# Patient Record
Sex: Female | Born: 1955
Health system: Southern US, Community
[De-identification: ages and names within clinical notes are randomized; demographics above are authoritative.]

## PROBLEM LIST (undated history)

## (undated) DIAGNOSIS — R7303 Prediabetes: Secondary | ICD-10-CM

## (undated) DIAGNOSIS — G459 Transient cerebral ischemic attack, unspecified: Secondary | ICD-10-CM

## (undated) DIAGNOSIS — K219 Gastro-esophageal reflux disease without esophagitis: Secondary | ICD-10-CM

## (undated) DIAGNOSIS — R079 Chest pain, unspecified: Secondary | ICD-10-CM

## (undated) DIAGNOSIS — I10 Essential (primary) hypertension: Secondary | ICD-10-CM

## (undated) DIAGNOSIS — R200 Anesthesia of skin: Secondary | ICD-10-CM

## (undated) DIAGNOSIS — E559 Vitamin D deficiency, unspecified: Secondary | ICD-10-CM

## (undated) DIAGNOSIS — K2970 Gastritis, unspecified, without bleeding: Secondary | ICD-10-CM

## (undated) DIAGNOSIS — K635 Polyp of colon: Secondary | ICD-10-CM

## (undated) DIAGNOSIS — I872 Venous insufficiency (chronic) (peripheral): Secondary | ICD-10-CM

## (undated) DIAGNOSIS — R5383 Other fatigue: Secondary | ICD-10-CM

## (undated) DIAGNOSIS — E669 Obesity, unspecified: Secondary | ICD-10-CM

## (undated) DIAGNOSIS — R071 Chest pain on breathing: Secondary | ICD-10-CM

## (undated) DIAGNOSIS — N959 Unspecified menopausal and perimenopausal disorder: Secondary | ICD-10-CM

## (undated) DIAGNOSIS — K2270 Barrett's esophagus without dysplasia: Secondary | ICD-10-CM

## (undated) DIAGNOSIS — M549 Dorsalgia, unspecified: Secondary | ICD-10-CM

## (undated) DIAGNOSIS — B9681 Helicobacter pylori [H. pylori] as the cause of diseases classified elsewhere: Secondary | ICD-10-CM

## (undated) DIAGNOSIS — Z8701 Personal history of pneumonia (recurrent): Secondary | ICD-10-CM

## (undated) DIAGNOSIS — R519 Headache, unspecified: Secondary | ICD-10-CM

## (undated) DIAGNOSIS — Q676 Pectus excavatum: Secondary | ICD-10-CM

## (undated) DIAGNOSIS — K5790 Diverticulosis of intestine, part unspecified, without perforation or abscess without bleeding: Secondary | ICD-10-CM

## (undated) DIAGNOSIS — S92901A Unspecified fracture of right foot, initial encounter for closed fracture: Secondary | ICD-10-CM

## (undated) DIAGNOSIS — M7989 Other specified soft tissue disorders: Secondary | ICD-10-CM

## (undated) DIAGNOSIS — I639 Cerebral infarction, unspecified: Secondary | ICD-10-CM

## (undated) HISTORY — DX: Other specified soft tissue disorders: M79.89

## (undated) HISTORY — DX: Polyp of colon: K63.5

## (undated) HISTORY — DX: Anesthesia of skin: R20.0

## (undated) HISTORY — PX: APPENDECTOMY: SHX54

## (undated) HISTORY — DX: Personal history of pneumonia (recurrent): Z87.01

## (undated) HISTORY — DX: Venous insufficiency (chronic) (peripheral): I87.2

## (undated) HISTORY — DX: Chest pain, unspecified: R07.9

## (undated) HISTORY — DX: Helicobacter pylori (H. pylori) as the cause of diseases classified elsewhere: K29.70

## (undated) HISTORY — DX: Prediabetes: R73.03

## (undated) HISTORY — DX: Other fatigue: R53.83

## (undated) HISTORY — DX: Essential (primary) hypertension: I10

## (undated) HISTORY — DX: Barrett's esophagus without dysplasia: K22.70

## (undated) HISTORY — DX: Unspecified fracture of right foot, initial encounter for closed fracture: S92.901A

## (undated) HISTORY — PX: BREAST BIOPSY: SHX20

## (undated) HISTORY — DX: Cerebral infarction, unspecified: I63.9

## (undated) HISTORY — DX: Helicobacter pylori (H. pylori) as the cause of diseases classified elsewhere: B96.81

## (undated) HISTORY — DX: Vitamin D deficiency, unspecified: E55.9

## (undated) HISTORY — DX: Diverticulosis of intestine, part unspecified, without perforation or abscess without bleeding: K57.90

## (undated) HISTORY — PX: OVARIAN CYST REMOVAL: SHX89

## (undated) HISTORY — DX: Dorsalgia, unspecified: M54.9

## (undated) HISTORY — DX: Gastro-esophageal reflux disease without esophagitis: K21.9

## (undated) HISTORY — DX: Unspecified menopausal and perimenopausal disorder: N95.9

## (undated) HISTORY — DX: Transient cerebral ischemic attack, unspecified: G45.9

## (undated) HISTORY — DX: Pectus excavatum: Q67.6

## (undated) HISTORY — PX: BREAST EXCISIONAL BIOPSY: SUR124

## (undated) HISTORY — DX: Headache, unspecified: R51.9

## (undated) HISTORY — DX: Chest pain on breathing: R07.1

## (undated) HISTORY — DX: Obesity, unspecified: E66.9

---

## 2003-06-11 ENCOUNTER — Other Ambulatory Visit: Admission: RE | Admit: 2003-06-11 | Discharge: 2003-06-11 | Payer: Self-pay | Admitting: Gynecology

## 2003-09-22 DIAGNOSIS — R0789 Other chest pain: Secondary | ICD-10-CM

## 2003-09-22 HISTORY — DX: Other chest pain: R07.89

## 2004-09-21 HISTORY — PX: CARDIAC CATHETERIZATION: SHX172

## 2004-09-21 HISTORY — PX: OTHER SURGICAL HISTORY: SHX169

## 2006-03-22 ENCOUNTER — Encounter: Admission: RE | Admit: 2006-03-22 | Discharge: 2006-04-09 | Payer: Self-pay | Admitting: Neurology

## 2006-08-17 ENCOUNTER — Ambulatory Visit: Payer: Self-pay | Admitting: Physical Medicine & Rehabilitation

## 2006-08-17 ENCOUNTER — Encounter
Admission: RE | Admit: 2006-08-17 | Discharge: 2006-11-15 | Payer: Self-pay | Admitting: Physical Medicine & Rehabilitation

## 2006-10-12 ENCOUNTER — Ambulatory Visit: Payer: Self-pay | Admitting: Physical Medicine & Rehabilitation

## 2006-10-12 ENCOUNTER — Encounter
Admission: RE | Admit: 2006-10-12 | Discharge: 2007-01-10 | Payer: Self-pay | Admitting: Physical Medicine & Rehabilitation

## 2007-01-06 ENCOUNTER — Ambulatory Visit: Payer: Self-pay | Admitting: Physical Medicine & Rehabilitation

## 2007-12-08 ENCOUNTER — Encounter: Payer: Self-pay | Admitting: Internal Medicine

## 2009-04-16 ENCOUNTER — Telehealth (INDEPENDENT_AMBULATORY_CARE_PROVIDER_SITE_OTHER): Payer: Self-pay | Admitting: *Deleted

## 2009-05-06 ENCOUNTER — Ambulatory Visit: Payer: Self-pay | Admitting: Internal Medicine

## 2009-05-06 DIAGNOSIS — R071 Chest pain on breathing: Secondary | ICD-10-CM | POA: Insufficient documentation

## 2009-05-06 DIAGNOSIS — K5730 Diverticulosis of large intestine without perforation or abscess without bleeding: Secondary | ICD-10-CM | POA: Insufficient documentation

## 2009-05-06 DIAGNOSIS — Z8709 Personal history of other diseases of the respiratory system: Secondary | ICD-10-CM | POA: Insufficient documentation

## 2009-05-06 DIAGNOSIS — I10 Essential (primary) hypertension: Secondary | ICD-10-CM | POA: Insufficient documentation

## 2009-05-06 DIAGNOSIS — Z860100 Personal history of colon polyps, unspecified: Secondary | ICD-10-CM | POA: Insufficient documentation

## 2009-05-06 DIAGNOSIS — M940 Chondrocostal junction syndrome [Tietze]: Secondary | ICD-10-CM | POA: Insufficient documentation

## 2009-07-16 ENCOUNTER — Ambulatory Visit: Payer: Self-pay | Admitting: Internal Medicine

## 2009-07-23 ENCOUNTER — Encounter: Payer: Self-pay | Admitting: Internal Medicine

## 2009-07-23 ENCOUNTER — Telehealth: Payer: Self-pay | Admitting: Internal Medicine

## 2009-10-08 ENCOUNTER — Telehealth: Payer: Self-pay | Admitting: Internal Medicine

## 2009-12-06 ENCOUNTER — Telehealth: Payer: Self-pay | Admitting: Internal Medicine

## 2010-01-15 ENCOUNTER — Encounter (INDEPENDENT_AMBULATORY_CARE_PROVIDER_SITE_OTHER): Payer: Self-pay | Admitting: *Deleted

## 2010-01-15 ENCOUNTER — Ambulatory Visit: Payer: Self-pay | Admitting: Internal Medicine

## 2010-01-15 DIAGNOSIS — K219 Gastro-esophageal reflux disease without esophagitis: Secondary | ICD-10-CM | POA: Insufficient documentation

## 2010-01-16 ENCOUNTER — Telehealth (INDEPENDENT_AMBULATORY_CARE_PROVIDER_SITE_OTHER): Payer: Self-pay | Admitting: *Deleted

## 2010-01-16 ENCOUNTER — Telehealth: Payer: Self-pay | Admitting: Internal Medicine

## 2010-03-01 ENCOUNTER — Emergency Department (HOSPITAL_COMMUNITY): Admission: EM | Admit: 2010-03-01 | Discharge: 2010-03-01 | Payer: Self-pay | Admitting: Emergency Medicine

## 2010-04-15 ENCOUNTER — Ambulatory Visit: Payer: Self-pay | Admitting: Internal Medicine

## 2010-04-24 ENCOUNTER — Ambulatory Visit: Payer: Self-pay | Admitting: Cardiovascular Disease

## 2010-05-01 ENCOUNTER — Ambulatory Visit: Payer: Self-pay | Admitting: Internal Medicine

## 2010-05-05 ENCOUNTER — Telehealth: Payer: Self-pay | Admitting: Internal Medicine

## 2010-05-05 ENCOUNTER — Ambulatory Visit: Payer: Self-pay | Admitting: Internal Medicine

## 2010-05-05 DIAGNOSIS — R609 Edema, unspecified: Secondary | ICD-10-CM | POA: Insufficient documentation

## 2010-07-07 ENCOUNTER — Ambulatory Visit: Payer: Self-pay | Admitting: Internal Medicine

## 2010-09-10 ENCOUNTER — Ambulatory Visit: Payer: Self-pay | Admitting: Internal Medicine

## 2010-09-23 ENCOUNTER — Ambulatory Visit
Admission: RE | Admit: 2010-09-23 | Discharge: 2010-09-23 | Payer: Self-pay | Source: Home / Self Care | Attending: Internal Medicine | Admitting: Internal Medicine

## 2010-09-23 DIAGNOSIS — R21 Rash and other nonspecific skin eruption: Secondary | ICD-10-CM | POA: Insufficient documentation

## 2010-09-23 DIAGNOSIS — J069 Acute upper respiratory infection, unspecified: Secondary | ICD-10-CM | POA: Insufficient documentation

## 2010-09-23 DIAGNOSIS — R05 Cough: Secondary | ICD-10-CM | POA: Insufficient documentation

## 2010-09-23 DIAGNOSIS — R059 Cough, unspecified: Secondary | ICD-10-CM | POA: Insufficient documentation

## 2010-10-19 LAB — CONVERTED CEMR LAB
Basophils Absolute: 0 10*3/uL (ref 0.0–0.1)
CO2: 27 meq/L (ref 19–32)
Calcium: 9.5 mg/dL (ref 8.4–10.5)
Chloride: 105 meq/L (ref 96–112)
Eosinophils Absolute: 0.3 10*3/uL (ref 0.0–0.7)
GFR calc non Af Amer: 110.16 mL/min (ref >60)
HCT: 43 % (ref 36.0–46.0)
Hemoglobin, Urine: NEGATIVE
Hemoglobin: 14.5 g/dL (ref 12.0–15.0)
Ketones, ur: NEGATIVE mg/dL
Leukocytes, UA: NEGATIVE
MCHC: 33.6 g/dL (ref 30.0–36.0)
MCV: 92.2 fL (ref 78.0–100.0)
Neutro Abs: 3.8 10*3/uL (ref 1.4–7.7)
Nitrite: NEGATIVE
Platelets: 345 10*3/uL (ref 150.0–400.0)
RBC: 4.66 M/uL (ref 3.87–5.11)
Sed Rate: 29 mm/hr — ABNORMAL HIGH (ref 0–22)
Sodium: 140 meq/L (ref 135–145)
Specific Gravity, Urine: >=1.03 (ref 1.000–1.030)
TSH: 0.97 microintl units/mL (ref 0.35–5.50)
Total Protein, Urine: NEGATIVE mg/dL
Total Protein: 7.5 g/dL (ref 6.0–8.3)
WBC: 7.4 10*3/uL (ref 4.5–10.5)
pH: 5.5 (ref 5.0–8.0)

## 2010-10-23 NOTE — Assessment & Plan Note (Signed)
Summary: follow up EGD/sheri   History of Present Illness Visit Type: Follow-up Visit Primary GI MD: [name] MD Primary Provider: Jacinta Shoe, MD Requesting Provider: na Chief Complaint: Paitent here to follow up after her EGD. She states that she is still having the same chest pain. She finished her Pylera treatment. History of Present Illness:   This is a 55 year old white female patient of Dr.Plotnikov who is being evaluated for intermittent sharp left-sided chest pain. An upper endoscopy was essentially normal but the biopsies showed H. pylori gastropathy as well as Barrett's esophagus, having goblet cell metaplasia at the GE junction. She has been treated for H. pylori with triple therapy and is currently on Prilosec 40 mg daily. The treatment of the H. pylori as well as of reflux did not have any impact on her chest pain which still occurs although with much less frequency. She has had only one attack in the last 4 weeks.   GI Review of Systems    Reports chest pain.      Denies abdominal pain, acid reflux, belching, bloating, dysphagia with liquids, dysphagia with solids, heartburn, loss of appetite, nausea, vomiting, vomiting blood, weight loss, and  weight gain.        Denies anal fissure, black tarry stools, change in bowel habit, constipation, diarrhea, diverticulosis, fecal incontinence, heme positive stool, hemorrhoids, irritable bowel syndrome, jaundice, light color stool, liver problems, rectal bleeding, and  rectal pain.    Current Medications (verified): 1)  Vitamin D3 1000 Unit  Tabs (Cholecalciferol) .[name].. 1 By Mouth Daily 2)  Amlodipine Besylate 5 Mg Tabs (Amlodipine Besylate) .[name].. 1 By Mouth Once Daily As Needed 3)  Triamcinolone Acetonide 0.5 % Crea (Triamcinolone Acetonide) .... Use Two Times A Day Prn 4)  Pantoprazole Sodium 40 Mg Tbec (Pantoprazole Sodium) .... Take One By Mouth Once Daily  Allergies (verified): 1)  ! Penicillin G Potassium (Penicillin G  Potassium) 2)  Metoprolol Tartrate (Metoprolol Tartrate)  Past History:  Past Medical History: Hypertension L CP - possible costochondritis Venous insuff B LE Menopausal after 52  Dr Rana Snare GI Dr Bosie Clos colon 2009 Colonic polyps, hx of Diverticulosis, colon Syncope - vaso-vagal Pneumonia, hx of GERD Dr Juanda Chance  EGD 2011 WNL Obesity Pectus excavatum R foot fracture  Dr Andi Devon.PYLORIC GASTRITIS  Past Surgical History: Reviewed history from 04/15/2010 and no changes required. Appendectomy Caesarean section L ovar cyst resection (?) Cardiac cath  Family History: Reviewed history from 04/15/2010 and no changes required. F CAD 49 S HTN Family History of Prostate Cancer: father, paternal grandfather Family History of Stomach Cancer: paternal uncle dx around 6 yrs old Family History of Colon Cancer: paternal uncle  dx around 73 yrs old Family History of Diabetes: mother, maternal aunt Family History of Heart Disease: maternal grandmother, father paternal grandmother: stroke  Social History: Reviewed history from 04/15/2010 and no changes required. 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  The patient denies allergy/sinus, anemia, anxiety-new, blood in urine, breast changes/lumps, change in vision, confusion, cough, coughing up blood, depression-new, fainting, fatigue, fever, headaches-new, hearing problems, heart murmur, heart rhythm changes, itching, menstrual pain, muscle pains/cramps, night sweats, nosebleeds, pregnancy symptoms, shortness of breath, skin rash, sleeping problems, sore throat, swelling of feet/legs, swollen lymph glands, thirst - excessive , urination - excessive , urination changes/pain, urine leakage, vision changes,  and voice change.         Pertinent positive and negative review of systems  were noted in the above HPI. All other ROS was otherwise negative.   Vital Signs:  Patient profile:   55 year old female Height:      62 inches Weight:      185.6 pounds BMI:     34.07 Pulse rate:   80 / minute Pulse rhythm:   regular BP sitting:   130 / 68  (left arm) Cuff size:   regular  Vitals Entered By: Harlow Mares CMA Duncan Dull) (July 07, 2010 1:48 PM)  Physical Exam  General:  Well developed, well nourished, no acute distress. Mouth:  No deformity or lesions, dentition normal. Neck:  Supple; no masses or thyromegaly. Chest Wall:  no point tenderness no rales or wheezes Lungs:  Clear throughout to auscultation. Heart:  Regular rate and rhythm; no murmurs, rubs,  or bruits.   Impression & Recommendations:  Problem # 1:  CHEST WALL PAIN (ICD-786.52) chest pain has decreased in frequency  Problem # 2:  GERD (ICD-530.81) new diagnoses Barrett's esophagus and H.Pylori  gastropathy was treated. She needs to continue on Protonix 40 mg daily and have repeat upper endoscopy in 2 years  Patient Instructions: 1)  Protonix 40 mg daily 2)  A recall upper endoscopy will be due in 2 years. 3)  Copy sent to : Dr Posey Rea 4)  The medication list was reviewed and reconciled.  All changed / newly prescribed medications were explained.  A complete medication list was provided to the patient / caregiver.

## 2010-10-23 NOTE — Letter (Signed)
Summary: Optometrist Release   Imported By: Sherian Rein 01/17/2010 12:07:49  _____________________________________________________________________  External Attachment:    Type:   Image     Comment:   External Document

## 2010-10-23 NOTE — Letter (Signed)
Summary: New Patient letter  [facility]  [address] [place], [place] [address]   Phone: [phone]  Fax: [phone]       01/15/2010 MRN: [id number]  [facility] [address] [place], [place]  [address]  Dear Ms. Canova,  Welcome to the Gastroenterology Division at [facility].    You are scheduled to see Dr.  [name] on February 24, 2010 at 9:15am on the 3rd floor at [facility], [address]  We ask that you try to arrive at our office 15 minutes prior to your appointment time to allow for check-in.  We would like you to complete the enclosed self-administered evaluation form prior to your visit and bring it with you on the day of your appointment.  We will review it with you.  Also, please bring a complete list of all your medications or, if you prefer, bring the medication bottles and we will list them.  Please bring your insurance card so that we may make a copy of it.  If your insurance requires a referral to see a specialist, please bring your referral form from your primary care physician.  Co-payments are due at the time of your visit and may be paid by cash, check or credit card.     Your office visit will consist of a consult with your physician (includes a physical exam), any laboratory testing he/she may order, scheduling of any necessary diagnostic testing (e.g. x-ray, ultrasound, CT-scan), and scheduling of a procedure (e.g. Endoscopy, Colonoscopy) if required.  Please allow enough time on your schedule to allow for any/all of these possibilities.    If you cannot keep your appointment, please call [phone] to cancel or reschedule prior to your appointment date.  This allows [place] the opportunity to schedule an appointment for another patient in need of care.  If you do not cancel or reschedule by 5 p.m. the business day prior to your appointment date, you will be charged a $50.00 late cancellation/no-show fee.    Thank  you for choosing [facility] for your medical needs.  We appreciate the opportunity to care for you.  Please visit [place] at our website  to learn more about our practice.                     Sincerely,                                                             The Gastroenterology Division

## 2010-10-23 NOTE — Progress Notes (Signed)
  Phone Note Other Incoming   Summary of Call: Side effects w/BP med Initial call taken by: Tresa Garter MD,  October 08, 2009 3:29 PM  Follow-up for Phone Call        Try amlodipine RTC 2 mo Follow-up by: Tresa Garter MD,  October 08, 2009 3:29 PM    New/Updated Medications: AMLODIPINE BESYLATE 5 MG TABS (AMLODIPINE BESYLATE) 1 by mouth qd Prescriptions: AMLODIPINE BESYLATE 5 MG TABS (AMLODIPINE BESYLATE) 1 by mouth qd  #30 x 12   Entered and Authorized by:   Tresa Garter MD   Signed by:   Tresa Garter MD on 10/08/2009   Method used:   Print then Give to Patient   RxID:   [id number]

## 2010-10-23 NOTE — Assessment & Plan Note (Signed)
Summary: cough,cold./sore throat/cd   Vital Signs:  Patient profile:   55 year old female Height:      62 inches Weight:      186 pounds BMI:     34.14 Temp:     98.1 degrees F oral Pulse rate:   88 / minute Pulse rhythm:   regular Resp:     16 per minute BP sitting:   164 / 98  (left arm) Cuff size:   regular  Vitals Entered By: Lanier Prude, CMA(AAMA) (September 23, 2010 3:38 PM) CC: cough, fatigue X 2 wks Is Patient Diabetic? No   Primary Care Provider:  Jacinta Shoe, MD  CC:  cough and fatigue X 2 wks.  History of Present Illness: The patient presents with complaints of sore throat, fever, cough, sinus congestion and drainge of 10 days duration. Not better with OTC meds. Chest hurts with coughing. Can't sleep due to cough. Muscle aches are present.  The mucus is colored.   Current Medications (verified): 1)  Vitamin D3 1000 Unit  Tabs (Cholecalciferol) .[name].. 1 By Mouth Daily 2)  Amlodipine Besylate 5 Mg Tabs (Amlodipine Besylate) .[name].. 1 By Mouth Once Daily As Needed 3)  Triamcinolone Acetonide 0.5 % Crea (Triamcinolone Acetonide) .... Use Two Times A Day Prn 4)  Pantoprazole Sodium 40 Mg Tbec (Pantoprazole Sodium) .... Take One By Mouth Once Daily  Allergies (verified): 1)  ! 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    Physical Exam  General:  overweight-appearing.   Head:  Normocephalic and atraumatic without obvious abnormalities. No apparent alopecia or balding. Ears:  External ear exam shows no significant lesions or deformities.  Otoscopic examination reveals clear canals, tympanic membranes are intact bilaterally without bulging, retraction, inflammation or discharge. Hearing is grossly normal bilaterally. Nose:  External nasal examination shows no deformity or inflammation. Nasal mucosa are pink and moist without lesions or exudates. Mouth:  Oral mucosa and oropharynx without lesions or exudates.  Teeth in good repair. Neck:  No deformities, masses, or tenderness noted. Chest Wall:  No deformities, masses, or tenderness noted. Lungs:  Normal respiratory effort, chest expands symmetrically. Lungs are clear to auscultation, no crackles or wheezes. Heart:  Normal rate and regular rhythm. S1 and S2 normal without gallop, murmur, click, rub or other extra sounds. Abdomen:  Bowel sounds positive,abdomen soft, obese and non-tender without masses, organomegaly or hernias noted. Msk:  No deformity or scoliosis noted of thoracic or lumbar spine.  B calves NT Pulses:  R and L carotid,radial,femoral,dorsalis pedis and posterior tibial pulses are full and equal bilaterally Extremities:  trace right pedal edema.   Neurologic:  No cranial nerve deficits noted. Station and gait are normal. Plantar reflexes are down-going bilaterally. DTRs are symmetrical throughout. Sensory, motor and coordinative functions appear intact. Skin:  faint rash on elbows Psych:  Cognition and judgment appear intact. Alert and cooperative  with normal attention span and concentration. No apparent delusions, illusions, hallucinations   Impression & Recommendations:  Problem # 1:  COUGH (ICD-786.2) due to URI Assessment New  Orders: T-2 View CXR, Same Day (71020.5TC)  Problem # 2:  SKIN RASH  (ICD-782.1) Assessment: New  Her updated medication list for this problem includes:    Triamcinolone Acetonide 0.5 % Crea (Triamcinolone acetonide) ..... Use two times a day prn  Problem # 3:  UPPER RESPIRATORY INFECTION, ACUTE (ICD-465.9) Assessment: New Zpac Her updated medication list for this problem includes:    Tessalon Perles 100 Mg Caps (Benzonatate) .[name]... 1-2 by mouth two times a day as needed cogh    Promethazine-codeine 6.25-10 Mg/9ml Syrp (Promethazine-codeine) .[name]... 5-10 ml by mouth q id as needed cough  Problem # 4:  EDEMA (ICD-782.3) Assessment: Improved  Complete Medication List: 1)  Vitamin D3 1000 Unit Tabs (Cholecalciferol) .[name].. 1 by mouth daily 2)  Amlodipine Besylate 5 Mg Tabs (Amlodipine besylate) .[name].. 1 by mouth once daily as needed 3)  Triamcinolone Acetonide 0.5 % Crea (Triamcinolone acetonide) .... Use two times a day prn 4)  Pantoprazole Sodium 40 Mg Tbec (Pantoprazole sodium) .... Take one by mouth once daily 5)  Zithromax Z-pak 250 Mg Tabs (Azithromycin) .... As dirrected 6)  Tessalon Perles 100 Mg Caps (Benzonatate) .[name].. 1-2 by mouth two times a day as needed cogh 7)  Promethazine-codeine 6.25-10 Mg/16ml Syrp (Promethazine-codeine) .... 5-10 ml by mouth q id as needed cough  Patient Instructions: 1)  Use over-the-counter medicines for "cold": Tylenol  650mg  or Advil 400mg  every 6 hours  for fever; Delsym or Robutussin for cough. Mucinex or Mucinex D for congestion. Ricola or Halls for sore throat. Office visit if not better or if worse.  Prescriptions: PROMETHAZINE-CODEINE 6.25-10 MG/5ML SYRP (PROMETHAZINE-CODEINE) 5-10 ml by mouth q id as needed cough  #300 ml x 0   Entered and Authorized by:   Tresa Garter MD   Signed by:   Tresa Garter MD on 09/23/2010   Method used:   Print then Give to Patient   RxID:   [id number] TESSALON PERLES 100 MG CAPS (BENZONATATE) 1-2 by mouth two times a day as needed cogh  #120 x 1   Entered and  Authorized by:   Tresa Garter MD   Signed by:   Tresa Garter MD on 09/23/2010   Method used:   Electronically to        [facility] [address] (retail)       [phone]       [place], [place]  [address]       Ph: [phone]       Fax: [phone]   RxID:   [id number] ZITHROMAX Z-PAK 250 MG TABS (AZITHROMYCIN) as dirrected  #1 x 0   Entered and Authorized by:   Tresa Garter MD   Signed by:   Tresa Garter MD on 09/23/2010   Method used:   Electronically to        [facility] [address] (retail)       [phone]       [place], [place]  [address]       Ph: [phone]       Fax: [phone]   RxID:   [id number]    Orders Added: 1)  T-2 View CXR, Same Day [71020.5TC] 2)  Est.  Patient Level IV [11914]

## 2010-10-23 NOTE — Progress Notes (Signed)
  Phone Note Call from Patient   Summary of Call: rash on hand  Follow-up for Phone Call        [facility] Follow-up by: Tresa Garter MD,  December 06, 2009 4:09 PM    New/Updated Medications: TRIAMCINOLONE ACETONIDE 0.5 % CREA (TRIAMCINOLONE ACETONIDE) use two times a day prn Prescriptions: TRIAMCINOLONE ACETONIDE 0.5 % CREA (TRIAMCINOLONE ACETONIDE) use two times a day prn  #60 g x 3   Entered and Authorized by:   Tresa Garter MD   Signed by:   Tresa Garter MD on 12/06/2009   Method used:   Electronically to        [facility] [address] (retail)       [phone]       [place], [place]  [address]       Ph: [phone]       Fax: [phone]   RxID:   [id number]

## 2010-10-23 NOTE — Procedures (Signed)
Summary: Colonoscopy/[facility]  Colonoscopy/[facility]   Imported By: Sherian Rein 04/17/2010 08:17:27  _____________________________________________________________________  External Attachment:    Type:   Image     Comment:   External Document

## 2010-10-23 NOTE — Assessment & Plan Note (Signed)
Summary: 3 mo rov /nws   Vital Signs:  Patient profile:   55 year old female Height:      62 inches Weight:      183 pounds BMI:     33.59 O2 Sat:      96 % on Room air Temp:     98.3 degrees F oral Pulse rate:   82 / minute Pulse rhythm:   regular Resp:     16 per minute BP sitting:   120 / 88  (left arm) Cuff size:   regular  Vitals Entered By: Lanier Prude, CMA(AAMA) (May 05, 2010 11:11 AM)  O2 Flow:  Room air CC: 3 mo f/u Is Patient Diabetic? No   Primary Care Provider:  Jacinta Shoe, MD  CC:  3 mo f/u.  History of Present Illness: F/u HTN, CP, GERD She broke R anklle, healing now  Current Medications (verified): 1)  Vitamin D3 1000 Unit  Tabs (Cholecalciferol) .[name].. 1 By Mouth Daily 2)  Amlodipine Besylate 5 Mg Tabs (Amlodipine Besylate) .[name].. 1 By Mouth Once Daily As Needed 3)  Triamcinolone Acetonide 0.5 % Crea (Triamcinolone Acetonide) .... Use Two Times A Day Prn 4)  Levsin 0.125 Mg Tabs (Hyoscyamine Sulfate) .[name].. 1-2 By Mouth Qid As Needed Spasm 5)  Pantoprazole Sodium 40 Mg Tbec (Pantoprazole Sodium) .[name].. 1 By Mouth Once Daily For Indigestion As Needed  Allergies (verified): 1)  ! 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   Head:  normocephalic.   Mouth:  Oral mucosa and oropharynx without lesions or exudates.  Teeth in good repair. Neck:  No deformities, masses, or tenderness noted. Chest Wall:  No deformities, masses, or tenderness noted. Lungs:  Normal respiratory effort, chest expands symmetrically. Lungs are clear to auscultation, no crackles or wheezes. Heart:  Normal rate and regular rhythm. S1 and S2 normal without gallop, murmur, click, rub or other extra sounds. Abdomen:  Bowel sounds positive,abdomen soft, obese and non-tender without masses, organomegaly or hernias noted. Msk:  No deformity or scoliosis noted of thoracic or lumbar spine.  B calves NT Extremities:  1+ right pedal edema.   Neurologic:  No cranial nerve deficits noted. Station and gait are normal. Plantar reflexes are down-going bilaterally. DTRs are symmetrical throughout. Sensory, motor and coordinative functions appear intact. Skin:  Intact without suspicious lesions or rashes Psych:  Cognition and judgment appear intact. Alert and cooperative with normal attention span and concentration. No apparent delusions, illusions, hallucinations   Impression & Recommendations:  Problem # 1:  HYPERTENSION (ICD-401.9) Assessment Unchanged Rechecked BP 160/100 Risks of noncompliance with treatment discussed. Compliance encouraged.  Her updated medication list for this problem includes:    Amlodipine Besylate 5 Mg Tabs (Amlodipine besylate) .[name]... 1 by mouth once daily as needed - she has  not been taking it regular  Problem # 2:  GERD (ICD-530.81) no sx  Assessment: Improved She does not want to take meds. EGD was nl. The following medications were removed from the medication list:    Levsin 0.125 Mg Tabs (Hyoscyamine sulfate) .[name]... 1-2 by mouth qid as needed spasm    Pantoprazole Sodium 40 Mg Tbec (Pantoprazole sodium) .[name]... 1 by mouth once daily for indigestion as needed  Problem # 3:  CHEST WALL  PAIN (ZOX-096.04) - costochondritis Assessment: Unchanged Discussed Dr Regino Schultze help is very appreciated The pt is not interested in any extra intervention now  Problem # 4:  EDEMA (ICD-782.3) RLE due to fx and venous insuff Assessment: New Elevate leg qid !  Complete Medication List: 1)  Vitamin D3 1000 Unit Tabs (Cholecalciferol) .[name].. 1 by mouth daily 2)  Amlodipine Besylate 5 Mg Tabs (Amlodipine besylate) .[name].. 1 by mouth once daily as needed 3)  Triamcinolone Acetonide 0.5 % Crea (Triamcinolone acetonide) .... Use two times a day prn  Patient Instructions: 1)  Please schedule a follow-up appointment in 3 months. 2)  Elevate R leg every 4 hrs for 15 min

## 2010-10-23 NOTE — Letter (Signed)
Summary: EGD Instructions  [facility]  [address] [place], [place] [address]   Phone: [phone]  Fax: [phone]       Denise Donaldson    [birth year]    MRN: [id number]       Procedure Day /Date: 05/01/10 Thursday     Arrival Time: 2:00 pm     Procedure Time: 3:00 pm     Location of Procedure:                    _ x _ [facility] (4th Floor)  PREPARATION FOR ENDOSCOPY   On 05/01/10 THE DAY OF THE PROCEDURE:  1.   No solid foods, milk or milk products are allowed after midnight the night before your procedure.  2.   Do not drink anything colored red or purple.  Avoid juices with pulp.  No orange juice.  3.  You may drink clear liquids until 1:00 pm, which is 2 hours before your procedure.                                                                                                CLEAR LIQUIDS INCLUDE: Water Jello Ice Popsicles Tea (sugar ok, no milk/cream) Powdered fruit flavored drinks Coffee (sugar ok, no milk/cream) Gatorade Juice: apple, white grape, white cranberry  Lemonade Clear bullion, consomm, broth Carbonated beverages (any kind) Strained chicken noodle soup Hard Candy   MEDICATION INSTRUCTIONS  Unless otherwise instructed, you should take regular prescription medications with a small sip of water as early as possible the morning of your procedure.                   OTHER INSTRUCTIONS  You will need a responsible adult at least 57 years of age to accompany you and drive you home.   This person must remain in the waiting room during your procedure.  Wear loose fitting clothing that is easily removed.  Leave jewelry and other valuables at home.  However, you may wish to bring a book to read or an iPod/MP3 player to listen to music as you wait for your procedure to start.  Remove all body piercing jewelry and leave at home.  Total time from sign-in until discharge is approximately 2-3 hours.  You  should go home directly after your procedure and rest.  You can resume normal activities the day after your procedure.  The day of your procedure you should not:   Drive   Make legal decisions   Operate machinery   Drink alcohol   Return to work  You will receive specific instructions about eating, activities and medications before you leave.    The above instructions have been reviewed and explained to me by  Lamona Curl CMA Duncan Dull)  April 15, 2010 3:37 PM     I fully understand and can verbalize these instructions _____________________________ Date 04/15/10

## 2010-10-23 NOTE — Progress Notes (Signed)
Summary: Returning nurses call  Phone Note Call from Patient Call back at [phone]   Call For: Dr Juanda Chance Reason for Call: Talk to Nurse Summary of Call: Says she is returning the nurse's call but I do not see a note anywhere. Says it was on her voice mail. Initial call taken by: Leanor Kail [facility],  May 05, 2010 2:47 PM  Follow-up for Phone Call        Left message for patient to call back Follow-up by: Darcey Nora RN, CGRN,  May 05, 2010 2:52 PM  Additional Follow-up for Phone Call Additional follow up Details #1::        Patient  has an allergy to PCN, I will send her in RX for Pylera.  Patient  is also advised that she will need to increase her Pantoprazole to BID while on Pylera.   she says she hasn't been taking this.  I will send her a new RX.  REV scheduled for 07/07/10 3:45.  She has requested I send her copies of her path results.  I will mail her also a pamphlet on Barrett's.    New/Updated Medications: PYLERA 140-125-125 MG CAPS (BIS SUBCIT-METRONID-TETRACYC) 3 by mouth qid PANTOPRAZOLE SODIUM 40 MG TBEC (PANTOPRAZOLE SODIUM) 1 by mouth two times a day x 10 days, then qd Prescriptions: PANTOPRAZOLE SODIUM 40 MG TBEC (PANTOPRAZOLE SODIUM) 1 by mouth two times a day x 10 days, then qd  #40 x 11   Entered by:   Darcey Nora RN, CGRN   Authorized by:   Hart Carwin MD   Signed by:   Darcey Nora RN, CGRN on 05/05/2010   Method used:   Electronically to        [facility] [address] (retail)       [phone]       [place], [place]  [address]       Ph: [phone]       Fax: [phone]   RxID:   [id number] VOZDGU [id number] MG CAPS (BIS SUBCIT-METRONID-TETRACYC) 3 by mouth qid  #120 x 0   Entered by:   Darcey Nora RN, CGRN   Authorized by:   Hart Carwin MD   Signed by:   Darcey Nora RN, CGRN on 05/05/2010   Method used:   Electronically to        [facility] [address] (retail)       [phone]    [place], [place]  [address]       Ph: [phone]       Fax: [phone]   RxID:   [id number]  ---- 05/05/2010 1:13 PM, Hart Carwin MD wrote: Harriet Masson, I have left a message on pt's phone to call concerning H.Pylori and Barrett's treatment. Please send Prevpak or Pylera whatever is more convenient.She will have to stay on PPI 1x/day afterwards.  OV 8 weeks.

## 2010-10-23 NOTE — Progress Notes (Signed)
Summary: PHARM ?  Phone Note From Pharmacy   Caller: [facility] [address] Summary of Call: Rx for Levsin 0.125 received by pharmacy, however they state that this is a nonabrated generic and would like to know if they can fill with hyoscyamine. Please advise  new rx can be sent  to [facility] Initial call taken by: Rock Nephew CMA,  January 16, 2010 8:54 AM  Follow-up for Phone Call        Hhyoscamine is OK Follow-up by: Tresa Garter MD,  January 16, 2010 11:57 AM  Additional Follow-up for Phone Call Additional follow up Details #1::        Left mess for pharm  Additional Follow-up by: Lamar Sprinkles, CMA,  January 16, 2010 1:43 PM

## 2010-10-23 NOTE — Progress Notes (Signed)
Summary: Nexium pa  Phone Note From Pharmacy   Summary of Call: PA request--Nexium. Preferred products are omeprazole and pantoprazole. Please advise. Initial call taken by: Lucious Groves,  January 16, 2010 3:54 PM  Follow-up for Phone Call        ok Pantaprazol Follow-up by: Tresa Garter MD,  January 17, 2010 8:02 AM    New/Updated Medications: PANTOPRAZOLE SODIUM 40 MG TBEC (PANTOPRAZOLE SODIUM) 1 by mouth once daily for indigestion Prescriptions: PANTOPRAZOLE SODIUM 40 MG TBEC (PANTOPRAZOLE SODIUM) 1 by mouth once daily for indigestion  #30 x 12   Entered and Authorized by:   Tresa Garter MD   Signed by:   Bill Salinas CMA on 01/17/2010   Method used:   Electronically to        [facility] [address] (retail)       [phone]       [place], [place]  [address]       Ph: [phone]       Fax: [phone]   RxID:   [id number]

## 2010-10-23 NOTE — Assessment & Plan Note (Signed)
Summary: 6 MOROV /NWS   Vital Signs:  Patient profile:   55 year old female Height:      62 inches Weight:      179.25 pounds BMI:     32.90 O2 Sat:      97 % on Room air Temp:     98.0 degrees F oral Pulse rate:   91 / minute BP sitting:   130 / 72  (left arm) Cuff size:   regular  Vitals Entered By: Lucious Groves (January 15, 2010 2:59 PM)  O2 Flow:  Room air CC: 6 mo rtn ov./kb Is Patient Diabetic? No Pain Assessment Patient in pain? no        CC:  6 mo rtn ov./kb.  History of Present Illness: C/o CP, fullness, worse w/bending and after meals x 2-3 years + F/u HTN - better  Current Medications (verified): 1)  Vitamin D3 1000 Unit  Tabs (Cholecalciferol) .[name].. 1 By Mouth Daily 2)  Amlodipine Besylate 5 Mg Tabs (Amlodipine Besylate) .[name].. 1 By Mouth Qd 3)  Triamcinolone Acetonide 0.5 % Crea (Triamcinolone Acetonide) .... Use Two Times A Day Prn  Allergies (verified): 1)  ! Penicillin G Potassium (Penicillin G Potassium) 2)  Metoprolol Tartrate (Metoprolol Tartrate)  Past History:  Social History: Last updated: 05/06/2009 Occupation:driver Married 3 children Never Smoked Drug use-yes Regular exercise-no  Past Medical History: Hypertension L CP - possible costochondritis Venous insuff B LE Menopausal after 52  Dr Rana Snare GI Dr ? colon 2009 Colonic polyps, hx of Diverticulosis, colon Syncope - vaso-vagal Pneumonia, hx of GERD  Review of Systems  The patient denies fever, dyspnea on exertion, peripheral edema, and abdominal pain.    Physical Exam  General:  overweight-appearing.   Nose:  External nasal examination shows no deformity or inflammation. Nasal mucosa are pink and moist without lesions or exudates. Mouth:  Oral mucosa and oropharynx without lesions or exudates.  Teeth in good repair. Lungs:  Normal respiratory effort, chest expands symmetrically. Lungs are clear to auscultation, no crackles or wheezes. Heart:  Normal rate and regular rhythm. S1  and S2 normal without gallop, murmur, click, rub or other extra sounds. Abdomen:  Bowel sounds positive,abdomen soft, obese and non-tender without masses, organomegaly or hernias noted. Msk:  No deformity or scoliosis noted of thoracic or lumbar spine.   Extremities:  No clubbing, cyanosis, edema, or deformity noted with normal full range of motion of all joints.   Neurologic:  No cranial nerve deficits noted. Station and gait are normal. Plantar reflexes are down-going bilaterally. DTRs are symmetrical throughout. Sensory, motor and coordinative functions appear intact. Skin:  Intact without suspicious lesions or rashes Psych:  Cognition and judgment appear intact. Alert and cooperative with normal attention span and concentration. No apparent delusions, illusions, hallucinations   Impression & Recommendations:  Problem # 1:  HYPERTENSION (ICD-401.9) Assessment Improved  Her updated medication list for this problem includes:    Amlodipine Besylate 5 Mg Tabs (Amlodipine besylate) .[name]... 1 by mouth qd  Problem # 2:  GERD (ICD-530.81) caused CP - poss HH Assessment: Comment Only  Her updated medication list for this problem includes:    Nexium 40 Mg Cpdr (Esomeprazole magnesium) .[name]... 1 by mouth qam ( medically necessary)    Levsin 0.125 Mg Tabs (Hyoscyamine sulfate) .[name]... 1-2 by mouth qid as needed spasm  Orders: Gastroenterology Referral (GI)  Problem # 3:  COSTOCHONDRITIS, RECURRENT (ICD-733.6) related to #2 Assessment: Comment Only  Orders: Gastroenterology Referral (GI)  Complete  Medication List: 1)  Vitamin D3 1000 Unit Tabs (Cholecalciferol) .[name].. 1 by mouth daily 2)  Amlodipine Besylate 5 Mg Tabs (Amlodipine besylate) .[name].. 1 by mouth qd 3)  Triamcinolone Acetonide 0.5 % Crea (Triamcinolone acetonide) .... Use two times a day prn 4)  Nexium 40 Mg Cpdr (Esomeprazole magnesium) .[name].. 1 by mouth qam ( medically necessary) 5)  Levsin 0.125 Mg Tabs (Hyoscyamine sulfate) .[name].. 1-2 by  mouth qid as needed spasm  Patient Instructions: 1)  Please schedule a follow-up appointment in 3 months. Prescriptions: LEVSIN 0.125 MG TABS (HYOSCYAMINE SULFATE) 1-2 by mouth qid as needed spasm  #60 x 2   Entered and Authorized by:   Tresa Garter MD   Signed by:   Tresa Garter MD on 01/15/2010   Method used:   Electronically to        [facility] [address] (retail)       [phone]       [place], [place]  [address]       Ph: [phone]       Fax: [phone]   RxID:   [id number] NEXIUM 40 MG CPDR (ESOMEPRAZOLE MAGNESIUM) 1 by mouth qam ( medically necessary)  #30 x 12   Entered and Authorized by:   Tresa Garter MD   Signed by:   Tresa Garter MD on 01/15/2010   Method used:   Electronically to        [facility] [address] (retail)       [phone]       [place], [place]  [address]       Ph: [phone]       Fax: [phone]   RxID:   [id number]

## 2010-10-23 NOTE — Procedures (Signed)
Summary: Upper Endoscopy  Patient: [name] Note: All result statuses are Final unless otherwise noted.  Tests: (1) Upper Endoscopy (EGD)   EGD Upper Endoscopy       DONE     [facility]     [address]     [place], [place]  [address]           ENDOSCOPY PROCEDURE REPORT           PATIENT:  Denise, Donaldson  MR#:  [id number]     BIRTHDATE:  07-29-1956, 54 yrs. old  GENDER:  female           ENDOSCOPIST:  Hedwig Morton. Juanda Chance, MD     Referred by:  Linda Hedges Plotnikov, M.D.           PROCEDURE DATE:  05/01/2010     PROCEDURE:  EGD with biopsy     ASA CLASS:  Class I     INDICATIONS:  chest pain noncardiac chest pain, see office note     from 04/15/2010           MEDICATIONS:   Versed 9 mg, Fentanyl 75 mcg     TOPICAL ANESTHETIC:  Exactacain Spray           DESCRIPTION OF PROCEDURE:   After the risks benefits and     alternatives of the procedure were thoroughly explained, informed     consent was obtained.  [facility] [id number] endoscope was     introduced through the mouth and advanced to the second portion of     the duodenum, without limitations.  The instrument was slowly     withdrawn as the mucosa was fully examined.     <<PROCEDUREIMAGES>>           The upper, middle, and distal third of the esophagus were     carefully inspected and no abnormalities were noted. The z-line     was well seen at the GEJ. The endoscope was pushed into the fundus     which was normal including a retroflexed view. The antrum,gastric     body, first and second part of the duodenum were unremarkable.     With standard forceps, a biopsy was obtained and sent to pathology     (see image1, image2, image3, image4, and image5). gastric abtrum     biopsies and g-e junction biopsies    Retroflexed views revealed     no abnormalities.    The scope was then withdrawn from the patient     and the procedure completed.           COMPLICATIONS:  None           ENDOSCOPIC  IMPRESSION:     1) Normal EGD     nothing to account for chest pain     s/p gastric and esophageal biopsies     RECOMMENDATIONS:     1) Await biopsy results           REPEAT EXAM:  In 0 year(s) for.           ______________________________     Hedwig Morton. Juanda Chance, MD           CC:           n.     eSIGNED:   Hedwig Morton. Brodie at 05/01/2010 03:44 PM           Denise Donaldson, Denise Donaldson, [id number]  Note: An exclamation mark [name])  indicates a result that was not dispersed into the flowsheet. Document Creation Date: 05/01/2010 3:45 PM _______________________________________________________________________  (1) Order result status: Final Collection or observation date-time: 05/01/2010 15:36 Requested date-time:  Receipt date-time:  Reported date-time:  Referring Physician:   Ordering Physician: [name] [phone]) Specimen Source:  Source: Launa Grill Order Number: [id number] Lab site:   Appended Document: Upper Endoscopy     Procedures Next Due Date:    EGD: 04/2012

## 2010-10-23 NOTE — Assessment & Plan Note (Signed)
Summary: gerd///em   History of Present Illness Visit Type: Initial Consult Primary GI MD: [name] MD Primary Provider: Jacinta Shoe, MD Requesting Provider: Jacinta Shoe, MD Chief Complaint: Patient complains of chest pain for about 14 years she has had a cardiac cath. She has been cleared by cardiology. The pain is severe at times and sudden it takes her breath away. She has not noticed a difference since starting Protonix.  History of Present Illness:   This is a 55 year old white female who is from [place], and comes today complaining of substernal chest pains intermittently for the past 14-16 years. Episodes last any from a few seconds to several minutes and occur several times a week or once a month. They are precipitated by movement, reaching to the to the side on the left or the right and they are rather sudden and severe. Patient has to stop in the middle of the breath and hold her breath until the pain subsides.She says that she "freezes" when the pain hits .  It has occurred on several occasions at night .Iit is not associated with eating or swallowing. Separate from that complain  , she has left costal margin discomfort and sometimes bloating as well as some problems with chewing and swallowing. She had a screening colonoscopy in 2009 which showed mild diverticulosis of the left colon and a colon polyp which was hyperplastic. She had a negative cardiology evaluation by Dr. Tresa Endo including a cardiac catheterization .She does not cough and does not smoke. She has never had an injury to her chest.   GI Review of Systems    Reports chest pain.      Denies abdominal pain, acid reflux, belching, bloating, dysphagia with liquids, dysphagia with solids, heartburn, loss of appetite, nausea, vomiting, vomiting blood, weight loss, and  weight gain.        Denies anal fissure, black tarry stools, change in bowel habit, constipation, diarrhea, diverticulosis, fecal incontinence, heme  positive stool, hemorrhoids, irritable bowel syndrome, jaundice, light color stool, liver problems, rectal bleeding, and  rectal pain.    Current Medications (verified): 1)  Vitamin D3 1000 Unit  Tabs (Cholecalciferol) .[name].. 1 By Mouth Daily 2)  Amlodipine Besylate 5 Mg Tabs (Amlodipine Besylate) .[name].. 1 By Mouth Once Daily As Needed 3)  Triamcinolone Acetonide 0.5 % Crea (Triamcinolone Acetonide) .... Use Two Times A Day Prn 4)  Levsin 0.125 Mg Tabs (Hyoscyamine Sulfate) .[name].. 1-2 By Mouth Qid As Needed Spasm 5)  Pantoprazole Sodium 40 Mg Tbec (Pantoprazole Sodium) .[name].. 1 By Mouth Once Daily For Indigestion As Needed  Allergies (verified): 1)  ! Penicillin G Potassium (Penicillin G Potassium) 2)  Metoprolol Tartrate (Metoprolol Tartrate)  Past History:  Past Medical History: Hypertension L CP - possible costochondritis Venous insuff B LE Menopausal after 28  Dr Rana Snare GI Dr Bosie Clos colon 2009 Colonic polyps, hx of Diverticulosis, colon Syncope - vaso-vagal Pneumonia, hx of GERD Obesity  Past Surgical History: Appendectomy Caesarean section L ovar cyst resection (?) Cardiac cath  Family History: F CAD 64 S HTN Family History of Prostate Cancer: father, paternal grandfather Family History of Stomach Cancer: paternal uncle dx around 39 yrs old Family History of Colon Cancer: paternal uncle  dx around 50 yrs old Family History of Diabetes: mother, maternal aunt Family History of Heart Disease: maternal grandmother, father paternal grandmother: stroke  Social History: 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  The patient denies allergy/sinus, anemia, anxiety-new, blood in urine, breast changes/lumps, change in vision, confusion, cough, coughing up blood, depression-new, fainting, fatigue, fever, headaches-new,  hearing problems, heart murmur, heart rhythm changes, itching, menstrual pain, muscle pains/cramps, night sweats, nosebleeds, pregnancy symptoms, shortness of breath, skin rash, sleeping problems, sore throat, swelling of feet/legs, swollen lymph glands, thirst - excessive , urination - excessive , urination changes/pain, urine leakage, vision changes, and voice change.         Pertinent positive and negative review of systems were noted in the above HPI. All other ROS was otherwise negative.   Vital Signs:  Patient profile:   55 year old female Height:      62 inches Weight:      183.4 pounds BMI:     33.67 Pulse rate:   84 / minute Pulse rhythm:   regular BP sitting:   128 / 80  (left arm) Cuff size:   regular  Vitals Entered By: Harlow Mares CMA Duncan Dull) (April 15, 2010 2:55 PM)  Physical Exam  General:  Well developed, well nourished, no acute distress. Head:  Normocephalic and atraumatic. Eyes:  PERRLA, no icterus. Mouth:  No deformity or lesions, dentition normal. Neck:  Supple; no masses or thyromegaly. Chest Wall:  tender costochondral junctions bilaterally. Breasts:  large breasts, pendulous. Lungs:  Clear throughout to auscultation. Heart:  Regular rate and rhythm; no murmurs, rubs,  or bruits. Abdomen:  soft mildly protuberant abdomen with normoactive bowel sounds and minimal tenderness in epigastrium and along the left costal margin, lower abdomen is normal. Extremities:  No clubbing, cyanosis, edema or deformities noted. Skin:  Intact without significant lesions or rashes. Psych:  Alert and cooperative. Normal mood and affect.   Impression & Recommendations:  Problem # 1:  COSTOCHONDRITIS, RECURRENT (ICD-733.6) Patient is tender at the costochondral junction which may be contributing to chest pain. She has severe sharp pain associated with movement such as with sudden displacement of the chest wall. We will obtain a CT scan of the chest with attention to the sternum  and the ribs as well as of the lungs. I have advised the patient to purchase a support bra to help support her pendulous breasts. Hopefully it will help with the costochondral pain.  Orders: GI Misc Procedure/ Radiology Order (GI Misc )  Problem # 2:  GERD (ICD-530.81) Patient has gastroesophageal reflux, dyspepsia and nonspecific dysphagia. Patient is on pantoprazole 40 mg daily.  Orders: EGD (EGD) GI Misc Procedure/ Radiology Order (GI Misc )  Problem # 3:  DIVERTICULOSIS, COLON (ICD-562.10) Patient is status post colonoscopy in 2009 with findings of a hyperplastic polyp. A recall will be due in 10 years.  Patient Instructions: 1)  You have been scheduled for a CT scan of your chest with attention to ribs and sternum on 04/16/10 @ [place] [facility] on [address]. Please arrive at 1:15 pm for your 1:30 pm appointment. There is no prep needed. 2)  You have been scheduled for an endoscopy on 05/01/10 @ 3 pm. Please arrive at 2:00 pm for registration. 3)  The medication list was reviewed and reconciled.  All changed / newly prescribed medications were explained.  A complete medication list was provided to the patient / caregiver.

## 2010-10-23 NOTE — Letter (Signed)
Summary: [facility] Consult Scheduled Letter  [facility]  [address] [place], [place] [address]   Phone: [phone]  Fax: [phone]      01/15/2010 MRN: [id number]  [facility] [address] [place], [place]  [address]    Dear Ms. Azar,      We have scheduled an appointment for you.  At the recommendation of Dr.Plotnikov, we have scheduled you a consult with Dr Juanda Chance on 02/24/10 at 9:15am.  Their phone number is [phone].  If this appointment day and time is not convenient for you, please feel free to call the office of the doctor you are being referred to at the number listed above and reschedule the appointment.    [facility] [address] [place], [place]  [address] *Gastroenterology Dept.3rd Floor*    Please give 24hr notice if you need to cancel/reschedule to avoid a $50.00 fee.Also bring insurance card and any co-pay due at time of visit.     Thank you,  Patient Care Coordinator [facility]

## 2010-12-30 ENCOUNTER — Ambulatory Visit: Payer: Self-pay | Admitting: Internal Medicine

## 2011-02-03 ENCOUNTER — Encounter: Payer: Self-pay | Admitting: Internal Medicine

## 2011-02-04 ENCOUNTER — Ambulatory Visit (INDEPENDENT_AMBULATORY_CARE_PROVIDER_SITE_OTHER): Payer: BC Managed Care – PPO | Admitting: Internal Medicine

## 2011-02-04 ENCOUNTER — Encounter: Payer: Self-pay | Admitting: Internal Medicine

## 2011-02-04 ENCOUNTER — Other Ambulatory Visit (INDEPENDENT_AMBULATORY_CARE_PROVIDER_SITE_OTHER): Payer: BC Managed Care – PPO

## 2011-02-04 DIAGNOSIS — I10 Essential (primary) hypertension: Secondary | ICD-10-CM

## 2011-02-04 DIAGNOSIS — R252 Cramp and spasm: Secondary | ICD-10-CM

## 2011-02-04 DIAGNOSIS — R21 Rash and other nonspecific skin eruption: Secondary | ICD-10-CM

## 2011-02-04 DIAGNOSIS — B079 Viral wart, unspecified: Secondary | ICD-10-CM

## 2011-02-04 LAB — VITAMIN B12: Vitamin B-12: 445 pg/mL (ref 211–911)

## 2011-02-04 LAB — CBC WITH DIFFERENTIAL/PLATELET
HCT: 42.6 % (ref 36.0–46.0)
Hemoglobin: 14.7 g/dL (ref 12.0–15.0)
Lymphocytes Relative: 38.6 % (ref 12.0–46.0)
MCHC: 34.6 g/dL (ref 30.0–36.0)
MCV: 91.2 fl (ref 78.0–100.0)
Monocytes Relative: 8.6 % (ref 3.0–12.0)
Neutro Abs: 3 10*3/uL (ref 1.4–7.7)
Neutrophils Relative %: 50.1 % (ref 43.0–77.0)
RBC: 4.67 Mil/uL (ref 3.87–5.11)
WBC: 5.9 10*3/uL (ref 4.5–10.5)

## 2011-02-04 LAB — COMPREHENSIVE METABOLIC PANEL
ALT: 31 U/L (ref 0–35)
AST: 24 U/L (ref 0–37)
Albumin: 3.5 g/dL (ref 3.5–5.2)
BUN: 11 mg/dL (ref 6–23)
CO2: 26 mEq/L (ref 19–32)
Potassium: 4.4 mEq/L (ref 3.5–5.1)
Sodium: 140 mEq/L (ref 135–145)
Total Protein: 6.6 g/dL (ref 6.0–8.3)

## 2011-02-04 LAB — MAGNESIUM: Magnesium: 1.9 mg/dL (ref 1.5–2.5)

## 2011-02-04 LAB — TSH: TSH: 0.55 u[IU]/mL (ref 0.35–5.50)

## 2011-02-04 MED ORDER — TRIAMCINOLONE ACETONIDE 0.5 % EX CREA: TOPICAL_CREAM | Freq: Two times a day (BID) | CUTANEOUS | Status: DC | PRN

## 2011-02-04 NOTE — Assessment & Plan Note (Signed)
On Rx 

## 2011-02-04 NOTE — Patient Instructions (Signed)
Tylenol PM Tonic water for cramps

## 2011-02-04 NOTE — Assessment & Plan Note (Signed)
Procedure Note :     Procedure : Cryosurgery   Indication:  Wart(s)  x1 R thumb   Risks including unsuccessful procedure , bleeding, infection, bruising, scar, a need for a repeat  procedure and others were explained to the patient in detail as well as the benefits. Informed consent was obtained verbally.    1 lesion(s)  on    R wrist was/were treated with liquid nitrogen on a Q-tip in a usual fasion . Band-Aid was applied and antibiotic ointment was given for a later use.   Tolerated well. Complications none.   Postprocedure instructions :     Keep the wounds clean. You can wash them with liquid soap and water. Pat dry with gauze or a Kleenex tissue  Before applying antibiotic ointment and a Band-Aid.   You need to report immediately  if  any signs of infection develop.

## 2011-02-04 NOTE — Assessment & Plan Note (Addendum)
Poison ivy dermatitis See meds

## 2011-02-04 NOTE — Progress Notes (Signed)
  Subjective:    Patient ID: Denise Donaldson, female    DOB: 12-30-1955, 55 y.o.   MRN: [id number]  HPI  C/o feet and leg cramps C/o poison ivy rash C/o wt gain  Review of Systems  Constitutional: Positive for unexpected weight change (wt gain). Negative for activity change, appetite change and fatigue.  HENT: Negative for neck stiffness.   Respiratory: Negative for cough.   Cardiovascular: Negative for chest pain and leg swelling.  Genitourinary: Negative for urgency.  Musculoskeletal: Negative for back pain.  Skin: Rash: R wrist and hand.       L thumb wart  Psychiatric/Behavioral: Negative for suicidal ideas, self-injury and dysphoric mood. The patient is not nervous/anxious.        Objective:   Physical Exam  Constitutional: She appears well-developed and well-nourished. No distress.       Obese  HENT:  Head: Normocephalic.  Right Ear: External ear normal.  Left Ear: External ear normal.  Nose: Nose normal.  Mouth/Throat: Oropharynx is clear and moist.  Eyes: Conjunctivae are normal. Pupils are equal, round, and reactive to light. Right eye exhibits no discharge. Left eye exhibits no discharge.  Neck: Normal range of motion. Neck supple. No JVD present. No tracheal deviation present. No thyromegaly present.  Cardiovascular: Normal rate, regular rhythm and normal heart sounds.   Pulmonary/Chest: No stridor. No respiratory distress. She has no wheezes.  Abdominal: Soft. Bowel sounds are normal. She exhibits no distension and no mass. There is no tenderness. There is no rebound and no guarding.  Musculoskeletal: She exhibits no edema and no tenderness.  Lymphadenopathy:    She has no cervical adenopathy.  Neurological: She displays normal reflexes. No cranial nerve deficit. She exhibits normal muscle tone. Coordination normal.  Skin: Rash (vesic rash R wrist and a wart on R thumb) noted. No erythema.  Psychiatric: She has a normal mood and affect. Her behavior is normal.  Judgment and thought content normal.          Assessment & Plan:  SKIN RASH Poison ivy dermatitis See meds  HYPERTENSION On Rx  Wart viral  Options discussed   Procedure Note :     Procedure : Cryosurgery   Indication:  Wart(s)  x1 R thumb   Risks including unsuccessful procedure , bleeding, infection, bruising, scar, a need for a repeat  procedure and others were explained to the patient in detail as well as the benefits. Informed consent was obtained verbally.    1 lesion(s)  on    R wrist was/were treated with liquid nitrogen on a Q-tip in a usual fasion . Band-Aid was applied and antibiotic ointment was given for a later use.   Tolerated well. Complications none.   Postprocedure instructions :     Keep the wounds clean. You can wash them with liquid soap and water. Pat dry with gauze or a Kleenex tissue  Before applying antibiotic ointment and a Band-Aid.   You need to report immediately  if  any signs of infection develop.

## 2011-02-05 ENCOUNTER — Telehealth: Payer: Self-pay | Admitting: Internal Medicine

## 2011-02-05 LAB — VITAMIN D 25 HYDROXY (VIT D DEFICIENCY, FRACTURES): Vit D, 25-Hydroxy: 46 ng/mL (ref 30–89)

## 2011-02-05 NOTE — Telephone Encounter (Signed)
Stacey , please, inform the patient: labs are OK  Thank you !   

## 2011-02-05 NOTE — Telephone Encounter (Signed)
Pt informed. Copies mailed per pt request

## 2011-02-06 NOTE — Assessment & Plan Note (Signed)
SUBJECTIVE:  She is back regarding her cervical myofascial pain.  Her  pain is generally resolved except with extreme head-turning and  prolonged positioning. She states that she was at a lecture a week or  two ago and staring up at a screen for several hours, each of the 2-3  days it was held, and had some more pain that resolved on its own.  She  finds that the stretching is very beneficial as well as the Lidoderm  patches.  Her pain ranges from 0-3/10.  When she is driving her car and  has to make a sudden head turn to look at traffic, this may aggravate  her but quickly abates.  The patient is sleeping well.  She continues  her home exercise program.   REVIEW OF SYSTEMS:  Negative other than against medical advice.  Full  review is in the written history section.   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  She does not smoke or drink.   PHYSICAL EXAMINATION:  VITAL SIGNS:  Blood pressure 160/80. (The patient  did stop her Caduet.)  Pulse 89, respiratory rate 18.  She is satting  97% on room air.  GENERAL APPEARANCE:  The patient is pleasant, awake and alert, oriented  x 3.  Affect is bright and appropriate.  MUSCULOSKELETAL:  She had almost normal neck range of motion today. She  had some mild in the right sternocleidomastoid area.  The right  trapezius was minimally tender.  No trigger points were appreciated.  Muscle function was 5/5.  She had intact range of motion at the  shoulders.  Sensory exam is normal.  Reflexes are 2+.  HEART:  Regular.  CHEST:  Clear.  ABDOMEN:  Soft and nontender.   ASSESSMENT:  1. Chronic cervicalgia related to myofascial pain.  The patient's      symptoms have generally resolved and are only intermittent at best.  2. History of cervical spondylosis and disk disease which is generally      mild.   PLAN:  1. Continue with home exercise program as outlined.  She is doing      quite well.  2. Use Lidoderm patches for tender areas as needed.  3. Urged appropriate diet and exercise. She should be sensible about      her activities with her neck and take time to stretch, etc.  4. Will see her back on an as needed basis in the future.      Ranelle Oyster, M.D.  Electronically Signed     ZTS/MedQ  D:  01/10/2007 12:03:22  T:  01/10/2007 14:31:16  Job #:  [id number]   cc:   Marlan Palau, M.D.  Fax: [phone]

## 2011-02-06 NOTE — Assessment & Plan Note (Signed)
Patient is back regarding her cervical myofascial pain.  We performed  trigger point joint injections to the right sternocleidomastoid, right  trapezius muscles last visit.  She had good relief with these, as well  as her home exercise program.  She finds Lidoderm patches are helpful as  well.  She tried the Limbrel which may have given her some benefit.  Patient reports her pain is a 0-4/10.  Today it is 1/10.  She seems to  have some pain with extreme movements of the  head to the left and  somewhat to the right when she holds her head in that position for too  long.  She denies any shoulder or arm symptoms.  Pain sometimes  increases when she is driving and has to turn to look for traffic.  She  generally the Lidoderm patches on daily for 10 to 14 hours depending on  how well they adhere.   REVIEW OF SYSTEMS:  Negative for any new neurological, psychiatric,  constitutional, GU, GI, cardiology or respiratory complaints today.   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   PHYSICAL EXAMINATION:  Blood pressure 128/74, pulse 82, respirations 16.  She is at 99% on room air.  Patient is pleasant.  No acute distress.  She is alert and oriented  times 3.  Affect is generally bright and appropriate.  Gait stable.  HEART:  Regular rate and rhythm.  LUNGS are clear.  ABDOMEN:  Soft, nontender.  MOTOR EXAM is 5/5.  Sensory exam is intact.  Continues to have some  tightness in the right sternocleidomastoid with much less pain today.  Right trapezius was much less spastic.  I did not appreciate any trigger  points there today.  She had improved movement of the head with left  forward rotation, as well as left forward bending.  Spurling test was  negative.  She had minimal tenderness with extension/flexion of the  head.  Cervical posture was fair, as was shoulder posture.  She had good  shoulder movement with negative impingement signs today.   ASSESSMENT:  1. Chronic cervicalgia related to whiplash injury and myofascial pain.      She has done well with the trigger point injections, home exercise      program, Lidoderm patches, etc.  2. Intractable cervical spondylosis and disc disease.   PLAN:  1. Continue home exercise program.  No trigger point injections are      indicated today.  She seems to be doing well.  We discussed      stretching, range of motion, muscle energy techniques, massage,      etc.  She should avoid any heavy lifting and sudden head movements      to avoid exacerbating her pain.  2. Patient did not need any refills today.  3. I will l see her back in 3 months time.  I am quite pleased with      her progress.      Ranelle Oyster, M.D.  Electronically Signed     ZTS/MedQ  D:  10/13/2006 11:55:45  T:  10/13/2006 12:49:33  Job #:  [id number]

## 2011-02-06 NOTE — Assessment & Plan Note (Signed)
Mrs. Corlett is back regarding her cervical pain.  She feels that the  Lidoderm patches are helpful while they are on, although an hour or so  after they were removed she has pain once again.  The pain ranges from a  2-8 out of 10.  She finds it difficult to do things such as driving or  any activities where she has to turn her head.  She did not think that  the trigger point injections we performed were all that helpful.  She  describes her pain as sharp, dull, and sometimes aching.  She certainly  has spasm and tightness in the neck as well, particularly over the right  side.  Denies any tingling or sensory loss in the right upper extremity.   REVIEW OF SYSTEMS:  Patient denies any new neurological or psychiatric,  constitutional, GU/GI, cardiorespiratory complaints other than those  mentioned above.   SOCIAL HISTORY:  Without change.  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   PHYSICAL EXAMINATION:  Blood pressure is 154/89, pulse is 92.  Respiratory rate is 16.  She is satting 98% on room air.  Patient is  generally pleasant, no acute distress.  She is alert and oriented x3.  She sits with a head-forward posture.  She remains tight over the right  sternocleidomastoid in multiple levels with pain reproduced over the  semispinalis capitis.  Less tender today.  Right trapezius was very taut  and tender in the central portion at the interior border.  The shoulder  was negative for provocative testing today.  Motor function was 5/5 in  both arms and sensory function was normal.  Spurling's test was  negative.  She had pain in her right neck with lateral bending to the  left, as well as rotation away.  Flexion/extension also caused  discomfort there today.  She denied any pain along the spinous processes  of the cervical spine.  Lower back and extremity exam was within normal  limits.  HEART:  Regular.  CHEST:  Clear.  ABDOMEN:   Soft, nontender.  NEURO:  Cognitively she was appropriate.   ASSESSMENT:  1. Chronic cervicalgia related to whiplash injury and myofascial pain.      She has had some results with the Lidoderm patches.  Trigger point      injections were equivocal.  We did uncover another focal trigger      point today in the right trapezius that may be accounting for a lot      of her referred pain up the neck and head.  No neurological signs      are seen on exam today.  2. Documented cervical spondylosis and disc disease.   PLAN:  1. Patient wishes to stay conservative.  I think she could benefit      from another trial of muscle relaxant, but she refuses to go this      route.  2. Today we did inject 4 separate trigger points, 3 in her right      sternocleidomastoid, and 1 in the right trapezius, mid belly.  Each      with approximately 1.5 to 2 cc of 1% lidocaine.  Patient tolerated      well.  3. Continue with Lidoderm patches as prescribed.  She will use 2-3 a      day.  4. Gave patient a trial of Limbrel 250 mg b.i.d. for anti-inflammatory  effects.  5. Continue with stretching, range of motion, ice, heat, and other      modalities.  Patient seems to be a bit frustrated with these as she      needs to continue performing these to maintain the range of motion      that she has.  6. We will see the patient back in about a month's time.  I could      consider cervical epidural injections based on progression.      Ranelle Oyster, M.D.  Electronically Signed     ZTS/MedQ  D:  09/17/2006 16:27:18  T:  09/17/2006 18:59:48  Job #:  [id number]   cc:   Marlan Palau, M.D.  Fax: [phone]

## 2011-02-08 ENCOUNTER — Encounter: Payer: Self-pay | Admitting: Internal Medicine

## 2011-05-11 ENCOUNTER — Ambulatory Visit (INDEPENDENT_AMBULATORY_CARE_PROVIDER_SITE_OTHER): Payer: BC Managed Care – PPO | Admitting: Internal Medicine

## 2011-05-11 ENCOUNTER — Encounter: Payer: Self-pay | Admitting: Internal Medicine

## 2011-05-11 DIAGNOSIS — K219 Gastro-esophageal reflux disease without esophagitis: Secondary | ICD-10-CM

## 2011-05-11 DIAGNOSIS — Z8669 Personal history of other diseases of the nervous system and sense organs: Secondary | ICD-10-CM | POA: Insufficient documentation

## 2011-05-11 DIAGNOSIS — R071 Chest pain on breathing: Secondary | ICD-10-CM

## 2011-05-11 DIAGNOSIS — L603 Nail dystrophy: Secondary | ICD-10-CM | POA: Insufficient documentation

## 2011-05-11 DIAGNOSIS — R609 Edema, unspecified: Secondary | ICD-10-CM

## 2011-05-11 DIAGNOSIS — D485 Neoplasm of uncertain behavior of skin: Secondary | ICD-10-CM

## 2011-05-11 DIAGNOSIS — B079 Viral wart, unspecified: Secondary | ICD-10-CM

## 2011-05-11 DIAGNOSIS — L608 Other nail disorders: Secondary | ICD-10-CM

## 2011-05-11 DIAGNOSIS — I10 Essential (primary) hypertension: Secondary | ICD-10-CM

## 2011-05-11 MED ORDER — ASPIRIN EC 81 MG PO TBEC: 81.0000 mg | DELAYED_RELEASE_TABLET | Freq: Every day | ORAL | Status: DC

## 2011-05-11 NOTE — Assessment & Plan Note (Signed)
We can schedule

## 2011-05-11 NOTE — Assessment & Plan Note (Signed)
On prn Rx 

## 2011-05-11 NOTE — Assessment & Plan Note (Signed)
Procedure Note :     Procedure : Cryosurgery   Indication:  Wart(s)     Risks including unsuccessful procedure , bleeding, infection, bruising, scar, a need for a repeat  procedure and others were explained to the patient in detail as well as the benefits. Informed consent was obtained verbally.    2 lesion(s)  on B hands and 2 on neck  was/were treated with liquid nitrogen on a Q-tip in a usual fasion . Band-Aid was applied and antibiotic ointment was given for a later use.   Tolerated well. Complications none.   Postprocedure instructions :     Keep the wounds clean. You can wash them with liquid soap and water. Pat dry with gauze or a Kleenex tissue  Before applying antibiotic ointment and a Band-Aid.   You need to report immediately  if  any signs of infection develop.

## 2011-05-11 NOTE — Assessment & Plan Note (Signed)
Try Biotin 

## 2011-05-11 NOTE — Progress Notes (Signed)
  Subjective:    Patient ID: Denise Donaldson, female    DOB: May 02, 1956, 55 y.o.   MRN: [id number]  HPI  F/u HTN, GERD C/o fingernails being brittle C/o warts  Review of Systems     Objective:   Physical Exam        Assessment & Plan:

## 2011-05-11 NOTE — Assessment & Plan Note (Signed)
Resolved

## 2011-05-11 NOTE — Assessment & Plan Note (Signed)
On Rx 

## 2011-05-11 NOTE — Assessment & Plan Note (Signed)
Baby ASA 

## 2011-05-11 NOTE — Patient Instructions (Signed)
Biotin for nails

## 2011-09-27 ENCOUNTER — Other Ambulatory Visit: Payer: Self-pay | Admitting: Internal Medicine

## 2011-11-16 ENCOUNTER — Ambulatory Visit: Payer: BC Managed Care – PPO | Admitting: Internal Medicine

## 2012-02-08 ENCOUNTER — Ambulatory Visit: Payer: BC Managed Care – PPO | Admitting: Internal Medicine

## 2012-02-08 ENCOUNTER — Encounter: Payer: Self-pay | Admitting: Internal Medicine

## 2012-02-26 ENCOUNTER — Encounter: Payer: Self-pay | Admitting: Internal Medicine

## 2012-02-26 ENCOUNTER — Ambulatory Visit (INDEPENDENT_AMBULATORY_CARE_PROVIDER_SITE_OTHER): Payer: BC Managed Care – PPO | Admitting: Internal Medicine

## 2012-02-26 VITALS — BP 132/84 | HR 76 | Temp 97.5°F | Resp 16 | Wt 182.0 lb

## 2012-02-26 DIAGNOSIS — K219 Gastro-esophageal reflux disease without esophagitis: Secondary | ICD-10-CM

## 2012-02-26 DIAGNOSIS — R071 Chest pain on breathing: Secondary | ICD-10-CM

## 2012-02-26 DIAGNOSIS — I10 Essential (primary) hypertension: Secondary | ICD-10-CM

## 2012-02-26 DIAGNOSIS — D126 Benign neoplasm of colon, unspecified: Secondary | ICD-10-CM

## 2012-02-26 DIAGNOSIS — G500 Trigeminal neuralgia: Secondary | ICD-10-CM

## 2012-02-26 DIAGNOSIS — K635 Polyp of colon: Secondary | ICD-10-CM | POA: Insufficient documentation

## 2012-02-26 MED ORDER — CELECOXIB 200 MG PO CAPS: 200.0000 mg | ORAL_CAPSULE | Freq: Two times a day (BID) | ORAL | Status: AC

## 2012-02-26 MED ORDER — CHOLECALCIFEROL 25 MCG (1000 UT) PO TABS: 1000.0000 [IU] | ORAL_TABLET | Freq: Every day | ORAL | Status: DC

## 2012-02-26 NOTE — Assessment & Plan Note (Signed)
Heart cath in 2005 Dr Tresa Endo L costochondritis

## 2012-02-26 NOTE — Progress Notes (Signed)
Patient ID: Denise Donaldson, female   DOB: 1956/06/28, 56 y.o.   MRN: [id number]  Subjective:    Patient ID: Denise Donaldson, female    DOB: 1956-02-28, 56 y.o.   MRN: [id number]  Headache  This is a new problem. The current episode started in the past 7 days. The problem occurs intermittently. The problem has been unchanged. The pain is located in the right unilateral, parietal and temporal region. The pain quality is not similar to prior headaches. The quality of the pain is described as throbbing and aching. The pain is at a severity of 4/10. The pain is moderate. Associated symptoms include scalp tenderness. Pertinent negatives include no back pain, coughing, eye redness, numbness, photophobia, rhinorrhea, tinnitus or weakness. There is no history of cancer, cluster headaches, migraine headaches or migraines in the family.   F/u GERD C/o CP C/o moles BP Readings from Last 3 Encounters:  02/26/12 132/84  05/11/11 130/80  02/04/11 120/72   Wt Readings from Last 3 Encounters:  02/26/12 182 lb (82.555 kg)  05/11/11 182 lb (82.555 kg)  02/04/11 180 lb (81.647 kg)     F/u HTN, GERD  Review of Systems  Constitutional: Positive for unexpected weight change (wt gain). Negative for activity change, appetite change and fatigue.  HENT: Negative for rhinorrhea, neck stiffness and tinnitus.   Eyes: Negative for photophobia, discharge, redness and visual disturbance.  Respiratory: Negative for cough.   Cardiovascular: Negative for chest pain and leg swelling.  Genitourinary: Negative for urgency.  Musculoskeletal: Negative for back pain.  Skin: Rash: R wrist and hand.       L thumb wart  Neurological: Positive for headaches. Negative for weakness and numbness.  Psychiatric/Behavioral: Negative for suicidal ideas, self-injury and dysphoric mood. The patient is not nervous/anxious.        Objective:   Physical Exam  Constitutional: She appears well-developed and well-nourished. No  distress.       Obese  HENT:  Head: Normocephalic.  Right Ear: External ear normal.  Left Ear: External ear normal.  Nose: Nose normal.  Mouth/Throat: Oropharynx is clear and moist.  Eyes: Conjunctivae are normal. Pupils are equal, round, and reactive to light. Right eye exhibits no discharge. Left eye exhibits no discharge.  Neck: Normal range of motion. Neck supple. No JVD present. No tracheal deviation present. No thyromegaly present.  Cardiovascular: Normal rate, regular rhythm and normal heart sounds.   Pulmonary/Chest: No stridor. No respiratory distress. She has no wheezes.  Abdominal: Soft. Bowel sounds are normal. She exhibits no distension and no mass. There is no tenderness. There is no rebound and no guarding.  Musculoskeletal: She exhibits no edema and no tenderness.  Lymphadenopathy:    She has no cervical adenopathy.  Neurological: She displays normal reflexes. No cranial nerve deficit. She exhibits normal muscle tone. Coordination normal.  Skin: No rash noted. No erythema.  Psychiatric: She has a normal mood and affect. Her behavior is normal. Judgment and thought content normal.          Assessment & Plan:

## 2012-02-26 NOTE — Assessment & Plan Note (Signed)
Continue with current prescription therapy as reflected on the Med list. D/c MVI

## 2012-02-26 NOTE — Assessment & Plan Note (Signed)
She had colonoscopy ?7 years ago. Needs a repeat colonoscopy -- Dr Juanda Chance; her appt is pending

## 2012-02-26 NOTE — Assessment & Plan Note (Signed)
NSAID w/caution

## 2012-02-26 NOTE — Assessment & Plan Note (Signed)
Doing good  

## 2012-03-22 ENCOUNTER — Encounter: Payer: Self-pay | Admitting: *Deleted

## 2012-04-05 ENCOUNTER — Encounter: Payer: Self-pay | Admitting: Internal Medicine

## 2012-04-05 ENCOUNTER — Ambulatory Visit (INDEPENDENT_AMBULATORY_CARE_PROVIDER_SITE_OTHER): Payer: BC Managed Care – PPO | Admitting: Internal Medicine

## 2012-04-05 VITALS — BP 130/80 | HR 76 | Ht 62.0 in | Wt 183.8 lb

## 2012-04-05 DIAGNOSIS — Z860100 Personal history of colon polyps, unspecified: Secondary | ICD-10-CM

## 2012-04-05 DIAGNOSIS — K2270 Barrett's esophagus without dysplasia: Secondary | ICD-10-CM

## 2012-04-05 MED ORDER — MOVIPREP 100 G PO SOLR: ORAL | Status: DC

## 2012-04-05 MED ORDER — PANTOPRAZOLE SODIUM 40 MG PO TBEC: 40.0000 mg | DELAYED_RELEASE_TABLET | Freq: Every day | ORAL | Status: DC

## 2012-04-05 NOTE — Patient Instructions (Addendum)
You have been scheduled for an endoscopy and colonoscopy with propofol. Please follow the written instructions given to you at your visit today. Please pick up your prep at the pharmacy within the next 1-3 days. If you use inhalers (even only as needed), please bring them with you on the day of your procedure. We have sent the following medications to your pharmacy for you to pick up at your convenience: Pantoprazole CC: Dr Sonda Primes

## 2012-04-05 NOTE — Progress Notes (Signed)
Denise Donaldson 09-02-1956 MRN [id number]   History of Present Illness:  This is a 56 year old white female with left costal margin discomfort which is a dull pain occurring almost daily and sometimes getting worse after meals. When she lies down at night, the discomfort moves to the center of the chest and subxiphoid area. She was diagnosed with Barrett's esophagus on an upper endoscopy in August 2011. She also at that time had moderately active gastritis with H. pylori which was treated with triple therapy. Gastric biopsies showed intestinal metaplasia. She is due for a repeat upper endoscopy. She denies any lower GI symptoms but has a history of colon polyps on a colonoscopy by Dr Bosie Clos about 6 years ago. She was told to have a recall colonoscopy in 5 years. We saw her in 2011 for chest pain which was attributed to gastroesophageal reflux. She is on Protonix 40 mg but does not take it every day, only 3-4 times a week. She works as a Hospital doctor for [employer], lifting heavy coolers.   Past Medical History  Diagnosis Date  . HTN (hypertension)   . Costochondral chest pain     Left  . Venous insufficiency     Bilat LE  . Menopausal disorder     After 21 Dr Rana Snare  . Hyperplastic colon polyp   . Diverticulosis   . History of pneumonia   . GERD (gastroesophageal reflux disease)     Dr Juanda Chance, EGD 2011 WNL  . Obesity   . Pectus excavatum   . Foot fracture, right     Dr Otelia Sergeant  . Helicobacter pylori gastritis   . Colon polyps    Past Surgical History  Procedure Date  . Appendectomy   . Cesarean section   . Ovarian cyst removal   . Cardiac catheterization     reports that she has never smoked. She has never used smokeless tobacco. She reports that she does not drink alcohol or use illicit drugs. family history includes Colon cancer in her paternal uncle; Diabetes in her maternal aunt and mother; Heart disease in her father and maternal grandmother; Hypertension in her sister;  Prostate cancer in her father; Prostate cancer (age of onset:70) in her paternal grandfather; Stomach cancer (age of onset:70) in her paternal uncle; and Stroke in her paternal grandmother. Allergies  Allergen Reactions  . Metoprolol Tartrate     REACTION: syncope  . Penicillins         Review of Systems:  The remainder of the 10 point ROS is negative except as outlined in H&P   Physical Exam: General appearance  Well developed, in no distress. Eyes- non icteric. HEENT nontraumatic, normocephalic. Mouth no lesions, tongue papillated, no cheilosis. Neck supple without adenopathy, thyroid not enlarged, no carotid bruits, no JVD. Lungs Clear to auscultation bilaterally. Cor normal S1, normal S2, regular rhythm, no murmur,  quiet precordium. Abdomen: Mild tenderness in epigastrium and the left costal margin. Right upper quadrant is unremarkable. Bowel sounds were normoactive. Rectal: not done. Extremities no pedal edema. Skin no lesions. Neurological alert and oriented x 3. Psychological normal mood and affect.   Assessment and Plan:  Problem #1 Recurrent epigastric and left upper quadrant abdominal discomfort related to H. pylori gastritis and possibly Barrett's esophagus. She has not been taking her PPIs on a regular basis. She is due for an upper endoscopy and repeat biopsies. She has also gained some weight and is having more reflux on that basis. We will schedule her for an upper endoscopy  and biopsies. She should avoid heavy lifting and sleep with the head of the bed elevated. I have asked her to take the Protonix on a daily basis.  Problem #2 Colorectal screening. She is due for a recall colonoscopy. She has history of colon polyps. We will schedule her for endoscopy and colonoscopy on the same day.  04/05/2012 Denise Donaldson

## 2012-04-06 ENCOUNTER — Encounter: Payer: Self-pay | Admitting: Internal Medicine

## 2012-04-13 ENCOUNTER — Ambulatory Visit: Payer: BC Managed Care – PPO | Admitting: Internal Medicine

## 2012-04-26 ENCOUNTER — Other Ambulatory Visit: Payer: Self-pay | Admitting: Internal Medicine

## 2012-04-27 ENCOUNTER — Ambulatory Visit (HOSPITAL_COMMUNITY)
Admission: RE | Admit: 2012-04-27 | Discharge: 2012-04-27 | Disposition: A | Discharge status: Home / Self Care | Payer: BC Managed Care – PPO | Source: Ambulatory Visit | Attending: Internal Medicine | Admitting: Internal Medicine

## 2012-04-27 ENCOUNTER — Encounter (HOSPITAL_COMMUNITY)
Admission: RE | Disposition: A | Discharge status: Home / Self Care | Payer: Self-pay | Source: Ambulatory Visit | Attending: Internal Medicine

## 2012-04-27 DIAGNOSIS — Z860100 Personal history of colon polyps, unspecified: Secondary | ICD-10-CM | POA: Insufficient documentation

## 2012-04-27 DIAGNOSIS — K2270 Barrett's esophagus without dysplasia: Secondary | ICD-10-CM

## 2012-04-27 DIAGNOSIS — K319 Disease of stomach and duodenum, unspecified: Secondary | ICD-10-CM | POA: Insufficient documentation

## 2012-04-27 DIAGNOSIS — K635 Polyp of colon: Secondary | ICD-10-CM

## 2012-04-27 DIAGNOSIS — K2940 Chronic atrophic gastritis without bleeding: Secondary | ICD-10-CM | POA: Insufficient documentation

## 2012-04-27 DIAGNOSIS — Z79899 Other long term (current) drug therapy: Secondary | ICD-10-CM | POA: Insufficient documentation

## 2012-04-27 DIAGNOSIS — K219 Gastro-esophageal reflux disease without esophagitis: Secondary | ICD-10-CM | POA: Insufficient documentation

## 2012-04-27 DIAGNOSIS — D126 Benign neoplasm of colon, unspecified: Secondary | ICD-10-CM

## 2012-04-27 DIAGNOSIS — K5730 Diverticulosis of large intestine without perforation or abscess without bleeding: Secondary | ICD-10-CM | POA: Insufficient documentation

## 2012-04-27 DIAGNOSIS — Z2239 Carrier of other specified bacterial diseases: Secondary | ICD-10-CM | POA: Insufficient documentation

## 2012-04-27 DIAGNOSIS — K209 Esophagitis, unspecified without bleeding: Secondary | ICD-10-CM | POA: Insufficient documentation

## 2012-04-27 DIAGNOSIS — I10 Essential (primary) hypertension: Secondary | ICD-10-CM | POA: Insufficient documentation

## 2012-04-27 DIAGNOSIS — K620 Anal polyp: Secondary | ICD-10-CM | POA: Insufficient documentation

## 2012-04-27 HISTORY — PX: ESOPHAGOGASTRODUODENOSCOPY: SHX5428

## 2012-04-27 HISTORY — PX: COLONOSCOPY: SHX5424

## 2012-04-27 SURGERY — EGD (ESOPHAGOGASTRODUODENOSCOPY)
Anesthesia: Moderate Sedation

## 2012-04-27 MED ORDER — MIDAZOLAM HCL 10 MG/2ML IJ SOLN
INTRAMUSCULAR | Status: AC
  Filled 2012-04-27: qty 4

## 2012-04-27 MED ORDER — DIPHENHYDRAMINE HCL 50 MG/ML IJ SOLN
INTRAMUSCULAR | Status: AC
  Filled 2012-04-27: qty 1

## 2012-04-27 MED ORDER — FENTANYL CITRATE 0.05 MG/ML IJ SOLN
INTRAMUSCULAR | Status: DC | PRN
  Administered 2012-04-27 (×4): 25 ug via INTRAVENOUS

## 2012-04-27 MED ORDER — MIDAZOLAM HCL 10 MG/2ML IJ SOLN
INTRAMUSCULAR | Status: DC | PRN
  Administered 2012-04-27 (×2): 2 mg via INTRAVENOUS
  Administered 2012-04-27 (×2): 1 mg via INTRAVENOUS
  Administered 2012-04-27: 2 mg via INTRAVENOUS

## 2012-04-27 MED ORDER — SODIUM CHLORIDE 0.9 % IV SOLN
INTRAVENOUS | Status: DC
  Administered 2012-04-27: 09:00:00 via INTRAVENOUS

## 2012-04-27 MED ORDER — BUTAMBEN-TETRACAINE-BENZOCAINE 2-2-14 % EX AERO
INHALATION_SPRAY | CUTANEOUS | Status: DC | PRN
  Administered 2012-04-27: 2 via TOPICAL

## 2012-04-27 MED ORDER — FENTANYL CITRATE 0.05 MG/ML IJ SOLN
INTRAMUSCULAR | Status: AC
  Filled 2012-04-27: qty 4

## 2012-04-27 MED ORDER — SODIUM CHLORIDE 0.9 % IV SOLN: INTRAVENOUS | Status: DC

## 2012-04-27 NOTE — H&P (View-Only) (Signed)
Denise Donaldson 02/23/1956 MRN [id number]   History of Present Illness:  This is a 56-year-old white female with left costal margin discomfort which is a dull pain occurring almost daily and sometimes getting worse after meals. When she lies down at night, the discomfort moves to the center of the chest and subxiphoid area. She was diagnosed with Barrett's esophagus on an upper endoscopy in August 2011. She also at that time had moderately active gastritis with H. pylori which was treated with triple therapy. Gastric biopsies showed intestinal metaplasia. She is due for a repeat upper endoscopy. She denies any lower GI symptoms but has a history of colon polyps on a colonoscopy by Dr Schooler about 6 years ago. She was told to have a recall colonoscopy in 5 years. We saw her in 2011 for chest pain which was attributed to gastroesophageal reflux. She is on Protonix 40 mg but does not take it every day, only 3-4 times a week. She works as a driver for [employer], lifting heavy coolers.   Past Medical History  Diagnosis Date  . HTN (hypertension)   . Costochondral chest pain     Left  . Venous insufficiency     Bilat LE  . Menopausal disorder     After 54 Dr Lowe  . Hyperplastic colon polyp   . Diverticulosis   . History of pneumonia   . GERD (gastroesophageal reflux disease)     Dr Malakhi Markwood, EGD 2011 WNL  . Obesity   . Pectus excavatum   . Foot fracture, right     Dr Nitka  . Helicobacter pylori gastritis   . Colon polyps    Past Surgical History  Procedure Date  . Appendectomy   . Cesarean section   . Ovarian cyst removal   . Cardiac catheterization     reports that she has never smoked. She has never used smokeless tobacco. She reports that she does not drink alcohol or use illicit drugs. family history includes Colon cancer in her paternal uncle; Diabetes in her maternal aunt and mother; Heart disease in her father and maternal grandmother; Hypertension in her sister;  Prostate cancer in her father; Prostate cancer (age of onset:70) in her paternal grandfather; Stomach cancer (age of onset:70) in her paternal uncle; and Stroke in her paternal grandmother. Allergies  Allergen Reactions  . Metoprolol Tartrate     REACTION: syncope  . Penicillins         Review of Systems:  The remainder of the 10 point ROS is negative except as outlined in H&P   Physical Exam: General appearance  Well developed, in no distress. Eyes- non icteric. HEENT nontraumatic, normocephalic. Mouth no lesions, tongue papillated, no cheilosis. Neck supple without adenopathy, thyroid not enlarged, no carotid bruits, no JVD. Lungs Clear to auscultation bilaterally. Cor normal S1, normal S2, regular rhythm, no murmur,  quiet precordium. Abdomen: Mild tenderness in epigastrium and the left costal margin. Right upper quadrant is unremarkable. Bowel sounds were normoactive. Rectal: not done. Extremities no pedal edema. Skin no lesions. Neurological alert and oriented x 3. Psychological normal mood and affect.   Assessment and Plan:  Problem #1 Recurrent epigastric and left upper quadrant abdominal discomfort related to H. pylori gastritis and possibly Barrett's esophagus. She has not been taking her PPIs on a regular basis. She is due for an upper endoscopy and repeat biopsies. She has also gained some weight and is having more reflux on that basis. We will schedule her for an upper endoscopy   and biopsies. She should avoid heavy lifting and sleep with the head of the bed elevated. I have asked her to take the Protonix on a daily basis.  Problem #2 Colorectal screening. She is due for a recall colonoscopy. She has history of colon polyps. We will schedule her for endoscopy and colonoscopy on the same day.  04/05/2012 Zaden Sako  

## 2012-04-27 NOTE — Op Note (Signed)
[facility] [address] [place], [place]  [address]  ENDOSCOPY PROCEDURE REPORT  PATIENT:  Denise, Donaldson  MR#:  [id number] BIRTHDATE:  July 20, 1956, 56 yrs. old  GENDER:  female  ENDOSCOPIST:  Hedwig Morton. Juanda Chance, MD Referred by:  Linda Hedges Plotnikov, M.D.  PROCEDURE DATE:  04/27/2012 PROCEDURE:  EGD with biopsy, 43239 ASA CLASS:  Class II INDICATIONS:  h/o Barrett's Esophagus Barrett's esophagus 2011, also intestinal metaplsia and H.Pylori gastritis  MEDICATIONS:   These medications were titrated to patient response per physician's verbal order, Versed 5 mg, Epinephrine 75 mcg TOPICAL ANESTHETIC:  Cetacaine Spray  DESCRIPTION OF PROCEDURE:   After the risks benefits and alternatives of the procedure were thoroughly explained, informed consent was obtained.  The Pentax Gastroscope [id number] endoscope was introduced through the mouth and advanced to the second portion of the duodenum, without limitations.  The instrument was slowly withdrawn as the mucosa was fully examined. <<PROCEDUREIMAGES>>  nl esophagus. With standard forceps, a biopsy was obtained and sent to pathology (see image1). r/o Barrett's  Mild gastritis was found. With standard forceps, a biopsy was obtained and sent to pathology (see image2 and image5).  Otherwise the examination was normal (see image3 and image4).    Retroflexed views revealed no abnormalities.    The scope was then withdrawn from the patient and the procedure completed.  COMPLICATIONS:  None  ENDOSCOPIC IMPRESSION: 1) Nl esophagus 2) Mild gastritis 3) Otherwise normal examination S/P biopsies G-E JUNCTION, S/P GASTRIC BIOPSIES RECOMMENDATIONS: 1) Anti-reflux regimen to be follow 2) Await biopsy results cont Protonix 40 mg daily  REPEAT EXAM:  In 2 - 5 year(s) for.  recall pending results of the biopsies  ______________________________ Hedwig Morton. Juanda Chance, MD  CC:  n. eSIGNED:   Hedwig Morton. Magdalynn Davilla at 04/27/2012 09:24  AM  Mosetta Putt, [id number]

## 2012-04-27 NOTE — Interval H&P Note (Signed)
History and Physical Interval Note:  04/27/2012 8:17 AM  Denise Donaldson  has presented today for surgery, with the diagnosis of colon polyp/gerd  The various methods of treatment have been discussed with the patient and family. After consideration of risks, benefits and other options for treatment, the patient has consented to  Procedure(s) (LRB): ESOPHAGOGASTRODUODENOSCOPY (EGD) (N/A) COLONOSCOPY (N/A) as a surgical intervention .  The patient's history has been reviewed, patient examined, no change in status, stable for surgery.  I have reviewed the patient's chart and labs.  Questions were answered to the patient's satisfaction.     [name]

## 2012-04-27 NOTE — Op Note (Signed)
[facility] [address] [place], [place]  [address]  COLONOSCOPY PROCEDURE REPORT  PATIENT:  Denise Donaldson, Denise Donaldson  MR#:  [id number] BIRTHDATE:  [birth year], 56 yrs. old  GENDER:  female ENDOSCOPIST:  Hedwig Morton. Juanda Chance, MD REF. BY:  Linda Hedges. Plotnikov, M.D. PROCEDURE DATE:  04/27/2012 PROCEDURE:  Colon with cold biopsy polypectomy ASA CLASS:  Class II INDICATIONS:  history of hyperplastic polyps colon 2006 MEDICATIONS:   These medications were titrated to patient response per physician's verbal order, Versed 3 mg, Fentanyl 25 mcg  DESCRIPTION OF PROCEDURE:   After the risks and benefits and of the procedure were explained, informed consent was obtained. Digital rectal exam was performed and revealed no rectal masses. The Pentax Ped Colon [id number] endoscope was introduced through the anus and advanced to the cecum, which was identified by both the appendix and ileocecal valve.  The quality of the prep was excellent, using MoviPrep.  The instrument was then slowly withdrawn as the colon was fully examined. <<PROCEDUREIMAGES>>  FINDINGS:  A diminutive polyp was found in the rectum. 3 mm polyp in the rectum The polyp was removed using cold biopsy forceps (see image5).  Scattered diverticula were found in the sigmoid colon (see image1).  Otherwise normal colonoscopy without other polyps, masses, vascular ectasias, or inflammatory changes (see image2, image3, image4, and image6).   Retroflexed views in the rectum revealed no abnormalities.    The scope was then withdrawn from the patient and the procedure completed.  COMPLICATIONS:  None ENDOSCOPIC IMPRESSION: 1) Diminutive polyp in the rectum 2) Diverticula, scattered in the sigmoid colon 3) Otherwise nl colonoscopy WMO RECOMMENDATIONS: 1) Await pathology results  REPEAT EXAM:  In 10 year(s) for.  ______________________________ Hedwig Morton. Juanda Chance, MD  CC:  n. eSIGNED:   Hedwig Morton. Analise Glotfelty at 04/27/2012 09:29  AM  Mosetta Putt, [id number]

## 2012-04-28 ENCOUNTER — Encounter: Payer: Self-pay | Admitting: Internal Medicine

## 2012-04-28 ENCOUNTER — Encounter (HOSPITAL_COMMUNITY): Payer: Self-pay

## 2012-04-28 ENCOUNTER — Encounter (HOSPITAL_COMMUNITY): Payer: Self-pay | Admitting: Internal Medicine

## 2012-05-25 ENCOUNTER — Encounter: Payer: BC Managed Care – PPO | Admitting: Internal Medicine

## 2012-06-18 ENCOUNTER — Other Ambulatory Visit: Payer: Self-pay | Admitting: Internal Medicine

## 2012-07-15 ENCOUNTER — Ambulatory Visit (INDEPENDENT_AMBULATORY_CARE_PROVIDER_SITE_OTHER)
Admission: RE | Admit: 2012-07-15 | Discharge: 2012-07-15 | Disposition: A | Discharge status: Home / Self Care | Payer: BC Managed Care – PPO | Source: Ambulatory Visit | Attending: Internal Medicine | Admitting: Internal Medicine

## 2012-07-15 ENCOUNTER — Ambulatory Visit (INDEPENDENT_AMBULATORY_CARE_PROVIDER_SITE_OTHER): Payer: BC Managed Care – PPO | Admitting: Internal Medicine

## 2012-07-15 ENCOUNTER — Encounter: Payer: Self-pay | Admitting: Internal Medicine

## 2012-07-15 VITALS — BP 150/100 | HR 76 | Temp 97.1°F | Resp 16 | Wt 185.0 lb

## 2012-07-15 DIAGNOSIS — I10 Essential (primary) hypertension: Secondary | ICD-10-CM

## 2012-07-15 DIAGNOSIS — M545 Low back pain, unspecified: Secondary | ICD-10-CM

## 2012-07-15 DIAGNOSIS — B079 Viral wart, unspecified: Secondary | ICD-10-CM

## 2012-07-15 DIAGNOSIS — M25559 Pain in unspecified hip: Secondary | ICD-10-CM

## 2012-07-15 DIAGNOSIS — M25551 Pain in right hip: Secondary | ICD-10-CM

## 2012-07-15 MED ORDER — MELOXICAM 15 MG PO TABS: 15.0000 mg | ORAL_TABLET | Freq: Every day | ORAL | Status: DC

## 2012-07-15 NOTE — Progress Notes (Signed)
   Subjective:    Patient ID: Denise Donaldson, female    DOB: 07/06/1956, 56 y.o.   MRN: [id number]  HPI C/o LBP - better with bending over x 1 year: 6-7/10 - worse after work C/oR hip pain with restricted ROM over past year F/u GERD C/o CP C/o moles  BP Readings from Last 3 Encounters:  07/15/12 150/100  04/27/12 130/78  04/27/12 130/78   Wt Readings from Last 3 Encounters:  07/15/12 185 lb (83.915 kg)  04/27/12 180 lb (81.647 kg)  04/27/12 180 lb (81.647 kg)     F/u HTN, GERD  Review of Systems  Constitutional: Positive for unexpected weight change (wt gain). Negative for activity change, appetite change and fatigue.  HENT: Negative for neck stiffness.   Eyes: Negative for discharge and visual disturbance.  Cardiovascular: Negative for chest pain and leg swelling.  Genitourinary: Negative for urgency.  Musculoskeletal: Positive for back pain and arthralgias.  Skin: Rash: R wrist and hand.       L thumb wart  Psychiatric/Behavioral: Negative for suicidal ideas, self-injury and dysphoric mood. The patient is not nervous/anxious.        Objective:   Physical Exam  Constitutional: She appears well-developed. No distress.       Obese  HENT:  Head: Normocephalic.  Right Ear: External ear normal.  Left Ear: External ear normal.  Nose: Nose normal.  Mouth/Throat: Oropharynx is clear and moist.  Eyes: Conjunctivae normal are normal. Pupils are equal, round, and reactive to light. Right eye exhibits no discharge. Left eye exhibits no discharge.  Neck: Normal range of motion. Neck supple. No JVD present. No tracheal deviation present. No thyromegaly present.  Cardiovascular: Normal rate, regular rhythm and normal heart sounds.   Pulmonary/Chest: No stridor. No respiratory distress. She has no wheezes.  Abdominal: Soft. Bowel sounds are normal. She exhibits no distension and no mass. There is no tenderness. There is no rebound and no guarding.  Musculoskeletal: She  exhibits tenderness. She exhibits no edema.       R hip w/restricted with ROM  Lymphadenopathy:    She has no cervical adenopathy.  Neurological: She displays normal reflexes. No cranial nerve deficit. She exhibits normal muscle tone. Coordination normal.  Skin: No rash noted. No erythema.  Psychiatric: She has a normal mood and affect. Her behavior is normal. Judgment and thought content normal.  Warts   Procedure Note :     Procedure : Cryosurgery   Indication:  Wart(s)     Risks including unsuccessful procedure , bleeding, infection, bruising, scar, a need for a repeat  procedure and others were explained to the patient in detail as well as the benefits. Informed consent was obtained verbally.    3 lesion(s)  on   extremities was/were treated with liquid nitrogen on a Q-tip in a usual fasion . Band-Aid was applied and antibiotic ointment was given for a later use.   Tolerated well. Complications none.   Postprocedure instructions :     Keep the wounds clean. You can wash them with liquid soap and water. Pat dry with gauze or a Kleenex tissue  Before applying antibiotic ointment and a Band-Aid.   You need to report immediately  if  any signs of infection develop.          Assessment & Plan:

## 2012-07-15 NOTE — Patient Instructions (Signed)
   Postprocedure instructions :     Keep the wounds clean. You can wash them with liquid soap and water. Pat dry with gauze or a Kleenex tissue  Before applying antibiotic ointment and a Band-Aid.   You need to report immediately  if  any signs of infection develop.    

## 2012-07-17 NOTE — Assessment & Plan Note (Signed)
Chronic MSK LS xray - see Meds

## 2012-07-17 NOTE — Assessment & Plan Note (Signed)
See Cryo 

## 2012-07-17 NOTE — Assessment & Plan Note (Signed)
Continue with current prescription therapy as reflected on the Med list.  

## 2012-07-17 NOTE — Assessment & Plan Note (Signed)
10/13 chronic  MSK Xray Exercise for ROM

## 2012-08-30 ENCOUNTER — Ambulatory Visit: Payer: Self-pay | Admitting: Internal Medicine

## 2012-09-09 ENCOUNTER — Telehealth: Payer: Self-pay | Admitting: Internal Medicine

## 2012-09-09 MED ORDER — TRIAMCINOLONE ACETONIDE 0.5 % EX CREA: TOPICAL_CREAM | Freq: Two times a day (BID) | CUTANEOUS | Status: DC | PRN

## 2012-09-09 NOTE — Telephone Encounter (Signed)
Needs Rx

## 2012-09-16 ENCOUNTER — Encounter: Payer: Self-pay | Admitting: *Deleted

## 2012-10-17 ENCOUNTER — Encounter: Payer: Self-pay | Admitting: Internal Medicine

## 2012-10-17 ENCOUNTER — Ambulatory Visit (INDEPENDENT_AMBULATORY_CARE_PROVIDER_SITE_OTHER): Payer: BC Managed Care – PPO | Admitting: Internal Medicine

## 2012-10-17 ENCOUNTER — Telehealth: Payer: Self-pay | Admitting: *Deleted

## 2012-10-17 VITALS — BP 120/82 | HR 76 | Temp 97.0°F | Resp 16 | Wt 185.0 lb

## 2012-10-17 DIAGNOSIS — I10 Essential (primary) hypertension: Secondary | ICD-10-CM

## 2012-10-17 DIAGNOSIS — L603 Nail dystrophy: Secondary | ICD-10-CM

## 2012-10-17 DIAGNOSIS — L570 Actinic keratosis: Secondary | ICD-10-CM

## 2012-10-17 DIAGNOSIS — K2270 Barrett's esophagus without dysplasia: Secondary | ICD-10-CM

## 2012-10-17 DIAGNOSIS — K219 Gastro-esophageal reflux disease without esophagitis: Secondary | ICD-10-CM

## 2012-10-17 DIAGNOSIS — L608 Other nail disorders: Secondary | ICD-10-CM

## 2012-10-17 MED ORDER — TRIAMCINOLONE ACETONIDE 0.025 % EX OINT: TOPICAL_OINTMENT | Freq: Two times a day (BID) | CUTANEOUS | Status: DC

## 2012-10-17 NOTE — Telephone Encounter (Signed)
Use the oint I gave her for everything Thx

## 2012-10-17 NOTE — Assessment & Plan Note (Signed)
Continue with current prescription therapy as reflected on the Med list.  

## 2012-10-17 NOTE — Telephone Encounter (Signed)
Pt states you mentioned giving her a different cream for her skin. She states she showed you her hand and you mentioned another cream. Please send to her pharmacy.

## 2012-10-17 NOTE — Progress Notes (Signed)
   Subjective:    HPI C/o LBP - better with bending over x 1 year: 6-7/10 - worse after work C/oR hip pain with restricted ROM over past year F/u GERD C/o CP C/o moles  BP Readings from Last 3 Encounters:  10/17/12 120/82  07/15/12 150/100  04/27/12 130/78   Wt Readings from Last 3 Encounters:  10/17/12 185 lb (83.915 kg)  07/15/12 185 lb (83.915 kg)  04/27/12 180 lb (81.647 kg)     F/u HTN, GERD  Review of Systems  Constitutional: Positive for unexpected weight change (wt gain). Negative for activity change, appetite change and fatigue.  HENT: Negative for neck stiffness.   Eyes: Negative for discharge and visual disturbance.  Cardiovascular: Negative for chest pain and leg swelling.  Genitourinary: Negative for urgency.  Musculoskeletal: Positive for back pain and arthralgias.  Skin: Rash: R wrist and hand.       L thumb wart  Psychiatric/Behavioral: Negative for suicidal ideas, self-injury and dysphoric mood. The patient is not nervous/anxious.        Objective:   Physical Exam  Constitutional: She appears well-developed. No distress.       Obese  HENT:  Head: Normocephalic.  Right Ear: External ear normal.  Left Ear: External ear normal.  Nose: Nose normal.  Mouth/Throat: Oropharynx is clear and moist.  Eyes: Conjunctivae normal are normal. Pupils are equal, round, and reactive to light. Right eye exhibits no discharge. Left eye exhibits no discharge.  Neck: Normal range of motion. Neck supple. No JVD present. No tracheal deviation present. No thyromegaly present.  Cardiovascular: Normal rate, regular rhythm and normal heart sounds.   Pulmonary/Chest: No stridor. No respiratory distress. She has no wheezes.  Abdominal: Soft. Bowel sounds are normal. She exhibits no distension and no mass. There is no tenderness. There is no rebound and no guarding.  Musculoskeletal: She exhibits tenderness. She exhibits no edema.       R hip w/restricted with ROM    Lymphadenopathy:    She has no cervical adenopathy.  Neurological: She displays normal reflexes. No cranial nerve deficit. She exhibits normal muscle tone. Coordination normal.  Skin: No rash noted. No erythema.  Psychiatric: She has a normal mood and affect. Her behavior is normal. Judgment and thought content normal.  Dry skin on fingers AKs on hands   Procedure Note :     Procedure : Cryosurgery   Indication:  Actinic keratosis(es)   Risks including unsuccessful procedure , bleeding, infection, bruising, scar, a need for a repeat  procedure and others were explained to the patient in detail as well as the benefits. Informed consent was obtained verbally.    4 lesion(s)  on  B forearms  was/were treated with liquid nitrogen on a Q-tip in a usual fasion . Band-Aid was applied and antibiotic ointment was given for a later use.   Tolerated well. Complications none.   Postprocedure instructions :     Keep the wounds clean. You can wash them with liquid soap and water. Pat dry with gauze or a Kleenex tissue  Before applying antibiotic ointment and a Band-Aid.   You need to report immediately  if  any signs of infection develop.            Assessment & Plan:

## 2012-10-17 NOTE — Assessment & Plan Note (Signed)
Discussed.

## 2012-10-18 NOTE — Telephone Encounter (Signed)
Left detailed mess informing pt of below.  

## 2012-10-19 ENCOUNTER — Ambulatory Visit (INDEPENDENT_AMBULATORY_CARE_PROVIDER_SITE_OTHER): Payer: BC Managed Care – PPO | Admitting: Internal Medicine

## 2012-10-19 ENCOUNTER — Encounter: Payer: Self-pay | Admitting: Internal Medicine

## 2012-10-19 VITALS — BP 134/72 | HR 76 | Ht 62.0 in | Wt 188.0 lb

## 2012-10-19 DIAGNOSIS — K2270 Barrett's esophagus without dysplasia: Secondary | ICD-10-CM

## 2012-10-19 DIAGNOSIS — K2960 Other gastritis without bleeding: Secondary | ICD-10-CM
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
## 2012-10-19 NOTE — Patient Instructions (Addendum)
Dr Posey Rea may refill your Protonix in the future.  Dr Posey Rea

## 2013-04-09 ENCOUNTER — Other Ambulatory Visit: Payer: Self-pay | Admitting: Internal Medicine

## 2013-04-17 ENCOUNTER — Ambulatory Visit: Payer: BC Managed Care – PPO | Admitting: Internal Medicine

## 2013-05-01 ENCOUNTER — Ambulatory Visit: Payer: BC Managed Care – PPO | Admitting: Internal Medicine

## 2013-05-12 ENCOUNTER — Ambulatory Visit (INDEPENDENT_AMBULATORY_CARE_PROVIDER_SITE_OTHER): Payer: BC Managed Care – PPO | Admitting: Internal Medicine

## 2013-05-12 ENCOUNTER — Encounter: Payer: Self-pay | Admitting: Internal Medicine

## 2013-05-12 DIAGNOSIS — M79609 Pain in unspecified limb: Secondary | ICD-10-CM

## 2013-05-12 DIAGNOSIS — M79669 Pain in unspecified lower leg: Secondary | ICD-10-CM | POA: Insufficient documentation

## 2013-05-12 NOTE — Assessment & Plan Note (Signed)
8/14 prox post calf [place] LLE ven

## 2013-05-12 NOTE — Progress Notes (Signed)
Patient ID: Denise Donaldson, female   DOB: 14-Dec-1955, 57 y.o.   MRN: [id number]   Subjective:    Knee Pain  The incident occurred more than 1 week ago. There was no injury mechanism. The pain is present in the left leg and left knee. The pain is moderate. The pain has been intermittent since onset. Associated symptoms include numbness. Pertinent negatives include no inability to bear weight. She reports no foreign bodies present. The symptoms are aggravated by movement.   C/o LBP - better with bending over x 1 year: 6-7/10 - worse after work C/oR hip pain with restricted ROM over past year F/u GERD C/o CP C/o moles  BP Readings from Last 3 Encounters:  05/12/13 130/76  10/19/12 134/72  10/17/12 120/82   Wt Readings from Last 3 Encounters:  05/12/13 187 lb (84.823 kg)  10/19/12 188 lb (85.276 kg)  10/17/12 185 lb (83.915 kg)     F/u HTN, GERD  Review of Systems  Constitutional: Positive for unexpected weight change (wt gain). Negative for activity change, appetite change and fatigue.  HENT: Negative for neck stiffness.   Eyes: Negative for discharge and visual disturbance.  Cardiovascular: Negative for chest pain and leg swelling.  Genitourinary: Negative for urgency.  Musculoskeletal: Positive for back pain and arthralgias.  Skin: Rash: R wrist and hand.       L thumb wart  Neurological: Positive for numbness.  Psychiatric/Behavioral: Negative for suicidal ideas, self-injury and dysphoric mood. The patient is not nervous/anxious.        Objective:   Physical Exam  Constitutional: She appears well-developed. No distress.  Obese  HENT:  Head: Normocephalic.  Right Ear: External ear normal.  Left Ear: External ear normal.  Nose: Nose normal.  Mouth/Throat: Oropharynx is clear and moist.  Eyes: Conjunctivae are normal. Pupils are equal, round, and reactive to light. Right eye exhibits no discharge. Left eye exhibits no discharge.  Neck: Normal range of motion.  Neck supple. No JVD present. No tracheal deviation present. No thyromegaly present.  Cardiovascular: Normal rate, regular rhythm and normal heart sounds.   Pulmonary/Chest: No stridor. No respiratory distress. She has no wheezes.  Abdominal: Soft. Bowel sounds are normal. She exhibits no distension and no mass. There is no tenderness. There is no rebound and no guarding.  Musculoskeletal: She exhibits tenderness. She exhibits no edema.  R hip w/restricted with ROM  Lymphadenopathy:    She has no cervical adenopathy.  Neurological: She displays normal reflexes. No cranial nerve deficit. She exhibits normal muscle tone. Coordination normal.  Skin: No rash noted. No erythema.  Psychiatric: She has a normal mood and affect. Her behavior is normal. Judgment and thought content normal.  Dry skin on fingers L prox posterior calf is tender     Assessment & Plan:

## 2013-05-12 NOTE — Patient Instructions (Addendum)
Off work x 1 wk

## 2013-05-19 ENCOUNTER — Encounter (INDEPENDENT_AMBULATORY_CARE_PROVIDER_SITE_OTHER): Payer: BC Managed Care – PPO

## 2013-05-19 DIAGNOSIS — M79609 Pain in unspecified limb: Secondary | ICD-10-CM

## 2013-05-25 ENCOUNTER — Ambulatory Visit: Payer: BC Managed Care – PPO | Admitting: Internal Medicine

## 2013-05-26 ENCOUNTER — Ambulatory Visit: Payer: BC Managed Care – PPO | Admitting: Internal Medicine

## 2013-06-09 ENCOUNTER — Ambulatory Visit: Payer: BC Managed Care – PPO | Admitting: Internal Medicine

## 2013-06-19 ENCOUNTER — Encounter: Payer: Self-pay | Admitting: Internal Medicine

## 2013-06-19 ENCOUNTER — Ambulatory Visit (INDEPENDENT_AMBULATORY_CARE_PROVIDER_SITE_OTHER): Payer: BC Managed Care – PPO | Admitting: Internal Medicine

## 2013-06-19 DIAGNOSIS — M79609 Pain in unspecified limb: Secondary | ICD-10-CM

## 2013-06-19 DIAGNOSIS — M25569 Pain in unspecified knee: Secondary | ICD-10-CM

## 2013-06-19 DIAGNOSIS — M25562 Pain in left knee: Secondary | ICD-10-CM

## 2013-06-19 DIAGNOSIS — M545 Low back pain, unspecified: Secondary | ICD-10-CM

## 2013-06-19 DIAGNOSIS — I10 Essential (primary) hypertension: Secondary | ICD-10-CM

## 2013-06-19 MED ORDER — MELOXICAM 15 MG PO TABS: 15.0000 mg | ORAL_TABLET | Freq: Every day | ORAL | Status: DC | PRN

## 2013-06-19 NOTE — Assessment & Plan Note (Signed)
8/14 prox post calf 9/14 s/p Ortho eval She had a L knee tap/inj - not better

## 2013-06-19 NOTE — Progress Notes (Signed)
   Subjective:   C/o L knee pain x 2 months. S/p Ortho ref - she had a knee tap and a steroid inj w/PT - not better... (Dr Chestine Spore)  Knee Pain  The incident occurred more than 1 week ago. There was no injury mechanism. The pain is present in the left leg and left knee. The pain is moderate. The pain has been intermittent since onset. Associated symptoms include a loss of motion and numbness. Pertinent negatives include no inability to bear weight. She reports no foreign bodies present. The symptoms are aggravated by movement and weight bearing. The treatment provided no relief.   C/o LBP - better with bending over x 1 year: 6-7/10 - worse after work C/oR hip pain with restricted ROM over past year F/u GERD   BP Readings from Last 3 Encounters:  06/19/13 130/78  05/12/13 130/76  10/19/12 134/72   Wt Readings from Last 3 Encounters:  06/19/13 188 lb (85.276 kg)  05/12/13 187 lb (84.823 kg)  10/19/12 188 lb (85.276 kg)     F/u HTN, GERD  Review of Systems  Constitutional: Positive for unexpected weight change (wt gain). Negative for activity change, appetite change and fatigue.  HENT: Negative for neck stiffness.   Eyes: Negative for discharge and visual disturbance.  Cardiovascular: Negative for chest pain and leg swelling.  Genitourinary: Negative for urgency.  Musculoskeletal: Positive for back pain and arthralgias.  Skin: Rash: R wrist and hand.       L thumb wart  Neurological: Positive for numbness.  Psychiatric/Behavioral: Negative for suicidal ideas, self-injury and dysphoric mood. The patient is not nervous/anxious.        Objective:   Physical Exam  Constitutional: She appears well-developed. No distress.  Obese  HENT:  Head: Normocephalic.  Right Ear: External ear normal.  Left Ear: External ear normal.  Nose: Nose normal.  Mouth/Throat: Oropharynx is clear and moist.  Eyes: Conjunctivae are normal. Pupils are equal, round, and reactive to light. Right eye  exhibits no discharge. Left eye exhibits no discharge.  Neck: Normal range of motion. Neck supple. No JVD present. No tracheal deviation present. No thyromegaly present.  Cardiovascular: Normal rate, regular rhythm and normal heart sounds.   Pulmonary/Chest: No stridor. No respiratory distress. She has no wheezes.  Abdominal: Soft. Bowel sounds are normal. She exhibits no distension and no mass. There is no tenderness. There is no rebound and no guarding.  Musculoskeletal: She exhibits tenderness. She exhibits no edema.  R hip w/restricted with ROM  Lymphadenopathy:    She has no cervical adenopathy.  Neurological: She displays normal reflexes. No cranial nerve deficit. She exhibits normal muscle tone. Coordination normal.  Skin: No rash noted. No erythema.  Psychiatric: She has a normal mood and affect. Her behavior is normal. Judgment and thought content normal.  Dry skin on fingers L prox posterior calf is tender     Assessment & Plan:

## 2013-06-19 NOTE — Assessment & Plan Note (Signed)
Continue with current prescription therapy as reflected on the Med list.  

## 2013-06-19 NOTE — Assessment & Plan Note (Signed)
Better  

## 2013-06-27 ENCOUNTER — Ambulatory Visit
Admission: RE | Admit: 2013-06-27 | Discharge: 2013-06-27 | Disposition: A | Discharge status: Home / Self Care | Payer: BC Managed Care – PPO | Source: Ambulatory Visit | Attending: Internal Medicine | Admitting: Internal Medicine

## 2013-06-27 DIAGNOSIS — M25562 Pain in left knee: Secondary | ICD-10-CM

## 2013-06-29 ENCOUNTER — Encounter: Payer: Self-pay | Admitting: Internal Medicine

## 2013-07-18 ENCOUNTER — Ambulatory Visit: Payer: BC Managed Care – PPO | Admitting: Internal Medicine

## 2013-07-27 ENCOUNTER — Other Ambulatory Visit: Payer: Self-pay

## 2013-08-02 ENCOUNTER — Ambulatory Visit (INDEPENDENT_AMBULATORY_CARE_PROVIDER_SITE_OTHER): Payer: BC Managed Care – PPO

## 2013-08-02 VITALS — BP 133/72 | HR 82 | Resp 16 | Ht 62.21 in | Wt 184.0 lb

## 2013-08-02 DIAGNOSIS — L600 Ingrowing nail: Secondary | ICD-10-CM

## 2013-08-02 DIAGNOSIS — B351 Tinea unguium: Secondary | ICD-10-CM

## 2013-08-02 DIAGNOSIS — M79609 Pain in unspecified limb: Secondary | ICD-10-CM

## 2013-08-02 DIAGNOSIS — L03039 Cellulitis of unspecified toe: Secondary | ICD-10-CM

## 2013-08-02 MED ORDER — CLINDAMYCIN HCL 150 MG PO CAPS: 150.0000 mg | ORAL_CAPSULE | Freq: Three times a day (TID) | ORAL | Status: DC

## 2013-08-02 NOTE — Progress Notes (Signed)
  Subjective:    Patient ID: Denise Donaldson, female    DOB: Aug 02, 1956, 57 y.o.   MRN: [id number] "I have an ingrown nail."   HPI Comments: N  Sharp pain L  Ingrown hallux rt. D  15- 20 yrs. O  Gradually C  Gotten worse A  Closed toe shoe T  Paper/ cotton under the nail corner      Review of Systems  Cardiovascular:       Calf pain w/ walking  Musculoskeletal: Positive for gait problem.       Joint pain  Hematological: Bruises/bleeds easily.       Slow to heal  All other systems reviewed and are negative.       Objective:   Physical Exam Neurovascular status is intact with pedal pulses palpable DP and PT +2/4 bilateral. Capillary fill time 3 seconds all digits. Skin temperature is warm. No edema noted. Neurologically there is keratosis incurvation lateral border of the right hallux nail. There is also some yellowing and discoloration of the fifth digit nails bilateral. Patient had some infection in the past of the hallux nail no current discharge or drainage is noted although erythema the lateral nail fold is identified. Orthopedic biomechanical exam otherwise unremarkable noncontributory.       Assessment & Plan:  Assessment this time is ingrowing nail with possible history paronychia lateral border right great toe. Also history of onychomycosis fifth digits bilateral. May recommendations for topical formula 3 to the affected fifth digits bilateral suggested followup in the future on an as-needed basis. As far as the great toe recommendations permanent nail excision lateral border with phenol matricectomy. At this time local anesthetic in Mr. total of 3 cc 50-50 mixture of 2% Xylocaine plain and 0.5% Marcaine plain. Betadine prep was performed the lateral border was excised, phenol matricectomy x3 applications was performed followed by alcohol wash Silvadene cream being applied and dry sterile compressive dressing. Patient tolerated procedure well was given instructions for  daily Betadine warm water soaks prescription for clindamycin is forwarded to the pharmacy. Recommended Tylenol as needed for pain. Recheck in 2-3 weeks for followup and nail check. Contact us with any changes or exacerbations in the interim. Next  Alvan Dame DPM

## 2013-08-02 NOTE — Patient Instructions (Signed)

## 2013-08-09 ENCOUNTER — Ambulatory Visit: Payer: BC Managed Care – PPO | Admitting: Internal Medicine

## 2013-08-25 ENCOUNTER — Ambulatory Visit: Payer: BC Managed Care – PPO

## 2014-03-12 ENCOUNTER — Ambulatory Visit (INDEPENDENT_AMBULATORY_CARE_PROVIDER_SITE_OTHER): Payer: BC Managed Care – PPO | Admitting: Internal Medicine

## 2014-03-12 ENCOUNTER — Encounter: Payer: Self-pay | Admitting: Internal Medicine

## 2014-03-12 VITALS — BP 120/74 | HR 76 | Temp 98.2°F | Resp 16 | Wt 182.0 lb

## 2014-03-12 DIAGNOSIS — R609 Edema, unspecified: Secondary | ICD-10-CM

## 2014-03-12 DIAGNOSIS — K2270 Barrett's esophagus without dysplasia: Secondary | ICD-10-CM

## 2014-03-12 DIAGNOSIS — M25569 Pain in unspecified knee: Secondary | ICD-10-CM

## 2014-03-12 DIAGNOSIS — R21 Rash and other nonspecific skin eruption: Secondary | ICD-10-CM

## 2014-03-12 DIAGNOSIS — M25562 Pain in left knee: Secondary | ICD-10-CM

## 2014-03-12 MED ORDER — TRIAMCINOLONE ACETONIDE 0.025 % EX OINT: TOPICAL_OINTMENT | Freq: Two times a day (BID) | CUTANEOUS | Status: DC

## 2014-03-12 NOTE — Assessment & Plan Note (Addendum)
  9/14 s/p Ortho eval She had a L knee taping/inj - better. OK to go back to work w/o restrictions on 03/19/14  Better: OK to go back to work

## 2014-03-12 NOTE — Assessment & Plan Note (Signed)
Triamcinolone bid prn

## 2014-03-12 NOTE — Assessment & Plan Note (Signed)
Better  OK to go back to work

## 2014-03-12 NOTE — Progress Notes (Signed)
Pre visit review using our clinic review tool, if applicable. No additional management support is needed unless otherwise documented below in the visit note. 

## 2014-03-17 NOTE — Assessment & Plan Note (Signed)
Better  

## 2014-03-17 NOTE — Assessment & Plan Note (Signed)
Continue with current prescription therapy as reflected on the Med list.  

## 2014-03-17 NOTE — Progress Notes (Signed)
   Subjective:   C/o L knee pain x 6+ months. S/p Ortho ref - she had a knee tap and a steroid inj w/PT (Dr Carlis Abbott)  HPI C/o LBP - better with bending over x 1 year: 6-7/10 - worse after work C/oR hip pain with restricted ROM over past year F/u GERD   BP Readings from Last 3 Encounters:  03/12/14 120/74  08/02/13 133/72  06/19/13 130/78   Wt Readings from Last 3 Encounters:  03/12/14 182 lb (82.555 kg)  08/02/13 184 lb (83.462 kg)  06/19/13 188 lb (85.276 kg)     F/u HTN, GERD  Review of Systems  Constitutional: Positive for unexpected weight change (wt gain). Negative for activity change, appetite change and fatigue.  Eyes: Negative for discharge and visual disturbance.  Cardiovascular: Negative for chest pain and leg swelling.  Genitourinary: Negative for urgency.  Musculoskeletal: Positive for arthralgias and back pain. Negative for neck stiffness.  Skin: Rash: R wrist and hand.       L thumb wart  Psychiatric/Behavioral: Negative for suicidal ideas, self-injury and dysphoric mood. The patient is not nervous/anxious.        Objective:   Physical Exam  Constitutional: She appears well-developed. No distress.  Obese  HENT:  Head: Normocephalic.  Right Ear: External ear normal.  Left Ear: External ear normal.  Nose: Nose normal.  Mouth/Throat: Oropharynx is clear and moist.  Eyes: Conjunctivae are normal. Pupils are equal, round, and reactive to light. Right eye exhibits no discharge. Left eye exhibits no discharge.  Neck: Normal range of motion. Neck supple. No JVD present. No tracheal deviation present. No thyromegaly present.  Cardiovascular: Normal rate, regular rhythm and normal heart sounds.   Pulmonary/Chest: No stridor. No respiratory distress. She has no wheezes.  Abdominal: Soft. Bowel sounds are normal. She exhibits no distension and no mass. There is no tenderness. There is no rebound and no guarding.  Musculoskeletal: She exhibits tenderness. She  exhibits no edema.  R hip w/restricted with ROM  Lymphadenopathy:    She has no cervical adenopathy.  Neurological: She displays normal reflexes. No cranial nerve deficit. She exhibits normal muscle tone. Coordination normal.  Skin: No rash noted. No erythema.  Psychiatric: She has a normal mood and affect. Her behavior is normal. Judgment and thought content normal.  Dry skin on fingers L prox posterior calf is less tender     Assessment & Plan:

## 2014-08-03 ENCOUNTER — Other Ambulatory Visit: Payer: Self-pay

## 2014-08-03 MED ORDER — PANTOPRAZOLE SODIUM 40 MG PO TBEC: DELAYED_RELEASE_TABLET | ORAL | Status: DC

## 2014-08-06 ENCOUNTER — Telehealth: Payer: Self-pay | Admitting: Internal Medicine

## 2014-08-06 MED ORDER — PANTOPRAZOLE SODIUM 40 MG PO TBEC: DELAYED_RELEASE_TABLET | ORAL | Status: DC

## 2014-08-06 MED ORDER — AMLODIPINE BESYLATE 5 MG PO TABS: ORAL_TABLET | ORAL | Status: DC

## 2014-08-06 NOTE — Telephone Encounter (Signed)
Needs Rx ref

## 2014-11-30 ENCOUNTER — Encounter: Payer: Self-pay | Admitting: Internal Medicine

## 2014-12-24 ENCOUNTER — Other Ambulatory Visit: Payer: Self-pay | Admitting: *Deleted

## 2014-12-24 ENCOUNTER — Telehealth: Payer: Self-pay | Admitting: Internal Medicine

## 2014-12-24 MED ORDER — TRIAMCINOLONE ACETONIDE 0.025 % EX OINT: TOPICAL_OINTMENT | Freq: Two times a day (BID) | CUTANEOUS | Status: DC

## 2014-12-24 MED ORDER — LOSARTAN POTASSIUM 100 MG PO TABS: 100.0000 mg | ORAL_TABLET | Freq: Every day | ORAL | Status: DC

## 2014-12-24 NOTE — Telephone Encounter (Signed)
Elev BP at home 160/90 Start Losartan Thx

## 2015-03-06 ENCOUNTER — Telehealth: Payer: Self-pay | Admitting: Geriatric Medicine

## 2015-03-06 NOTE — Telephone Encounter (Signed)
Spoke with patient and she has not had a mammogram in a few years, but is not able to get one because her deductible on her insurance policy is too high.

## 2015-04-03 ENCOUNTER — Other Ambulatory Visit: Payer: Self-pay | Admitting: Internal Medicine

## 2015-04-03 MED ORDER — LOSARTAN POTASSIUM 50 MG PO TABS: 50.0000 mg | ORAL_TABLET | Freq: Every day | ORAL | Status: DC

## 2015-05-21 ENCOUNTER — Encounter: Payer: Self-pay | Admitting: Internal Medicine

## 2015-08-22 ENCOUNTER — Other Ambulatory Visit: Payer: Self-pay | Admitting: Internal Medicine

## 2016-01-21 ENCOUNTER — Encounter: Payer: BLUE CROSS/BLUE SHIELD | Admitting: Internal Medicine

## 2016-04-02 ENCOUNTER — Telehealth: Payer: Self-pay | Admitting: Internal Medicine

## 2016-04-02 MED ORDER — PANTOPRAZOLE SODIUM 40 MG PO TBEC: DELAYED_RELEASE_TABLET | ORAL | Status: DC

## 2016-04-02 NOTE — Telephone Encounter (Signed)
[facility] called advising that RX needs PA. Please follow up

## 2016-04-02 NOTE — Telephone Encounter (Signed)
Needs Rx

## 2016-04-03 NOTE — Telephone Encounter (Signed)
PA APPROVED, pt advised, pharmacy to be notified via CoverMyMeds

## 2016-04-03 NOTE — Telephone Encounter (Signed)
PA initiated via CoverMyMeds key [phone]

## 2016-06-01 ENCOUNTER — Ambulatory Visit (INDEPENDENT_AMBULATORY_CARE_PROVIDER_SITE_OTHER): Payer: BLUE CROSS/BLUE SHIELD | Admitting: Internal Medicine

## 2016-06-01 ENCOUNTER — Ambulatory Visit (INDEPENDENT_AMBULATORY_CARE_PROVIDER_SITE_OTHER)
Admission: RE | Admit: 2016-06-01 | Discharge: 2016-06-01 | Disposition: A | Discharge status: Home / Self Care | Payer: BLUE CROSS/BLUE SHIELD | Source: Ambulatory Visit | Attending: Internal Medicine | Admitting: Internal Medicine

## 2016-06-01 ENCOUNTER — Encounter: Payer: Self-pay | Admitting: Internal Medicine

## 2016-06-01 ENCOUNTER — Other Ambulatory Visit (INDEPENDENT_AMBULATORY_CARE_PROVIDER_SITE_OTHER): Payer: BLUE CROSS/BLUE SHIELD

## 2016-06-01 VITALS — BP 140/82 | HR 72 | Wt 186.0 lb

## 2016-06-01 DIAGNOSIS — I10 Essential (primary) hypertension: Secondary | ICD-10-CM

## 2016-06-01 DIAGNOSIS — R079 Chest pain, unspecified: Secondary | ICD-10-CM | POA: Diagnosis not present

## 2016-06-01 DIAGNOSIS — R0789 Other chest pain: Secondary | ICD-10-CM

## 2016-06-01 DIAGNOSIS — K2100 Gastro-esophageal reflux disease with esophagitis, without bleeding: Secondary | ICD-10-CM

## 2016-06-01 DIAGNOSIS — K2270 Barrett's esophagus without dysplasia: Secondary | ICD-10-CM | POA: Diagnosis not present

## 2016-06-01 DIAGNOSIS — Z23 Encounter for immunization: Secondary | ICD-10-CM | POA: Diagnosis not present

## 2016-06-01 LAB — BASIC METABOLIC PANEL
BUN: 11 mg/dL (ref 6–23)
CHLORIDE: 105 meq/L (ref 96–112)
CO2: 25 meq/L (ref 19–32)
Calcium: 9.2 mg/dL (ref 8.4–10.5)
Creatinine, Ser: 0.66 mg/dL (ref 0.40–1.20)
GFR: 96.31 mL/min (ref >60.00)
Glucose, Bld: 99 mg/dL (ref 70–99)
POTASSIUM: 4 meq/L (ref 3.5–5.1)
SODIUM: 138 meq/L (ref 135–145)

## 2016-06-01 LAB — TSH: TSH: 1 u[IU]/mL (ref 0.35–4.50)

## 2016-06-01 LAB — CBC WITH DIFFERENTIAL/PLATELET
BASOS PCT: 0.6 % (ref 0.0–3.0)
Basophils Absolute: 0.1 10*3/uL (ref 0.0–0.1)
EOS PCT: 3 % (ref 0.0–5.0)
Eosinophils Absolute: 0.3 10*3/uL (ref 0.0–0.7)
HEMATOCRIT: 44.8 % (ref 36.0–46.0)
HEMOGLOBIN: 15.2 g/dL — AB (ref 12.0–15.0)
LYMPHS PCT: 35.5 % (ref 12.0–46.0)
Lymphs Abs: 3.1 10*3/uL (ref 0.7–4.0)
MCHC: 33.9 g/dL (ref 30.0–36.0)
MCV: 89.2 fl (ref 78.0–100.0)
MONOS PCT: 7.9 % (ref 3.0–12.0)
Monocytes Absolute: 0.7 10*3/uL (ref 0.1–1.0)
NEUTROS ABS: 4.6 10*3/uL (ref 1.4–7.7)
Neutrophils Relative %: 53 % (ref 43.0–77.0)
PLATELETS: 418 10*3/uL — AB (ref 150.0–400.0)
RBC: 5.02 Mil/uL (ref 3.87–5.11)
RDW: 13 % (ref 11.5–15.5)
WBC: 8.6 10*3/uL (ref 4.0–10.5)

## 2016-06-01 LAB — HEPATIC FUNCTION PANEL
ALT: 32 U/L (ref 0–35)
AST: 24 U/L (ref 0–37)
Albumin: 4.2 g/dL (ref 3.5–5.2)
Alkaline Phosphatase: 55 U/L (ref 39–117)
Bilirubin, Direct: 0 mg/dL (ref 0.0–0.3)
TOTAL PROTEIN: 7.6 g/dL (ref 6.0–8.3)
Total Bilirubin: 0.3 mg/dL (ref 0.2–1.2)

## 2016-06-01 MED ORDER — CARVEDILOL 6.25 MG PO TABS: 6.2500 mg | ORAL_TABLET | Freq: Two times a day (BID) | ORAL | 3 refills | Status: DC

## 2016-06-01 NOTE — Assessment & Plan Note (Signed)
Recurrent - likely GI related EKG OK Pt declined stress test Coreg added

## 2016-06-01 NOTE — Progress Notes (Signed)
Pre visit review using our clinic review tool, if applicable. No additional management support is needed unless otherwise documented below in the visit note. 

## 2016-06-01 NOTE — Assessment & Plan Note (Addendum)
On Protonix GI ref  Potential benefits of a long term PPI use as well as potential risks  and complications were explained to the patient and were aknowledged.

## 2016-06-01 NOTE — Progress Notes (Signed)
Subjective:  Patient ID: Denise Donaldson, female    DOB: 31-May-1956  Age: 60 y.o. MRN: [id number]  CC: No chief complaint on file.   HPI Denise Donaldson presents for HTN C/o CP and SOB at times since last week: palpitations, elevated BP  Outpatient Medications Prior to Visit  Medication Sig Dispense Refill  . amLODipine (NORVASC) 5 MG tablet TAKE 1 TABLET BY MOUTH ONCE A DAY 30 tablet 0  . aspirin 81 MG tablet Take 81 mg by mouth as needed.    . Cholecalciferol 1000 UNITS tablet Take 1 tablet (1,000 Units total) by mouth daily. 100 tablet 3  . losartan (COZAAR) 50 MG tablet Take 1 tablet (50 mg total) by mouth daily. 30 tablet 11  . pantoprazole (PROTONIX) 40 MG tablet TAKE ONE TABLET BY MOUTH EVERY DAY 30 tablet 11  . triamcinolone (KENALOG) 0.025 % ointment Apply topically 2 (two) times daily. 30 g 1   No facility-administered medications prior to visit.     ROS Review of Systems  Constitutional: Negative for activity change, appetite change, chills, fatigue and unexpected weight change.  HENT: Negative for congestion, mouth sores and sinus pressure.   Eyes: Negative for visual disturbance.  Respiratory: Negative for cough and chest tightness.   Gastrointestinal: Negative for abdominal pain and nausea.  Genitourinary: Negative for difficulty urinating, frequency and vaginal pain.  Musculoskeletal: Negative for back pain and gait problem.  Skin: Negative for pallor and rash.  Neurological: Negative for dizziness, tremors, weakness, numbness and headaches.  Psychiatric/Behavioral: Negative for confusion and sleep disturbance.    Objective:  BP 140/82   Pulse 72   Wt 186 lb (84.4 kg)   SpO2 98%   BMI 33.80 kg/m   BP Readings from Last 3 Encounters:  06/01/16 140/82  03/12/14 120/74  08/02/13 133/72    Wt Readings from Last 3 Encounters:  06/01/16 186 lb (84.4 kg)  03/12/14 182 lb (82.6 kg)  08/02/13 184 lb (83.5 kg)    Physical Exam  Constitutional:  She appears well-developed. No distress.  HENT:  Head: Normocephalic.  Right Ear: External ear normal.  Left Ear: External ear normal.  Nose: Nose normal.  Mouth/Throat: Oropharynx is clear and moist.  Eyes: Conjunctivae are normal. Pupils are equal, round, and reactive to light. Right eye exhibits no discharge. Left eye exhibits no discharge.  Neck: Normal range of motion. Neck supple. No JVD present. No tracheal deviation present. No thyromegaly present.  Cardiovascular: Normal rate, regular rhythm and normal heart sounds.   Pulmonary/Chest: No stridor. No respiratory distress. She has no wheezes.  Abdominal: Soft. Bowel sounds are normal. She exhibits no distension and no mass. There is no tenderness. There is no rebound and no guarding.  Musculoskeletal: She exhibits no edema or tenderness.  Lymphadenopathy:    She has no cervical adenopathy.  Neurological: She displays normal reflexes. No cranial nerve deficit. She exhibits normal muscle tone. Coordination normal.  Skin: No rash noted. No erythema.  Psychiatric: She has a normal mood and affect. Her behavior is normal. Judgment and thought content normal.   Procedure: EKG Indication: chest pain Impression: NSR. No acute changes.  Lab Results  Component Value Date   WBC 5.9 02/04/2011   HGB 14.7 02/04/2011   HCT 42.6 02/04/2011   PLT 375.0 02/04/2011   GLUCOSE 90 02/04/2011   ALT 31 02/04/2011   AST 24 02/04/2011   NA 140 02/04/2011   K 4.4 02/04/2011   CL 106 02/04/2011   CREATININE  0.7 02/04/2011   BUN 11 02/04/2011   CO2 26 02/04/2011   TSH 0.55 02/04/2011    Mr Knee Left  Wo Contrast  Result Date: 06/27/2013 CLINICAL DATA:  Left posterior knee pain. Pain radiating to the leg. EXAM: MRI OF THE LEFT KNEE WITHOUT CONTRAST TECHNIQUE: Multiplanar, multisequence MR imaging of the knee was performed. No intravenous contrast was administered. COMPARISON:  None. FINDINGS: MENISCI Medial meniscus:  Intact. Lateral meniscus:   Intact. LIGAMENTS Cruciates:  Intact. Collaterals:  Intact. CARTILAGE Patellofemoral: In the inferior lateral patellar facet, there is a 3 mm chondral fissure identified on sagittal imaging. This appears to be grade III chondromalacia with subchondral marrow edema. Medial:  Normal. Lateral:  Normal. Joint: Small effusion. Tiny cystic lesion is present anterior to the anterior horn of the medial meniscus. This probably represents a small ganglion and measures 9 mm x 6 mm x 5 mm. The adjacent anterior horn of the medial meniscus appears normal. Popliteal Fossa:  Small uncomplicated Baker's cyst. Extensor Mechanism:  Normal. Bones:  Scattered areas of faint active red marrow. IMPRESSION: 1. Mild patellofemoral osteoarthritis with grade III inferior lateral patellar facet chondromalacia. Mild subchondral edema. 2. Small effusion is probably reactive. 3. Small cluster of cysts anterior to the anterior horn of the medial meniscus is favored to represent a ganglion rather than parameniscal cysts and occult meniscal tear. Electronically Signed   By: Dereck Ligas M.D.   On: 06/27/2013 12:36    Assessment & Plan:   There are no diagnoses linked to this encounter. I am having Ms. Pena maintain her Cholecalciferol, aspirin, triamcinolone, losartan, amLODipine, and pantoprazole.  No orders of the defined types were placed in this encounter.    Follow-up: No Follow-up on file.  Walker Kehr, MD

## 2016-06-01 NOTE — Assessment & Plan Note (Signed)
Worse. GI f/u On Protnix

## 2016-06-01 NOTE — Assessment & Plan Note (Addendum)
Worse Amlodipine. Added Coreg Losartan - taking not regular - d/c'd

## 2016-06-02 ENCOUNTER — Encounter: Payer: Self-pay | Admitting: Internal Medicine

## 2016-07-02 ENCOUNTER — Encounter: Payer: Self-pay | Admitting: Gastroenterology

## 2016-08-18 ENCOUNTER — Telehealth: Payer: Self-pay | Admitting: Internal Medicine

## 2016-08-18 MED ORDER — TRIAMCINOLONE ACETONIDE 0.025 % EX OINT: TOPICAL_OINTMENT | Freq: Two times a day (BID) | CUTANEOUS | 2 refills | Status: DC

## 2016-08-18 NOTE — Telephone Encounter (Signed)
Needs Kenalog Rx ref

## 2016-09-08 ENCOUNTER — Ambulatory Visit (INDEPENDENT_AMBULATORY_CARE_PROVIDER_SITE_OTHER): Payer: BLUE CROSS/BLUE SHIELD | Admitting: Gastroenterology

## 2016-09-08 ENCOUNTER — Encounter: Payer: Self-pay | Admitting: Gastroenterology

## 2016-09-08 ENCOUNTER — Other Ambulatory Visit: Payer: BLUE CROSS/BLUE SHIELD

## 2016-09-08 VITALS — BP 136/84 | HR 80 | Ht 62.0 in | Wt 183.1 lb

## 2016-09-08 DIAGNOSIS — R1013 Epigastric pain: Secondary | ICD-10-CM

## 2016-09-08 DIAGNOSIS — R198 Other specified symptoms and signs involving the digestive system and abdomen: Secondary | ICD-10-CM

## 2016-09-08 DIAGNOSIS — R0789 Other chest pain: Secondary | ICD-10-CM

## 2016-09-08 DIAGNOSIS — K219 Gastro-esophageal reflux disease without esophagitis: Secondary | ICD-10-CM

## 2016-09-08 MED ORDER — RANITIDINE HCL 300 MG PO TABS: 300.0000 mg | ORAL_TABLET | Freq: Every day | ORAL | 3 refills | Status: DC

## 2016-09-08 NOTE — Patient Instructions (Addendum)
You have been scheduled for a gastric emptying scan at [facility] on 08/17/2016 at 9:30am. Please arrive at least 15 minutes prior to your appointment for registration. Please make certain not to have anything to eat or drink after midnight the night before your test. Hold all stomach medications (ex: Zofran, phenergan, Reglan) 48 hours prior to your test. If you need to reschedule your appointment, please contact radiology scheduling at [phone]. _____________________________________________________________________ A gastric-emptying study measures how long it takes for food to move through your stomach. There are several ways to measure stomach emptying. In the most common test, you eat food that contains a small amount of radioactive material. A scanner that detects the movement of the radioactive material is placed over your abdomen to monitor the rate at which food leaves your stomach. This test normally takes about 4 hours to complete. _____________________________________________________________________   We will send Zantac to your pharmacy   Gastroparesis Introduction Gastroparesis, also called delayed gastric emptying, is a condition in which food takes longer than normal to empty from the stomach. The condition is usually long-lasting (chronic). What are the causes? This condition may be caused by:  An endocrine disorder, such as hypothyroidism or diabetes. Diabetes is the most common cause of this condition.  A nervous system disease, such as Parkinson disease or multiple sclerosis.  Cancer, infection, or surgery of the stomach or vagus nerve.  A connective tissue disorder, such as scleroderma.  Certain medicines. In most cases, the cause is not known. What increases the risk? This condition is more likely to develop in:  People with certain disorders, including endocrine disorders, eating disorders, amyloidosis, and scleroderma.  People with certain diseases,  including Parkinson disease or multiple sclerosis.  People with cancer or infection of the stomach or vagus nerve.  People who have had surgery on the stomach or vagus nerve.  People who take certain medicines.  Women. What are the signs or symptoms? Symptoms of this condition include:  An early feeling of fullness when eating.  Nausea.  Weight loss.  Vomiting.  Heartburn.  Abdominal bloating.  Inconsistent blood glucose levels.  Lack of appetite.  Acid from the stomach coming up into the esophagus (gastroesophageal reflux).  Spasms of the stomach. Symptoms may come and go. How is this diagnosed? This condition is diagnosed with tests, such as:  Tests that check how long it takes food to move through the stomach and intestines. These tests include:  Upper gastrointestinal (GI) series. In this test, X-rays of the intestines are taken after you drink a liquid. The liquid makes the intestines show up better on the X-rays.  Gastric emptying scintigraphy. In this test, scans are taken after you eat food that contains a small amount of radioactive material.  Wireless capsule GI monitoring system. This test involves swallowing a capsule that records information about movement through the stomach.  Gastric manometry. This test measures electrical and muscular activity in the stomach. It is done with a thin tube that is passed down the throat and into the stomach.  Endoscopy. This test checks for abnormalities in the lining of the stomach. It is done with a long, thin tube that is passed down the throat and into the stomach.  An ultrasound. This test can help rule out gallbladder disease or pancreatitis as a cause of your symptoms. It uses sound waves to take pictures of the inside of your body. How is this treated? There is no cure for gastroparesis. This condition may be  managed with:  Treatment of the underlying condition causing the gastroparesis.  Lifestyle changes,  including exercise and dietary changes. Dietary changes can include:  Changes in what and when you eat.  Eating smaller meals more often.  Eating low-fat foods.  Eating low-fiber forms of high-fiber foods, such as cooked vegetables instead of raw vegetables.  Having liquid foods in place of solid foods. Liquid foods are easier to digest.  Medicines. These may be given to control nausea and vomiting and to stimulate stomach muscles.  Getting food through a feeding tube. This may be done in severe cases.  A gastric neurostimulator. This is a device that is inserted into the body with surgery. It helps improve stomach emptying and control nausea and vomiting. Follow these instructions at home:  Follow your health care provider's instructions about exercise and diet.  Take medicines only as directed by your health care provider. Contact a health care provider if:  Your symptoms do not improve with treatment.  You have new symptoms. Get help right away if:  You have severe abdominal pain that does not improve with treatment.  You have nausea that does not go away.  You cannot keep fluids down. This information is not intended to replace advice given to you by your health care provider. Make sure you discuss any questions you have with your health care provider. Document Released: 09/07/2005 Document Revised: 02/13/2016 Document Reviewed: 09/03/2014  2017 [employer]   Go to the basement for labs today

## 2016-09-08 NOTE — Progress Notes (Signed)
Denise Donaldson    BT:[id number]    [birth year]  Primary Care Physician:Alex Plotnikov, MD  Referring Physician: Cassandria Anger, MD [place], [place] [address]  Chief complaint: GERD  HPI: 60 year-old white female with history of chronic GERD is here with complaints of worsening reflux symptoms, she was previously followed by Dr. Olevia Perches last seen in January 2014. She has history of ?intestinal metaplasia (Barrett's esophagus) on n upper endoscopy 2011, which was not confirmed on the last endoscopy in August 2013. She was treated for H. pylori in 2011. Biopsies from EGD in 2013 negative for H. Pylori. She intermittently has sensation of choking with both liquids and solids. She has constant sour/bitter taste in her mouth which is different compared to her prior reflux symptoms . She is also having intermittent retrosternal chest pains , and extensive cardiac evaluation and was not thought to be cardiac related . Patient also complaining of epigastric pain and postprandial fullness even after small meals and remains full for many hours. Denies any nausea or vomiting. No history of constipation, diarrhea, melena or blood per rectum. She is taking Protonix 40 mg once daily , and it is helping with most of her symptoms.  Her colonoscopy in August 2013 showed a hyperplastic polyp.   Outpatient Encounter Prescriptions as of 09/08/2016  Medication Sig  . amLODipine (NORVASC) 5 MG tablet TAKE 1 TABLET BY MOUTH ONCE A DAY  . aspirin 81 MG tablet Take 81 mg by mouth as needed.  . carvedilol (COREG) 6.25 MG tablet Take 1 tablet (6.25 mg total) by mouth 2 (two) times daily with a meal.  . Cholecalciferol 1000 UNITS tablet Take 1 tablet (1,000 Units total) by mouth daily.  . pantoprazole (PROTONIX) 40 MG tablet TAKE ONE TABLET BY MOUTH EVERY DAY  . triamcinolone (KENALOG) 0.025 % ointment Apply topically 2 (two) times daily.   No facility-administered encounter medications on  file as of 09/08/2016.     Allergies as of 09/08/2016 - Review Complete 09/08/2016  Allergen Reaction Noted  . Losartan  06/01/2016  . Metoprolol tartrate  05/06/2009  . Penicillins      Past Medical History:  Diagnosis Date  . Costochondral chest pain    Left  . Diverticulosis   . Foot fracture, right    Dr Louanne Skye  . GERD (gastroesophageal reflux disease)    Dr Olevia Perches, EGD 2011 WNL  . Helicobacter pylori gastritis   . History of pneumonia   . HTN (hypertension)   . Hyperplastic colon polyp   . Menopausal disorder    After 33 Dr Corinna Capra  . Obesity   . Pectus excavatum   . Venous insufficiency    Bilat LE    Past Surgical History:  Procedure Laterality Date  . APPENDECTOMY    . CARDIAC CATHETERIZATION    . CESAREAN SECTION    . COLONOSCOPY  04/27/2012   Procedure: COLONOSCOPY;  Surgeon: Lafayette Dragon, MD;  Location: [facility];  Service: Endoscopy;  Laterality: N/A;  . ESOPHAGOGASTRODUODENOSCOPY  04/27/2012   Procedure: ESOPHAGOGASTRODUODENOSCOPY (EGD);  Surgeon: Lafayette Dragon, MD;  Location: [facility];  Service: Endoscopy;  Laterality: N/A;  . OVARIAN CYST REMOVAL      Family History  Problem Relation Age of Onset  . Prostate cancer Father   . Heart disease Father   . Hypertension Sister   . Prostate cancer Paternal Grandfather 27  . Colon cancer Paternal Uncle   .  Stomach cancer Paternal Uncle 37  . Diabetes Mother   . Diabetes Maternal Aunt   . Heart disease Maternal Grandmother   . Stroke Paternal Grandmother   . Esophageal cancer Neg Hx   . Liver cancer Neg Hx   . Rectal cancer Neg Hx     Social History   Social History  . Marital status: Married    Spouse name: N/A  . Number of children: 3  . Years of education: N/A   Occupational History  . retired    Social History Main Topics  . Smoking status: Never Smoker  . Smokeless tobacco: Never Used  . Alcohol use No  . Drug use: No  . Sexual activity: Yes    Birth control/ protection:  Post-menopausal   Other Topics Concern  . Not on file   Social History Narrative   Daily Caffeine - 2/day   Regular Exercise -  NO      Review of systems: Review of Systems  Constitutional: Negative for fever and chills.  HENT: Negative.   Eyes: Negative for blurred vision.  Respiratory: Negative for cough, shortness of breath and wheezing.   Cardiovascular: Negative for chest pain and palpitations.  Gastrointestinal: as per HPI Genitourinary: Negative for dysuria, urgency, frequency and hematuria.  Musculoskeletal: Negative for myalgias, back pain and joint pain.  Skin: Negative for itching and rash.  Neurological: Negative for dizziness, tremors, focal weakness, seizures and loss of consciousness.  Endo/Heme/Allergies: Positive for seasonal allergies.  Psychiatric/Behavioral: Negative for depression, suicidal ideas and hallucinations.  All other systems reviewed and are negative.   Physical Exam: Vitals:   09/08/16 1433  BP: 136/84  Pulse: 80   Body mass index is 33.49 kg/m. Gen:      No acute distress HEENT:  EOMI, sclera anicteric Neck:     No masses; no thyromegaly Lungs:    Clear to auscultation bilaterally; normal respiratory effort CV:         Regular rate and rhythm; no murmurs Abd:      + bowel sounds; soft, non-tender; no palpable masses, no distension Ext:    No edema; adequate peripheral perfusion Skin:      Warm and dry; no rash Neuro: alert and oriented x 3 Psych: normal mood and affect  Data Reviewed:  Reviewed labs, radiology imaging, old records and pertinent past GI work up   Assessment and Plan/Recommendations: 60 year old female with history of chronic GERD here with complaints of epigastric/retrosternal pain, postprandial fullness,Dyspepsia and bitter/sour taste  Most of her symptoms could be related to persistent partially controlled GERD versus functional dyspepsia  Patient did not have H. pylori on most recent gastric biopsies, will  check stool antigen to confirm eradication  Advised patient to continue Protonix 40 mg daily 30 minutes before breakfast and add ranitidine 300 mg at bedtime Follow antireflux measures Discussed weight loss and exercise but patient said that she cannot lose any weight no matter what she does   We will check gastric emptying scan to exclude gastroparesis as could be contributing to worsening GERD symptoms Patient was given gastroparesis diet If continues to have persistent symptoms, will consider EGD for further evaluation  Greater than 50% of the time used for counseling as well as treatment plan and follow-up. She had multiple questions which were answered to her satisfaction  K. Denzil Magnuson , MD [phone] Mon-Fri 8a-5p [phone] after 5p, weekends, holidays  CC: Plotnikov, Evie Lacks, MD

## 2016-09-16 ENCOUNTER — Ambulatory Visit (HOSPITAL_COMMUNITY)
Admission: RE | Admit: 2016-09-16 | Discharge: 2016-09-16 | Disposition: A | Discharge status: Home / Self Care | Payer: BLUE CROSS/BLUE SHIELD | Source: Ambulatory Visit | Attending: Gastroenterology | Admitting: Gastroenterology

## 2016-09-16 DIAGNOSIS — R1013 Epigastric pain: Secondary | ICD-10-CM | POA: Insufficient documentation

## 2016-09-16 DIAGNOSIS — R1031 Right lower quadrant pain: Secondary | ICD-10-CM | POA: Diagnosis not present

## 2016-09-16 MED ORDER — TECHNETIUM TC 99M SULFUR COLLOID
2.0000 | Freq: Once | INTRAVENOUS | Status: AC | PRN
  Administered 2016-09-16: 2.2 via ORAL

## 2016-09-17 ENCOUNTER — Other Ambulatory Visit: Payer: BLUE CROSS/BLUE SHIELD

## 2016-09-17 DIAGNOSIS — R1013 Epigastric pain: Secondary | ICD-10-CM | POA: Diagnosis not present

## 2016-09-18 LAB — HELICOBACTER PYLORI  SPECIAL ANTIGEN: H. PYLORI Antigen: NOT DETECTED

## 2016-10-21 ENCOUNTER — Other Ambulatory Visit: Payer: Self-pay | Admitting: Internal Medicine

## 2016-10-26 DIAGNOSIS — Z1231 Encounter for screening mammogram for malignant neoplasm of breast: Secondary | ICD-10-CM | POA: Diagnosis not present

## 2016-10-28 ENCOUNTER — Other Ambulatory Visit: Payer: Self-pay | Admitting: Obstetrics and Gynecology

## 2016-10-28 DIAGNOSIS — R928 Other abnormal and inconclusive findings on diagnostic imaging of breast: Secondary | ICD-10-CM

## 2016-10-30 ENCOUNTER — Ambulatory Visit
Admission: RE | Admit: 2016-10-30 | Discharge: 2016-10-30 | Disposition: A | Discharge status: Home / Self Care | Payer: BLUE CROSS/BLUE SHIELD | Source: Ambulatory Visit | Attending: Obstetrics and Gynecology | Admitting: Obstetrics and Gynecology

## 2016-10-30 DIAGNOSIS — R928 Other abnormal and inconclusive findings on diagnostic imaging of breast: Secondary | ICD-10-CM

## 2016-12-15 ENCOUNTER — Ambulatory Visit (INDEPENDENT_AMBULATORY_CARE_PROVIDER_SITE_OTHER): Payer: BLUE CROSS/BLUE SHIELD | Admitting: Internal Medicine

## 2016-12-15 ENCOUNTER — Telehealth: Payer: Self-pay | Admitting: Internal Medicine

## 2016-12-15 ENCOUNTER — Encounter: Payer: Self-pay | Admitting: Internal Medicine

## 2016-12-15 DIAGNOSIS — I10 Essential (primary) hypertension: Secondary | ICD-10-CM | POA: Diagnosis not present

## 2016-12-15 DIAGNOSIS — H578 Other specified disorders of eye and adnexa: Secondary | ICD-10-CM

## 2016-12-15 DIAGNOSIS — H5789 Other specified disorders of eye and adnexa: Secondary | ICD-10-CM

## 2016-12-15 MED ORDER — CARVEDILOL 12.5 MG PO TABS: 12.5000 mg | ORAL_TABLET | Freq: Two times a day (BID) | ORAL | 11 refills | Status: DC

## 2016-12-15 NOTE — Assessment & Plan Note (Addendum)
subconj hemorrhage L eye, medial Needs eye exam Treat HTN She is due eye exam

## 2016-12-15 NOTE — Patient Instructions (Signed)
??????????????????? ????????????? (  Subconjunctival Hemorrhage) ??????????????????? ????????????? - ??? ??????????? ? ??????? ????? ????? ?????? ????? (???????) ? ?????????? ?????????, ??????? ????????? ??????? ??????? ???? (????????????). ?????? ??????????? ????? ??????????? ????? ????????? ??????????? ???????. ??????????????????? ????????????? ??????????, ????? ???? ??? ????????? ???? ??????? ??????????? ? ??????????, ??????? ??????????? ???????? ????? ?? ?????. ??? ?????? ?? ?????. ? ??????????? ?? ?????????? ????????????, ??????? ????? ????? ??????????? ?????? ????????? ??????? ????? ??? ??? ??????? ????? ??????. ???? ??? ??????????? ?????????? ????? ?????, ? ???? ????? ????? ????? ?????????? ???????????. ??????????????????? ????????????? ?? ?????? ?? ?????? ? ?? ????????? ????, ?? ? ????? ????? ????????????? ???????????, ???? ????? ????? ????. ??????????????????? ????????????? ?????? ?? ??????? ???????; ??? ???????? ?????????????? ? ??????? ???? ??????. ??????? ????????? ????? ????????? ????? ????:  ?????? ??????, ????????, ??? ?????? ?????????? ????.  ??????? ?????? ??? ?????? ????? ?????????.  ??????, ??????? ??? ?????.  ?????????? ??????????, ????????, ??? ??????? ???????? ????????.  ?????????? ???????????? ????????.  ??????? ???????????? ????????????? ???????? ?? ?????.  ??????? ? ???????? ????????? ???????.  ????? ????????? ????????, ???????? ??? ?????????? ????? (???????????????).  ?????? ?????????, ????? ??? ??????? ????, ????????? ??????????? ????? ??? ???????? ???????. ??????????????????? ????????????? ????? ????????? ??? ??????? ???????. ???????? ???????? ????? ??????????? ???????? ?????????:  ????-??????? ??? ?????-??????? ????? ?? ????? ????? ?????.  ??????? ???????????, ?????? ??? ????????, ????? ???????????????? ?? ??????? ??????? ?????.  ??????? ???????????, ?????? ??? ????????, ????? ????? ???????????-??????? ?????.  ????.  ?????? ???????????  ?????. ??????? ??? ????????? ????????????? ??? ??????????? ????????????. ???? ??????????????????? ????????????? ???? ??????? ???????, ??? ???? ????? ????????? ??? ? ???????? (????????????) ??? ??????? ??????????? ??? ???????? ?? ?????? ??????. ??? ????? ????? ????????? ?????? ????????????, ? ??? ?????:  ??????? ????????????.  ????????? ????????????? ????????.  ??????? ????? ??? ???????? ??????????????. ???? ??????????????????? ????????????? ???? ??????? ???????, ??? ???????? ?? ?????? ?????? ????? ???? ????????? ?????????????? ??? ??-????????????. ??????? ??? ???? ????????? ???????, ??? ???????, ?? ?????????. ??? ?????????? ??????????? ??? ???? ????? ??????????????? ??????? ????? ??? ?????????? ???????? ??????????. ?????????? ?? ????? ? ???????? ????????  ?????????? ?????????????? ? ??????????? ????????? ?????? ??? ??????? ????? ??????.  ??? ?????????? ??????????? ?????????? ??????? ????? ??? ???????? ?????????, ??? ??????? ????? ??????.  ????????? ?????????? ?????????? ? ??????????? ????????, ??????? ????? ?????????? ??? ?????????? ???? ?????.  ????????? ? ?????? ????? ?? ??? ??????????? ??????. ??? ?????. ?????????? ? ?????, ????:  ?? ??????????? ???? ? ?????????? ?????.  ??????????? ?? ???????? ? ??????? 3 ??????.  ? ??? ?????????? ????? ??????????????????? ?????????????. ?????????? ?????????? ? ?????, ????:  ? ??? ?????????? ?????? ??? ????????? ????????? ??????.  ? ??? ???????? ????????? ?????????? ???????????????? ? ?????.  ? ??? ????????? ??????? ???????? ????, ??????? ?????, ??????????? ???????? ??? ???????????? ????????? (???????).  ??? ???? ??????? ???????? ??? ??????????? ?? ????????.  ? ??? ????????? ???????????? ?????? ?? ????.  ? ??? ????????? ???????????? ???????????? ? ?????? ??????? ????. ??? ?????????? ?? ????? ???????? ??????, ??????????????? ????? ??????. ??????????? ???????? ????? ???????????? ??? ??????? ? ????? ??????? ??????. Document Released:  09/07/2005 Document Revised: 05/29/2015 Document Reviewed: 11/14/2014 Elsevier Interactive Patient Education  2017 [employer].

## 2016-12-15 NOTE — Assessment & Plan Note (Signed)
Amlodipine Cont w/Coreg - dose increased

## 2016-12-15 NOTE — Progress Notes (Signed)
Pre-visit discussion using our clinic review tool. No additional management support is needed unless otherwise documented below in the visit note.  

## 2016-12-15 NOTE — Progress Notes (Signed)
Subjective:  Patient ID: Denise Donaldson, female    DOB: 1956-01-11  Age: 61 y.o. MRN: [id number]  CC: Eye Problem (left eye red sclera, woke up this morning, Headache, )   HPI Denise Donaldson presents for L eye redness since this am - no pain  Outpatient Medications Prior to Visit  Medication Sig Dispense Refill  . amLODipine (NORVASC) 5 MG tablet TAKE 1 TABLET BY MOUTH ONCE A DAY 30 tablet 0  . aspirin 81 MG tablet Take 81 mg by mouth as needed.    . carvedilol (COREG) 6.25 MG tablet TAKE 1 TABLET BY MOUTH TWICE A DAY WITH A MEAL 60 tablet 5  . Cholecalciferol 1000 UNITS tablet Take 1 tablet (1,000 Units total) by mouth daily. 100 tablet 3  . pantoprazole (PROTONIX) 40 MG tablet TAKE ONE TABLET BY MOUTH EVERY DAY 30 tablet 11  . ranitidine (ZANTAC) 300 MG tablet Take 1 tablet (300 mg total) by mouth at bedtime. 30 tablet 3  . triamcinolone (KENALOG) 0.025 % ointment Apply topically 2 (two) times daily. 30 g 2   No facility-administered medications prior to visit.     ROS Review of Systems  Constitutional: Negative for activity change, appetite change, chills, fatigue and unexpected weight change.  HENT: Negative for congestion, mouth sores and sinus pressure.   Eyes: Negative for visual disturbance.  Respiratory: Negative for cough and chest tightness.   Gastrointestinal: Negative for abdominal pain and nausea.  Genitourinary: Negative for difficulty urinating, frequency and vaginal pain.  Musculoskeletal: Negative for back pain and gait problem.  Skin: Negative for pallor and rash.  Neurological: Negative for dizziness, tremors, weakness, numbness and headaches.  Psychiatric/Behavioral: Negative for confusion and sleep disturbance.    Objective:  BP (!) 188/58   Pulse 76   Temp 98.1 F (36.7 C) (Oral)   Resp 16   Ht 5\' 2"  (1.575 m)   Wt 186 lb 8 oz (84.6 kg)   SpO2 98%   BMI 34.11 kg/m   BP Readings from Last 3 Encounters:  12/15/16 (!) 188/58  09/08/16  136/84  06/01/16 140/82    Wt Readings from Last 3 Encounters:  12/15/16 186 lb 8 oz (84.6 kg)  09/08/16 183 lb 2 oz (83.1 kg)  06/01/16 186 lb (84.4 kg)    Physical Exam  Constitutional: She appears well-developed. No distress.  HENT:  Head: Normocephalic.  Right Ear: External ear normal.  Left Ear: External ear normal.  Nose: Nose normal.  Mouth/Throat: Oropharynx is clear and moist.  Eyes: Conjunctivae are normal. Pupils are equal, round, and reactive to light. Right eye exhibits no discharge. Left eye exhibits no discharge.  Neck: Normal range of motion. Neck supple. No JVD present. No tracheal deviation present. No thyromegaly present.  Cardiovascular: Normal rate, regular rhythm and normal heart sounds.   Pulmonary/Chest: No stridor. No respiratory distress. She has no wheezes.  Abdominal: Soft. Bowel sounds are normal. She exhibits no distension and no mass. There is no tenderness. There is no rebound and no guarding.  Musculoskeletal: She exhibits no edema or tenderness.  Lymphadenopathy:    She has no cervical adenopathy.  Neurological: She displays normal reflexes. No cranial nerve deficit. She exhibits normal muscle tone. Coordination normal.  Skin: No rash noted. No erythema.  Psychiatric: She has a normal mood and affect. Her behavior is normal. Judgment and thought content normal.  L eye medial subconj hemorrhage  FTF>20 min discussing HTN  Lab Results  Component Value Date  WBC 8.6 06/01/2016   HGB 15.2 (H) 06/01/2016   HCT 44.8 06/01/2016   PLT 418.0 (H) 06/01/2016   GLUCOSE 99 06/01/2016   ALT 32 06/01/2016   AST 24 06/01/2016   NA 138 06/01/2016   K 4.0 06/01/2016   CL 105 06/01/2016   CREATININE 0.66 06/01/2016   BUN 11 06/01/2016   CO2 25 06/01/2016   TSH 1.00 06/01/2016    Mm Diag Breast Tomo Uni Right  Result Date: 10/30/2016 CLINICAL DATA:  Screening recall for asymmetry seen in the right breast on the MLO view only. EXAM: 2D DIGITAL  DIAGNOSTIC UNILATERAL RIGHT MAMMOGRAM WITH CAD AND ADJUNCT TOMO COMPARISON:  Previous exam(s). ACR Breast Density Category b: There are scattered areas of fibroglandular density. FINDINGS: Cc and MLO tomograms were performed of the right breast. The initially questioned possible right breast asymmetry resolves on the additional imaging with findings compatible with overlapping fibroglandular tissue. There is no mammographic evidence of malignancy in the right breast. Mammographic images were processed with CAD. IMPRESSION: No mammographic evidence of malignancy in the right breast. RECOMMENDATION: Screening mammogram in one year.(Code:SM-B-01Y) I have discussed the findings and recommendations with the patient. Results were also provided in writing at the conclusion of the visit. If applicable, a reminder letter will be sent to the patient regarding the next appointment. BI-RADS CATEGORY  1: Negative. Electronically Signed   By: Everlean Alstrom M.D.   On: 10/30/2016 12:16    Assessment & Plan:   There are no diagnoses linked to this encounter. I am having Ms. Rieger maintain her Cholecalciferol, aspirin, amLODipine, pantoprazole, triamcinolone, ranitidine, and carvedilol.  No orders of the defined types were placed in this encounter.    Follow-up: No Follow-up on file.  Walker Kehr, MD

## 2016-12-22 ENCOUNTER — Ambulatory Visit: Payer: BLUE CROSS/BLUE SHIELD | Admitting: Internal Medicine

## 2016-12-24 DIAGNOSIS — Z01419 Encounter for gynecological examination (general) (routine) without abnormal findings: Secondary | ICD-10-CM | POA: Diagnosis not present

## 2016-12-24 DIAGNOSIS — Z6833 Body mass index (BMI) 33.0-33.9, adult: Secondary | ICD-10-CM | POA: Diagnosis not present

## 2016-12-31 ENCOUNTER — Other Ambulatory Visit: Payer: Self-pay | Admitting: Gastroenterology

## 2017-03-15 ENCOUNTER — Encounter: Payer: Self-pay | Admitting: Gastroenterology

## 2017-04-03 ENCOUNTER — Other Ambulatory Visit: Payer: Self-pay | Admitting: Internal Medicine

## 2017-04-12 ENCOUNTER — Encounter: Payer: Self-pay | Admitting: Gastroenterology

## 2017-04-19 ENCOUNTER — Other Ambulatory Visit: Payer: Self-pay | Admitting: Internal Medicine

## 2017-04-22 ENCOUNTER — Ambulatory Visit (AMBULATORY_SURGERY_CENTER): Payer: Self-pay

## 2017-04-22 VITALS — Ht 62.0 in | Wt 188.2 lb

## 2017-04-22 DIAGNOSIS — K219 Gastro-esophageal reflux disease without esophagitis: Secondary | ICD-10-CM

## 2017-04-22 NOTE — Progress Notes (Signed)
Per pt, no allergies to soy or egg products.Pt not taking any weight loss meds or using  O2 at home.   Pt refused Emmi video. 

## 2017-05-06 ENCOUNTER — Encounter: Payer: Self-pay | Admitting: Gastroenterology

## 2017-05-11 ENCOUNTER — Encounter: Payer: Self-pay | Admitting: Gastroenterology

## 2017-05-11 ENCOUNTER — Ambulatory Visit (AMBULATORY_SURGERY_CENTER): Payer: BLUE CROSS/BLUE SHIELD | Admitting: Gastroenterology

## 2017-05-11 VITALS — BP 130/65 | HR 68 | Temp 97.8°F | Resp 15 | Ht 62.0 in | Wt 188.0 lb

## 2017-05-11 DIAGNOSIS — K219 Gastro-esophageal reflux disease without esophagitis: Secondary | ICD-10-CM

## 2017-05-11 DIAGNOSIS — K209 Esophagitis, unspecified: Secondary | ICD-10-CM | POA: Diagnosis not present

## 2017-05-11 MED ORDER — SODIUM CHLORIDE 0.9 % IV SOLN: 500.0000 mL | INTRAVENOUS | Status: DC

## 2017-05-11 NOTE — Progress Notes (Signed)
Spontaneous respirations throughout. VSS. Resting comfortably. To PACU on room air. Report to  RN. 

## 2017-05-11 NOTE — Patient Instructions (Signed)
Impression/Recommendations:  Resume previous diet. Continue present medications. No Aspirin, Ibuprofen, Naproxen, or other NSAID drugs.  Return to GI office in one year.  YOU HAD AN ENDOSCOPIC PROCEDURE TODAY AT [facility]:   Refer to the procedure report that was given to you for any specific questions about what was found during the examination.  If the procedure report does not answer your questions, please call your gastroenterologist to clarify.  If you requested that your care partner not be given the details of your procedure findings, then the procedure report has been included in a sealed envelope for you to review at your convenience later.  YOU SHOULD EXPECT: Some feelings of bloating in the abdomen. Passage of more gas than usual.  Walking can help get rid of the air that was put into your GI tract during the procedure and reduce the bloating. If you had a lower endoscopy (such as a colonoscopy or flexible sigmoidoscopy) you may notice spotting of blood in your stool or on the toilet paper. If you underwent a bowel prep for your procedure, you may not have a normal bowel movement for a few days.  Please Note:  You might notice some irritation and congestion in your nose or some drainage.  This is from the oxygen used during your procedure.  There is no need for concern and it should clear up in a day or so.  SYMPTOMS TO REPORT IMMEDIATELY:   Following upper endoscopy (EGD)  Vomiting of blood or coffee ground material  New chest pain or pain under the shoulder blades  Painful or persistently difficult swallowing  New shortness of breath  Fever of 100F or higher  Black, tarry-looking stools  For urgent or emergent issues, a gastroenterologist can be reached at any hour by calling [phone].   DIET:  We do recommend a small meal at first, but then you may proceed to your regular diet.  Drink plenty of fluids but you should avoid alcoholic beverages for 24  hours.  ACTIVITY:  You should plan to take it easy for the rest of today and you should NOT DRIVE or use heavy machinery until tomorrow (because of the sedation medicines used during the test).    FOLLOW UP: Our staff will call the number listed on your records the next business day following your procedure to check on you and address any questions or concerns that you may have regarding the information given to you following your procedure. If we do not reach you, we will leave a message.  However, if you are feeling well and you are not experiencing any problems, there is no need to return our call.  We will assume that you have returned to your regular daily activities without incident.  If any biopsies were taken you will be contacted by phone or by letter within the next 1-3 weeks.  Please call us at [phone] if you have not heard about the biopsies in 3 weeks.    SIGNATURES/CONFIDENTIALITY: You and/or your care partner have signed paperwork which will be entered into your electronic medical record.  These signatures attest to the fact that that the information above on your After Visit Summary has been reviewed and is understood.  Full responsibility of the confidentiality of this discharge information lies with you and/or your care-partner.

## 2017-05-11 NOTE — Op Note (Signed)
[facility] Patient Name: Denise Donaldson Procedure Date: 05/11/2017 10:24 AM MRN: [id number] Endoscopist: Mauri Pole , MD Age: 61 Referring MD:  Date of Birth: [birth year] Gender: Female Account #: [id number] Procedure:                Upper GI endoscopy Indications:              Surveillance for malignancy due to personal history                            of Barrett's esophagus, Esophageal reflux Medicines:                Monitored Anesthesia Care Procedure:                Pre-Anesthesia Assessment:                           - Prior to the procedure, a History and Physical                            was performed, and patient medications and                            allergies were reviewed. The patient's tolerance of                            previous anesthesia was also reviewed. The risks                            and benefits of the procedure and the sedation                            options and risks were discussed with the patient.                            All questions were answered, and informed consent                            was obtained. Prior Anticoagulants: The patient has                            taken no previous anticoagulant or antiplatelet                            agents. ASA Grade Assessment: II - A patient with                            mild systemic disease. After reviewing the risks                            and benefits, the patient was deemed in                            satisfactory condition to undergo the procedure.  After obtaining informed consent, the endoscope was                            passed under direct vision. Throughout the                            procedure, the patient's blood pressure, pulse, and                            oxygen saturations were monitored continuously. The                            Endoscope was introduced through the mouth, and   advanced to the second part of duodenum. The upper                            GI endoscopy was accomplished without difficulty.                            The patient tolerated the procedure well. Scope In: Scope Out: Findings:                 The esophagus was normal. Small sliding hiatal                            hernia                           The stomach was normal.                           The examined duodenum was normal. Complications:            No immediate complications. Estimated Blood Loss:     Estimated blood loss: none. Impression:               - Normal esophagus.                           - Normal stomach.                           - Normal examined duodenum.                           - No specimens collected. Recommendation:           - Patient has a contact number available for                            emergencies. The signs and symptoms of potential                            delayed complications were discussed with the                            patient. Return to normal activities tomorrow.  Written discharge instructions were provided to the                            patient.                           - Resume previous diet.                           - Continue present medications.                           - No aspirin, ibuprofen, naproxen, or other                            non-steroidal anti-inflammatory drugs.                           - No repeat upper endoscopy.                           - Return to GI office in 1 year. Mauri Pole, MD 05/11/2017 10:41:44 AM This report has been signed electronically.

## 2017-05-11 NOTE — Progress Notes (Signed)
Pt's states no medical or surgical changes since previsit or office visit. 

## 2017-05-12 ENCOUNTER — Telehealth: Payer: Self-pay

## 2017-05-12 ENCOUNTER — Telehealth: Payer: Self-pay | Admitting: *Deleted

## 2017-05-12 NOTE — Telephone Encounter (Signed)
No answer, left message to call if questions or concerns. 

## 2017-05-12 NOTE — Telephone Encounter (Signed)
  Follow up Call-  Call back number 05/11/2017  Post procedure Call Back phone  # [phone]  Permission to leave phone message Yes  Some recent data might be hidden     Patient questions:  Do you have a fever, pain , or abdominal swelling? No. Pain Score  0 *  Have you tolerated food without any problems? Yes.    Have you been able to return to your normal activities? Yes.    Do you have any questions about your discharge instructions: Diet   No. Medications  No. Follow up visit  No.  Do you have questions or concerns about your Care? No.  Actions: * If pain score is 4 or above: No action needed, pain <4.

## 2017-05-25 ENCOUNTER — Telehealth: Payer: Self-pay | Admitting: Gastroenterology

## 2017-05-25 NOTE — Telephone Encounter (Signed)
Left message of returned call 

## 2017-05-26 NOTE — Telephone Encounter (Signed)
Discussed the EGD findings or impression. Copy of the report to her. Mailing address confirmed.

## 2017-06-14 ENCOUNTER — Other Ambulatory Visit: Payer: Self-pay | Admitting: Internal Medicine

## 2017-07-10 ENCOUNTER — Other Ambulatory Visit: Payer: Self-pay | Admitting: Internal Medicine

## 2017-08-03 ENCOUNTER — Other Ambulatory Visit: Payer: Self-pay | Admitting: Internal Medicine

## 2017-08-08 ENCOUNTER — Other Ambulatory Visit: Payer: Self-pay | Admitting: Internal Medicine

## 2017-08-25 ENCOUNTER — Other Ambulatory Visit: Payer: Self-pay | Admitting: Internal Medicine

## 2017-08-25 NOTE — Telephone Encounter (Signed)
ERROR

## 2017-08-26 ENCOUNTER — Encounter: Payer: Self-pay | Admitting: Internal Medicine

## 2017-08-26 ENCOUNTER — Ambulatory Visit (INDEPENDENT_AMBULATORY_CARE_PROVIDER_SITE_OTHER): Payer: BLUE CROSS/BLUE SHIELD | Admitting: Internal Medicine

## 2017-08-26 DIAGNOSIS — I10 Essential (primary) hypertension: Secondary | ICD-10-CM | POA: Diagnosis not present

## 2017-08-26 DIAGNOSIS — R1013 Epigastric pain: Secondary | ICD-10-CM | POA: Diagnosis not present

## 2017-08-26 DIAGNOSIS — K2100 Gastro-esophageal reflux disease with esophagitis, without bleeding: Secondary | ICD-10-CM

## 2017-08-26 DIAGNOSIS — K2270 Barrett's esophagus without dysplasia: Secondary | ICD-10-CM | POA: Diagnosis not present

## 2017-08-26 MED ORDER — AMLODIPINE BESYLATE 2.5 MG PO TABS: 2.5000 mg | ORAL_TABLET | Freq: Two times a day (BID) | ORAL | 3 refills | Status: DC

## 2017-08-26 NOTE — Patient Instructions (Signed)
  Gluten free trial for 4-6 weeks. OK to use gluten-free bread and gluten-free pasta.    Gluten-Free Diet for Celiac Disease, Adult The gluten-free diet includes all foods that do not contain gluten. Gluten is a protein that is found in wheat, rye, barley, and some other grains. Following the gluten-free diet is the only treatment for people with celiac disease. It helps to prevent damage to the intestines and improves or eliminates the symptoms of celiac disease. Following the gluten-free diet requires some planning. It can be challenging at first, but it gets easier with time and practice. There are more gluten-free options available today than ever before. If you need help finding gluten-free foods or if you have questions, talk with your diet and nutrition specialist (registered dietitian) or your health care provider. What do I need to know about a gluten-free diet?  All fruits, vegetables, and meats are safe to eat and do not contain gluten.  When grocery shopping, start by shopping in the produce, meat, and dairy sections. These sections are more likely to contain gluten-free foods. Then move to the aisles that contain packaged foods if you need to.  Read all food labels. Gluten is often added to foods. Always check the ingredient list and look for warnings, such as "may contain gluten."  Talk with your dietitian or health care provider before taking a gluten-free multivitamin or mineral supplement.  Be aware of gluten-free foods having contact with foods that contain gluten (cross-contamination). This can happen at home and with any processed foods. ? Talk with your health care provider or dietitian about how to reduce the risk of cross-contamination in your home. ? If you have questions about how a food is processed, ask the manufacturer. What key words help to identify gluten? Foods that list any of these key words on the label usually contain gluten:  Wheat, flour, enriched  flour, bromated flour, white flour, durum flour, graham flour, phosphated flour, self-rising flour, semolina, farina, barley (malt), rye, and oats.  Starch, dextrin, modified food starch, or cereal.  Thickening, fillers, or emulsifiers.  Malt flavoring, malt extract, or malt syrup.  Hydrolyzed vegetable protein.  In the [place] packaged foods that are gluten-free are required to be labeled "GF." These foods should be easy to identify and are safe to eat. In the [place] food companies are also required to list common food allergens, including wheat, on their labels. Recommended foods Grains  Amaranth, bean flours, 100% buckwheat flour, corn, millet, nut flours or nut meals, GF oats, quinoa, rice, sorghum, teff, rice wafers, pure cornmeal tortillas, popcorn, and hot cereals made from cornmeal. Hominy, rice, wild rice. Some Asian rice noodles or bean noodles. Arrowroot starch, corn bran, corn flour, corn germ, cornmeal, corn starch, potato flour, potato starch flour, and rice bran. Plain, brown, and sweet rice flours. Rice polish, soy flour, and tapioca starch. Vegetables  All plain fresh, frozen, and canned vegetables. Fruits  All plain fresh, frozen, canned, and dried fruits, and 100% fruit juices. Meats and other protein foods  All fresh beef, pork, poultry, fish, seafood, and eggs. Fish canned in water, oil, brine, or vegetable broth. Plain nuts and seeds, peanut butter. Some lunch meat and some frankfurters. Dried beans, dried peas, and lentils. Dairy  Fresh plain, dry, evaporated, or condensed milk. Cream, butter, sour cream, whipping cream, and most yogurts. Unprocessed cheese, most processed cheeses, some cottage cheese, some cream cheeses. Beverages  Coffee, tea, most herbal teas. Carbonated beverages and some root beers.   Wine, sake, and distilled spirits, such as gin, vodka, and whiskey. Most hard ciders. Fats and oils  Butter, margarine, vegetable oil, hydrogenated butter, olive  oil, shortening, lard, cream, and some mayonnaise. Some commercial salad dressings. Olives. Sweets and desserts  Sugar, honey, some syrups, molasses, jelly, and jam. Plain hard candy, marshmallows, and gumdrops. Pure cocoa powder. Plain chocolate. Custard and some pudding mixes. Gelatin desserts, sorbets, frozen ice pops, and sherbet. Cake, cookies, and other desserts prepared with allowed flours. Some commercial ice creams. Cornstarch, tapioca, and rice puddings. Seasoning and other foods  Some canned or frozen soups. Monosodium glutamate (MSG). Cider, rice, and wine vinegar. Baking soda and baking powder. Cream of tartar. Baking and nutritional yeast. Certain soy sauces made without wheat (ask your dietitian about specific brands that are allowed). Nuts, coconut, and chocolate. Salt, pepper, herbs, spices, flavoring extracts, imitation or artificial flavorings, natural flavorings, and food colorings. Some medicines and supplements. Some lip glosses and other cosmetics. Rice syrups. The items listed may not be a complete list. Talk with your dietitian about what dietary choices are best for you. Foods to avoid Grains  Barley, bran, bulgur, couscous, cracked wheat, , farro, graham, malt, matzo, semolina, wheat germ, and all wheat and rye cereals including spelt and kamut. Cereals containing malt as a flavoring, such as rice cereal. Noodles, spaghetti, macaroni, most packaged rice mixes, and all mixes containing wheat, rye, barley, or triticale. Vegetables  Most creamed vegetables and most vegetables canned in sauces. Some commercially prepared vegetables and salads. Fruits  Thickened or prepared fruits and some pie fillings. Some fruit snacks and fruit roll-ups. Meats and other protein foods  Any meat or meat alternative containing wheat, rye, barley, or gluten stabilizers. These are often marinated or packaged meats and lunch meats. Bread-containing products, such as Swiss steak,  croquettes, meatballs, and meatloaf. Most tuna canned in vegetable broth and turkey with hydrolyzed vegetable protein (HVP) injected as part of the basting. Seitan. Imitation fish. Eggs in sauces made from ingredients to avoid. Dairy  Commercial chocolate milk drinks and malted milk. Some non-dairy creamers. Any cheese product containing ingredients to avoid. Beverages  Certain cereal beverages. Beer, ale, malted milk, and some root beers. Some hard ciders. Some instant flavored coffees. Some herbal teas made with barley or with barley malt added. Fats and oils  Some commercial salad dressings. Sour cream containing modified food starch. Sweets and desserts  Some toffees. Chocolate-coated nuts (may be rolled in wheat flour) and some commercial candies and candy bars. Most cakes, cookies, donuts, pastries, and other baked goods. Some commercial ice cream. Ice cream cones. Commercially prepared mixes for cakes, cookies, and other desserts. Bread pudding and other puddings thickened with flour. Products containing brown rice syrup made with barley malt enzyme. Desserts and sweets made with malt flavoring. Seasoning and other foods  Some curry powders, some dry seasoning mixes, some gravy extracts, some meat sauces, some ketchups, some prepared mustards, and horseradish. Certain soy sauces. Malt vinegar. Bouillon and bouillon cubes that contain HVP. Some chip dips, and some chewing gum. Yeast extract. Brewer's yeast. Caramel color. Some medicines and supplements. Some lip glosses and other cosmetics. The items listed may not be a complete list. Talk with your dietitian about what dietary choices are best for you. Summary  Gluten is a protein that is found in wheat, rye, barley, and some other grains. The gluten-free diet includes all foods that do not contain gluten.  If you need help finding gluten-free foods or if   you have questions, talk with your diet and nutrition specialist (registered  dietitian) or your health care provider.  Read all food labels. Gluten is often added to foods. Always check the ingredient list and look for warnings, such as "may contain gluten." This information is not intended to replace advice given to you by your health care provider. Make sure you discuss any questions you have with your health care provider. Document Released: 09/07/2005 Document Revised: 06/22/2016 Document Reviewed: 06/22/2016 Elsevier Interactive Patient Education  2018 [employer]   

## 2017-08-26 NOTE — Progress Notes (Signed)
Subjective:  Patient ID: Denise Donaldson Current, female    DOB: Apr 04, 1956  Age: 61 y.o. MRN: [id number]  CC: No chief complaint on file.   HPI [name] presents for GERD, HTN f/u, h/o H pylori - treated C/o epig pain  Outpatient Medications Prior to Visit  Medication Sig Dispense Refill  . amLODipine (NORVASC) 5 MG tablet TAKE 1 TABLET (5 MG TOTAL) BY MOUTH DAILY. 30 tablet 11  . aspirin 81 MG tablet Take 81 mg by mouth as needed.    . carvedilol (COREG) 12.5 MG tablet Take 1 tablet (12.5 mg total) by mouth 2 (two) times daily with a meal. 60 tablet 11  . Cholecalciferol 1000 UNITS tablet Take 1 tablet (1,000 Units total) by mouth daily. 100 tablet 3  . pantoprazole (PROTONIX) 40 MG tablet Take 1 tablet (40 mg total) by mouth daily. Patient needs office visit before refills will be given 30 tablet 0  . ranitidine (ZANTAC) 300 MG tablet TAKE 1 TABLET (300 MG TOTAL) BY MOUTH AT BEDTIME. (Patient taking differently: Take 300 mg by mouth as needed. ) 30 tablet 3  . triamcinolone (KENALOG) 0.025 % ointment Apply topically 2 (two) times daily. 30 g 2   Facility-Administered Medications Prior to Visit  Medication Dose Route Frequency Provider Last Rate Last Dose  . 0.9 %  sodium chloride infusion  500 mL Intravenous Continuous Nandigam, Kavitha V, MD        ROS Review of Systems  Constitutional: Negative for activity change, appetite change, chills, fatigue and unexpected weight change.  HENT: Negative for congestion, mouth sores and sinus pressure.   Eyes: Negative for visual disturbance.  Respiratory: Negative for cough and chest tightness.   Gastrointestinal: Positive for abdominal pain. Negative for nausea.  Genitourinary: Negative for difficulty urinating, frequency and vaginal pain.  Musculoskeletal: Negative for back pain and gait problem.  Skin: Negative for pallor and rash.  Neurological: Negative for dizziness, tremors, weakness, numbness and headaches.    Psychiatric/Behavioral: Negative for confusion, sleep disturbance and suicidal ideas.    Objective:  BP 114/70 (BP Location: Left Arm, Patient Position: Sitting, Cuff Size: Large)   Pulse 68   Temp 97.9 F (36.6 C) (Oral)   Ht 5\' 2"  (1.575 m)   Wt 187 lb (84.8 kg)   SpO2 98%   BMI 34.20 kg/m   BP Readings from Last 3 Encounters:  08/26/17 114/70  05/11/17 130/65  12/15/16 (!) 150/80    Wt Readings from Last 3 Encounters:  08/26/17 187 lb (84.8 kg)  05/11/17 188 lb (85.3 kg)  04/22/17 188 lb 3.2 oz (85.4 kg)    Physical Exam  Constitutional: She appears well-developed. No distress.  HENT:  Head: Normocephalic.  Right Ear: External ear normal.  Left Ear: External ear normal.  Nose: Nose normal.  Mouth/Throat: Oropharynx is clear and moist.  Eyes: Conjunctivae are normal. Pupils are equal, round, and reactive to light. Right eye exhibits no discharge. Left eye exhibits no discharge.  Neck: Normal range of motion. Neck supple. No JVD present. No tracheal deviation present. No thyromegaly present.  Cardiovascular: Normal rate, regular rhythm and normal heart sounds.  Pulmonary/Chest: No stridor. No respiratory distress. She has no wheezes.  Abdominal: Soft. Bowel sounds are normal. She exhibits no distension and no mass. There is no tenderness. There is no rebound and no guarding.  Musculoskeletal: She exhibits no edema or tenderness.  Lymphadenopathy:    She has no cervical adenopathy.  Neurological: She displays normal reflexes. No  cranial nerve deficit. She exhibits normal muscle tone. Coordination normal.  Skin: No rash noted. No erythema.  Psychiatric: She has a normal mood and affect. Her behavior is normal. Judgment and thought content normal.   epig area is sensitive   Lab Results  Component Value Date   WBC 8.6 06/01/2016   HGB 15.2 (H) 06/01/2016   HCT 44.8 06/01/2016   PLT 418.0 (H) 06/01/2016   GLUCOSE 99 06/01/2016   ALT 32 06/01/2016   AST 24  06/01/2016   NA 138 06/01/2016   K 4.0 06/01/2016   CL 105 06/01/2016   CREATININE 0.66 06/01/2016   BUN 11 06/01/2016   CO2 25 06/01/2016   TSH 1.00 06/01/2016    Mm Diag Breast Tomo Uni Right  Result Date: 10/30/2016 CLINICAL DATA:  Screening recall for asymmetry seen in the right breast on the MLO view only. EXAM: 2D DIGITAL DIAGNOSTIC UNILATERAL RIGHT MAMMOGRAM WITH CAD AND ADJUNCT TOMO COMPARISON:  Previous exam(s). ACR Breast Density Category b: There are scattered areas of fibroglandular density. FINDINGS: Cc and MLO tomograms were performed of the right breast. The initially questioned possible right breast asymmetry resolves on the additional imaging with findings compatible with overlapping fibroglandular tissue. There is no mammographic evidence of malignancy in the right breast. Mammographic images were processed with CAD. IMPRESSION: No mammographic evidence of malignancy in the right breast. RECOMMENDATION: Screening mammogram in one year.(Code:SM-B-01Y) I have discussed the findings and recommendations with the patient. Results were also provided in writing at the conclusion of the visit. If applicable, a reminder letter will be sent to the patient regarding the next appointment. BI-RADS CATEGORY  1: Negative. Electronically Signed   By: Everlean Alstrom M.D.   On: 10/30/2016 12:16    Assessment & Plan:   There are no diagnoses linked to this encounter. I am having Rekia Fraleigh maintain her Cholecalciferol, aspirin, triamcinolone, carvedilol, ranitidine, pantoprazole, and amLODipine. We will continue to administer sodium chloride.  No orders of the defined types were placed in this encounter.    Follow-up: No Follow-up on file.  Walker Kehr, MD

## 2017-08-30 ENCOUNTER — Encounter: Payer: Self-pay | Admitting: Internal Medicine

## 2017-08-30 DIAGNOSIS — R109 Unspecified abdominal pain: Secondary | ICD-10-CM | POA: Insufficient documentation

## 2017-08-30 NOTE — Assessment & Plan Note (Signed)
Try gluten free diet 

## 2017-08-30 NOTE — Assessment & Plan Note (Signed)
Protonix, Zantac

## 2017-08-30 NOTE — Assessment & Plan Note (Addendum)
Protonix  Zantac prn

## 2017-08-30 NOTE — Assessment & Plan Note (Signed)
Amlodipine, Coreg 

## 2017-10-01 ENCOUNTER — Telehealth: Payer: Self-pay | Admitting: Internal Medicine

## 2017-10-01 MED ORDER — AMLODIPINE BESYLATE 2.5 MG PO TABS: 2.5000 mg | ORAL_TABLET | Freq: Two times a day (BID) | ORAL | 3 refills | Status: DC

## 2017-10-01 NOTE — Telephone Encounter (Signed)
Needs to try a brand Norvasc - generic dissolves too quick and gives indigestion OK

## 2017-10-19 ENCOUNTER — Telehealth: Payer: Self-pay

## 2017-10-19 NOTE — Telephone Encounter (Signed)
PA redone Key: [id number]

## 2017-10-21 NOTE — Telephone Encounter (Signed)
Appeal approved

## 2017-11-08 DIAGNOSIS — Z1231 Encounter for screening mammogram for malignant neoplasm of breast: Secondary | ICD-10-CM | POA: Diagnosis not present

## 2017-11-24 ENCOUNTER — Ambulatory Visit (INDEPENDENT_AMBULATORY_CARE_PROVIDER_SITE_OTHER): Payer: BLUE CROSS/BLUE SHIELD | Admitting: Internal Medicine

## 2017-11-24 ENCOUNTER — Other Ambulatory Visit (INDEPENDENT_AMBULATORY_CARE_PROVIDER_SITE_OTHER): Payer: BLUE CROSS/BLUE SHIELD

## 2017-11-24 ENCOUNTER — Encounter: Payer: Self-pay | Admitting: Internal Medicine

## 2017-11-24 VITALS — BP 145/90 | HR 65 | Temp 97.6°F | Ht 62.0 in | Wt 186.0 lb

## 2017-11-24 DIAGNOSIS — I10 Essential (primary) hypertension: Secondary | ICD-10-CM

## 2017-11-24 DIAGNOSIS — R1013 Epigastric pain: Secondary | ICD-10-CM

## 2017-11-24 DIAGNOSIS — K2100 Gastro-esophageal reflux disease with esophagitis, without bleeding: Secondary | ICD-10-CM

## 2017-11-24 DIAGNOSIS — R432 Parageusia: Secondary | ICD-10-CM

## 2017-11-24 LAB — LIPID PANEL
CHOLESTEROL: 207 mg/dL — AB (ref 0–200)
HDL: 69.3 mg/dL (ref >39.00)
LDL Cholesterol: 113 mg/dL — ABNORMAL HIGH (ref 0–99)
NonHDL: 137.8
TRIGLYCERIDES: 125 mg/dL (ref 0.0–149.0)
Total CHOL/HDL Ratio: 3
VLDL: 25 mg/dL (ref 0.0–40.0)

## 2017-11-24 LAB — CBC WITH DIFFERENTIAL/PLATELET
Basophils Absolute: 0.1 10*3/uL (ref 0.0–0.1)
Basophils Relative: 0.9 % (ref 0.0–3.0)
EOS PCT: 2 % (ref 0.0–5.0)
Eosinophils Absolute: 0.2 10*3/uL (ref 0.0–0.7)
HEMATOCRIT: 44.6 % (ref 36.0–46.0)
Hemoglobin: 15 g/dL (ref 12.0–15.0)
LYMPHS ABS: 3 10*3/uL (ref 0.7–4.0)
LYMPHS PCT: 35.3 % (ref 12.0–46.0)
MCHC: 33.5 g/dL (ref 30.0–36.0)
MCV: 91.1 fl (ref 78.0–100.0)
MONOS PCT: 8.5 % (ref 3.0–12.0)
Monocytes Absolute: 0.7 10*3/uL (ref 0.1–1.0)
NEUTROS ABS: 4.5 10*3/uL (ref 1.4–7.7)
Neutrophils Relative %: 53.3 % (ref 43.0–77.0)
PLATELETS: 426 10*3/uL — AB (ref 150.0–400.0)
RBC: 4.9 Mil/uL (ref 3.87–5.11)
RDW: 13.2 % (ref 11.5–15.5)
WBC: 8.5 10*3/uL (ref 4.0–10.5)

## 2017-11-24 LAB — BASIC METABOLIC PANEL
BUN: 13 mg/dL (ref 6–23)
CO2: 27 mEq/L (ref 19–32)
CREATININE: 0.69 mg/dL (ref 0.40–1.20)
Calcium: 9.9 mg/dL (ref 8.4–10.5)
Chloride: 104 mEq/L (ref 96–112)
GFR: 91.06 mL/min (ref >60.00)
Glucose, Bld: 88 mg/dL (ref 70–99)
Potassium: 4.1 mEq/L (ref 3.5–5.1)
SODIUM: 138 meq/L (ref 135–145)

## 2017-11-24 LAB — URINALYSIS, ROUTINE W REFLEX MICROSCOPIC
Bilirubin Urine: NEGATIVE
HGB URINE DIPSTICK: NEGATIVE
Nitrite: NEGATIVE
Specific Gravity, Urine: >=1.03 — AB (ref 1.000–1.030)
Total Protein, Urine: NEGATIVE
URINE GLUCOSE: NEGATIVE
Urobilinogen, UA: 0.2 (ref 0.0–1.0)
pH: 5.5 (ref 5.0–8.0)

## 2017-11-24 LAB — HEPATIC FUNCTION PANEL
ALBUMIN: 4.1 g/dL (ref 3.5–5.2)
ALT: 23 U/L (ref 0–35)
AST: 18 U/L (ref 0–37)
Alkaline Phosphatase: 59 U/L (ref 39–117)
Bilirubin, Direct: 0 mg/dL (ref 0.0–0.3)
TOTAL PROTEIN: 7.6 g/dL (ref 6.0–8.3)
Total Bilirubin: 0.3 mg/dL (ref 0.2–1.2)

## 2017-11-24 LAB — VITAMIN D 25 HYDROXY (VIT D DEFICIENCY, FRACTURES): VITD: 23.66 ng/mL — ABNORMAL LOW (ref 30.00–100.00)

## 2017-11-24 LAB — VITAMIN B12: VITAMIN B 12: 342 pg/mL (ref 211–911)

## 2017-11-24 LAB — TSH: TSH: 0.96 u[IU]/mL (ref 0.35–4.50)

## 2017-11-24 MED ORDER — CARVEDILOL 12.5 MG PO TABS: 12.5000 mg | ORAL_TABLET | Freq: Two times a day (BID) | ORAL | 11 refills | Status: DC

## 2017-11-24 NOTE — Progress Notes (Signed)
Subjective:  Patient ID: Denise Donaldson, female    DOB: Mar 24, 1956  Age: 62 y.o. MRN: [id number]  CC: No chief complaint on file.   HPI Denise Donaldson presents for HTN, GERD Stopped Amlodipine, Coreg - generic dissolves in mouth - upset stomach BP up and down at home Pt became a vegetarian - taste changed  Outpatient Medications Prior to Visit  Medication Sig Dispense Refill  . amLODipine (NORVASC) 2.5 MG tablet Take 1 tablet (2.5 mg total) by mouth 2 (two) times daily. 180 tablet 3  . aspirin 81 MG tablet Take 81 mg by mouth as needed.    . carvedilol (COREG) 12.5 MG tablet Take 1 tablet (12.5 mg total) by mouth 2 (two) times daily with a meal. 60 tablet 11  . Cholecalciferol 1000 UNITS tablet Take 1 tablet (1,000 Units total) by mouth daily. 100 tablet 3  . pantoprazole (PROTONIX) 40 MG tablet TAKE 1 TABLET BY MOUTH EVERY DAY 30 tablet 2  . triamcinolone (KENALOG) 0.025 % ointment Apply topically 2 (two) times daily. 30 g 2   Facility-Administered Medications Prior to Visit  Medication Dose Route Frequency Provider Last Rate Last Dose  . 0.9 %  sodium chloride infusion  500 mL Intravenous Continuous Nandigam, Kavitha V, MD        ROS Review of Systems  Constitutional: Negative for activity change, appetite change, chills, fatigue and unexpected weight change.  HENT: Negative for congestion, mouth sores and sinus pressure.   Eyes: Negative for visual disturbance.  Respiratory: Negative for cough and chest tightness.   Gastrointestinal: Negative for abdominal pain and nausea.  Genitourinary: Negative for difficulty urinating, frequency and vaginal pain.  Musculoskeletal: Negative for back pain and gait problem.  Skin: Negative for pallor and rash.  Neurological: Negative for dizziness, tremors, weakness, numbness and headaches.  Psychiatric/Behavioral: Negative for confusion and sleep disturbance.    Objective:  BP (!) 145/90   Pulse 65   Temp 97.6 F (36.4 C)  (Oral)   Ht 5\' 2"  (1.575 m)   Wt 186 lb (84.4 kg)   SpO2 99%   BMI 34.02 kg/m   BP Readings from Last 3 Encounters:  11/24/17 (!) 145/90  08/26/17 114/70  05/11/17 130/65    Wt Readings from Last 3 Encounters:  11/24/17 186 lb (84.4 kg)  08/26/17 187 lb (84.8 kg)  05/11/17 188 lb (85.3 kg)    Physical Exam  Constitutional: She appears well-developed. No distress.  HENT:  Head: Normocephalic.  Right Ear: External ear normal.  Left Ear: External ear normal.  Nose: Nose normal.  Mouth/Throat: Oropharynx is clear and moist.  Eyes: Conjunctivae are normal. Pupils are equal, round, and reactive to light. Right eye exhibits no discharge. Left eye exhibits no discharge.  Neck: Normal range of motion. Neck supple. No JVD present. No tracheal deviation present. No thyromegaly present.  Cardiovascular: Normal rate, regular rhythm and normal heart sounds.  Pulmonary/Chest: No stridor. No respiratory distress. She has no wheezes.  Abdominal: Soft. Bowel sounds are normal. She exhibits no distension and no mass. There is no tenderness. There is no rebound and no guarding.  Musculoskeletal: She exhibits no edema or tenderness.  Lymphadenopathy:    She has no cervical adenopathy.  Neurological: She displays normal reflexes. No cranial nerve deficit. She exhibits normal muscle tone. Coordination normal.  Skin: No rash noted. No erythema.  Psychiatric: She has a normal mood and affect. Her behavior is normal. Judgment and thought content normal.  obese  Lab  Results  Component Value Date   WBC 8.6 06/01/2016   HGB 15.2 (H) 06/01/2016   HCT 44.8 06/01/2016   PLT 418.0 (H) 06/01/2016   GLUCOSE 99 06/01/2016   ALT 32 06/01/2016   AST 24 06/01/2016   NA 138 06/01/2016   K 4.0 06/01/2016   CL 105 06/01/2016   CREATININE 0.66 06/01/2016   BUN 11 06/01/2016   CO2 25 06/01/2016   TSH 1.00 06/01/2016    Mm Diag Breast Tomo Uni Right  Result Date: 10/30/2016 CLINICAL DATA:  Screening  recall for asymmetry seen in the right breast on the MLO view only. EXAM: 2D DIGITAL DIAGNOSTIC UNILATERAL RIGHT MAMMOGRAM WITH CAD AND ADJUNCT TOMO COMPARISON:  Previous exam(s). ACR Breast Density Category b: There are scattered areas of fibroglandular density. FINDINGS: Cc and MLO tomograms were performed of the right breast. The initially questioned possible right breast asymmetry resolves on the additional imaging with findings compatible with overlapping fibroglandular tissue. There is no mammographic evidence of malignancy in the right breast. Mammographic images were processed with CAD. IMPRESSION: No mammographic evidence of malignancy in the right breast. RECOMMENDATION: Screening mammogram in one year.(Code:SM-B-01Y) I have discussed the findings and recommendations with the patient. Results were also provided in writing at the conclusion of the visit. If applicable, a reminder letter will be sent to the patient regarding the next appointment. BI-RADS CATEGORY  1: Negative. Electronically Signed   By: Everlean Alstrom M.D.   On: 10/30/2016 12:16    Assessment & Plan:   Diagnoses and all orders for this visit:  Taste sense altered  Essential hypertension   I am having Denise Donaldson maintain her Cholecalciferol, aspirin, triamcinolone, carvedilol, pantoprazole, and amLODipine. We will continue to administer sodium chloride.  No orders of the defined types were placed in this encounter.    Follow-up: Return in about 3 months (around 02/24/2018) for a follow-up visit.  Walker Kehr, MD

## 2017-11-24 NOTE — Assessment & Plan Note (Signed)
Try gluten free diet 

## 2017-11-24 NOTE — Assessment & Plan Note (Addendum)
Stopped Amlodipine, Coreg - generic dissolves in mouth - upset stomach Coreg - pls restart

## 2017-11-24 NOTE — Assessment & Plan Note (Addendum)
?  etiology. Pt became a vegetarian. Labs, EGD, iron, vitamins -  ok Use [employer] Peroxicare tooth paste

## 2017-11-24 NOTE — Patient Instructions (Addendum)
Use [employer] Peroxic[employer] tooth paste    Gluten free trial for 4-6 weeks. OK to use gluten-free bread and gluten-free pasta. ????????????? ????? ??? ???????? (Gluten-Free Diet for Celiac Disease) ?????? (??????????) - ??? ?????, ???????????? ? ?????? ???????, ???, ?????? ? ????????? (??????? ??????? ? ???). ????, ?????????? ?????????, ?????? ?????????????? ????????????? ?????. ??? ???????? ?????????? ?????? ???????? ????, ? ????? ????? ???????? ? ??????????? ?????????.  ??????? ?????????? ????? ?????????? ???? ? ??? ???????, ????? ??????????? ???????? ???????????. ??? ???????? ?????????? ?????????? ?? ????? ???? ????????? ? ???????????. ??? ??????? ????????? ???????????? ?????????, ?? ??? ????? ???? ????? ???????. ??? ??? ????? ????? ? ????????????? ??????  ????? ????????, ?????????? ?????? "GF" (??? ???????). ????? ????????? GF ????????? ???????????????? ?????????? ??? ???????????? ???????? ???????.  ???????? ????????. ?????????? ????? ????????? ? ???????? ???????????????? ??????????, ????????, ? ???????????? ???? ??? ?????????. ?????? ?????????? ???????? ? ????????? ?????? ???????????? ????????????. ?????????? ?? ????? ?????????? ??? ??????, ???? ? ??? ???? ??????? ?? ?????? ????????? ????????? ??? ????? ?????? ? ?????? ????????? GF.  ??????????, ???? ????????????. ???? ?? ?? ??????? ? ???, ??? ???????? ?? ???????? ??????????, ????? ???????? ? ????????????? ???????? ???????? ?? ??, ??? ????????? ????????????? ????????? ??????? ????? ??? ?????????????? ?????? ???????????. ?????? ??????????? ???????? ????????.  ??????? ?????, ??? ???????????? ????. ????????? ???? ? ?????????? ???????? ??????? ????? ???????????? ? ????????????? ????????? ?????????, ??? ?????????? ?????, ??? ????????? ??? ??? ???? ???????, ? ????? ????? ??????????? ?????????? ? ???????? ????????. ??? ???????? ?????????, ???? ?? ????????? ?? ????. ???????? ? ????????? ?????????, ???? ?? ? ??? ???? ??? ???????.  ??????? ?? ?????????  ???????????? ????????????. ???????????? ??????????? ??????????, ????? ????????????? ???????? ???????? ? ??????? ? ???????? ??????????, ??????? ???????? ??????. ??? ????? ?????? ? ???????? ????????????. ?????? ?????????? ?????? ???????????? ? ????????? ???????? ?? ?????????????? ?? ?????????, ????????, "????? ????????? ??????."  ??????????????? ???????????????? ?????. ????? ?????, ????? ? ????? ??????? ???? ??????????? ?????????? ?????????, ?????? ? ?????????. ????? ???????? ?? ???????????? ???????? ????? ??? ??????? ? ??????????? ??????????????? ?????? ???????????????? ?????, ?????????? ????????? ?????????? ????????, ????? ??? ?????, ??????, ??????? (????????) ????, ???????, ???????? ????????.  ??????? ??????????? ?????? ? ?????? ????????????? ????????????? ? ??????????? ???????. ??????? ???????? ??? ? ????? ??????. ????? ???????? ????? ???????? ??????? ??????????? ?????? ???????? ????? ??? ??????????? ??????????. ???????? ??????? ??? ?????????? ??????? ??????????? ? ??? ?????????, ??????? ?? ?????? ???????????? ? ????. ????? ??? ???????? ?????????? ??????? ?????????:   ????, ??????????? ????, ????????????? ????, ????? ????, ???? [?? ??????? ???????], ???? ????, ??????????????? ????, ???? ??? ???????????????????? ?????, ?????? ????? ??? ?????? (?????).  ???????, ????????, ???????????????? ??????? ??????? ??? ????????.  ???????????, ??????????? ??? ???????????.  ????? ???? ?????????? ?????????????, ????????? ??? ?????? (????? ???????????? ?? ?????? ? ????? ??????? ? ?????? ?????????? ??????, ?????????? ?????? ? ????????? ????????).  ??????????????? ???????????? ?????. ????? ???????? ????? ????? ???? ???????? ?????? ????? ????????????? ?????????, ??????? ????????? ??? ????????????? ?????.  ???????? ????????, ????????????? ?? ???? ??? ????? ????????? ?????:???????,??????? ????, 100% ????????? ????, ????????, ?????, ???????? ???? ??? ???????, GF ????, ??????, ???, ?????, tef, ????? ????????????? 100%  GF ?????? ?????, ??????? ?????, ??????? ?? ?????? ?????????? ????, ???????, ???????, ????????? ?????, ? ????? ???? ?? ?????????? ????. ??????? ? ?????? ?????????, ????? ????????? ????? ??? ? ??????? ??? ????? ????. ????????, ??? ??? ????? ???, ? ????? ??????????? GF ?????????? ???????. ????????? ????????? ???? ??????? ????? ??? ??????? ?????. ??????? ?? ???????, ?????????? ??????, ?????????? ????, ?????????? ????????, ?????????? ??????, ?????????? ???????, ???????????? ????, ???????????? ???????, ??????? ??????. ??????? ???? ?? ????????, ???????????, ???????? ????. ???? ?? ???????????? ????, ?????? ????, ??????? ?? ???????. ????? ????? ??????, ???????????? ??? ???????????????? ?????.  ?????? ????? ??????, ???????????? ??? ???????????????? ??????, ??????????, ????????? ????.  ???? ? ?????? ????????? ????? ????, ????, ?????, ??? ????, ?????????????? ??? ?????????? ???????, ???, ?????? ??? ?????????. ????????? ?????????? ???? ? ????????? ???????. ?????? (??? ????????) ????. ??? ??????????? ????, ???????? ???????????? ?????? ????????, ????????? ???? ????????? ???????, ????????? ???? ?????????? ????. ??????? ???? ? ???????, ????????.  ???????? ???????? ?????? ? ??????, ?????????????? ?? ??????????? ????????????.  ??????? ???? (??????? ??? ??? ???????), ???, ???????? ??? (???????? ????????, ????? ???? ??????????, ??? ??? ?? ???????? ?????? ???????). ???????????? ???????, ????????? ???? ????????? ????. ????, ????, ??????? ?? ?????? ????????????????? ??????, ????????, ????, ????? ? ?????.  GF ???? ? GF ????.  ????????? ??????? ?????, ???, ????????? ??????, ??????, ????, ???????, ??????? ??????? ??? ????????????, ?????, ???????? ??????? ??? ?????????, ?????? ???????? ???? ??? ???? ?????????. ?????. ???????? ????, ????????? ????? ??? ????????, ???????? ??????? ?? ??????????? ????????, ???? ? ???????. ??????????? ???????, ????????? ?????????, ????????? ???, ??????. ?????, ???????? ? ?????? ???? ???????,  ?????????????? ?? ??????????? ????????????. ????????? ???? ????????????? ??????????. ??????? ? ?????????, ????? ?????? ??????? ????? ???????????.  ???? ? ????? [?????????] ?????, ????????, ???????????? ?????, ???????, ?? ?????????? ???????????????? ??????? ???????, ??????? ??????, [???????] ????????????, ????, ??????, ????????? ???? ????????. ????????? ???? ??????? ???????? ??? ???????. ?????????? ?????.  ?????? ???????? ??????? ? ????, ?????????????? ?? ??????????? ????????????; ????????? ???? ???????????????? ??? ???????????? ?????. ????? ?????? ?????????? ? ????????????????? ??? ??????? ????????, ??????? ?? ???????? ??????????. ???????? ?????? (MSG). ????, ??????? ? ?????? ?????. ????????????? ????, ???? ??? ???????. ????????? ???? ??????? ????? (??????). ??????? ? ?????????, ????? ????????? ???? ????? ????????? ? ????. ?????, ?????, ???????, ?????? ?????-???????. ????, ?????, ?????, ???????, ??????? ?????????. ?????????????  ???? ?? ???????? ?????? ??????? ??????????????? ????????? ??????? ??? ????????. ? ????????? ?????? ????????? ??????????????????? ? ??????????.  ????? ???????? ???? ??????? ???? ???????? ?????? ????? ????????????? ?????????, ??????? ?? ??????????? ??????????? ??? ????????????? ?????.  ???????? ??????, ??????, ??????, ????????? ???????, ???? ??????? ??????, ?????, ????, ???????? ???????, ??? ???? ?? ??????? ? ???, ??????? ????? ? ?????. ????????? ???????????? ??? ? ???????, ??????? ???????? ????? ? ???????? ?????????????, ????????, ??????? ????. ????? ????????? ???????????? ??????? ?????, ????????, ???????, ??????????? ?????????? ??????? ??????, ? ???? ????????? ?????????, ?????????? ???????, ????, ?????? ??? ?????????.  ????? ??????????? ????????? ??????, ??????????? ??????, ???????????????? ? ??????, ? ????? ????? ?????, ?????????????? ? ??????????? ???????, ???, ?????? ??? ?????????.  ?????? ??????? ????????? ????; ????????? ????????? ??????????? ??? ???????.  ???? ? ??????  ????????? ????? ????? ???? ??? ????????????, ?????????? ???????, ????, ?????? ??? ???????????? ????????????? (????????, ????????? ???-????, ??????, ??????? ??????? ??????? ??? ???????). ????????, ?????????? ?????? ?????????, ????????, ??????????? ?????, ???????, ??????? ? ?????????. ??????????? ????????? ?? ????? ? ???????????? ???????, ???????, ?????????????? ? ????????????????? ???????????? ?????? ? ??????? ?????; ????? ???????? ?? ????, ?????????? ? ???????? ??????????? ??????? ??????. ????????? ?????????? (??????). ???????? ????. ???????? ???????? ???????????? ???????? ???????, ? ??????? ????? ??????????? ???????? ? ????????? ??????. ??????? ????????? ???????? ???????. ???? ? ???? (???? ?????? ??? ?? ????? ?????????? GF), ???, ????????? ??????, ????????? ????? ????????? ????. ????????? ??????? ???????????? ???????, ?????????? ???????, ????, ?????? ??? ?????????. ????????? ??????, ???????????? ????????? ?????. ????? ? ???????? ????? ???????? ??????? ????. ?????, ???????, ???????, ???????, ??????? ????????? ? ?????????????? ?????????, ????????, ?????? ???? ??? ???? ?????????. ????????? ???? ????????????? ??????????, ????????????????? ????????? ? ??????????? ???????, ????????? ??? ????????. ????????? ? ???????????. ???????????? ????? ??? ??????, ??????? ? ?????? ????????, ???? ?????? ??? ?? ???????? ??? GF. ??????? ?????? ? ?????? ???????, ?????????????? ? ??????????? ????. ???? ? ????? ????????? ???????????? ???????? ???????? ? ???????, ?????????? ???????????????? ??????? ???????.  ????????/????????? ????? ????????? ???? ???????? ?????, ????????? ????? ????? ???????, ????????? ??????? ?????????, ????????? ?????? ?????, ????????? ???? ???????, ????????? ???? ???????, ?????. ?????? ????? ????, ?????????? ???? ?? ???????, ???, ?????? ??? ?????????. ????????? ??????, ?????????? ??????????????? ???????????? ?????. ?????????? ??? ??????? ????????, ?????????? ??????????. ????????? ???? ??????? ?????,  ????????? ???? ??????, ????????? ???? ??????????? ???????. ????????? ???????? (???????? ??????). ??????? ???????? (????? ????????? ?????). ????????????? ???? ?? ???????? ?????? ??????? ?? ????????????? ????????? ??????? ??? ????????. ?????????? ? ????????? ??? ????????? ?????????????? ??????????. ??? ?????????? ?? ????? ???????? ??????, ??????????????? ????? ??????. ??????????? ???????? ????? ???????????? ??? ??????? ? ????? ??????? ??????.  Document Released: 08/20/2008 Document Revised: 09/12/2013 Elsevier Interactive Patient Education  2017 [employer].    Gluten-Free Diet for Celiac Disease, Adult The gluten-free diet includes all foods that do not contain gluten. Gluten is a protein that is found in wheat, rye, barley, and some other grains. Following the gluten-free diet is the only treatment for people with celiac disease. It helps to prevent damage to the intestines and improves or eliminates the symptoms of celiac disease. Following the gluten-free diet requires some planning. It can be challenging at first, but it gets easier with time and practice. There are more gluten-free options available today than ever before. If you need help finding gluten-free foods or if you have questions, talk with your diet and nutrition specialist (registered dietitian) or your health care provider. What do I need to know about a gluten-free diet?  All fruits, vegetables, and meats are safe to eat and do not contain gluten.  When grocery shopping, start by shopping in the produce, meat, and dairy sections. These sections are more likely to contain gluten-free foods. Then move to the aisles that contain packaged foods if you need to.  Read all food labels. Gluten is often added to foods. Always check the ingredient list and look for warnings, such as "may contain gluten."  Talk with your dietitian or health care provider before taking a gluten-free multivitamin or mineral supplement.  Be aware of  gluten-free foods having contact with foods that contain gluten (cross-contamination). This can happen at home and with any processed foods. ? Talk with your health care provider or dietitian about how to reduce the risk of cross-contamination in your home. ? If you have questions about how a food is processed, ask the manufacturer. What key words help to identify gluten? Foods that list any of these key words on the label usually contain gluten:  Wheat, flour, enriched flour, bromated flour, white flour, durum flour, graham flour, phosphated flour, self-rising flour, semolina, farina, barley (malt), rye, and oats.  Starch, dextrin, modified food starch, or cereal.  Thickening, fillers, or emulsifiers.  Malt flavoring, malt extract, or malt syrup.  Hydrolyzed vegetable protein.  In the [place] packaged foods that are gluten-free are required to be labeled "GF." These foods should be easy to identify and are safe to eat. In the [place] food companies are also required to list common food allergens, including wheat, on their labels. Recommended foods Grains  Amaranth, bean flours, 100% buckwheat flour, corn, millet, nut flours or nut meals, GF oats, quinoa, rice, sorghum, teff, rice wafers, pure cornmeal tortillas, popcorn, and hot cereals made from cornmeal. Hominy, rice, wild rice. Some Asian rice noodles or bean noodles. Arrowroot starch, corn bran, corn flour, corn germ, cornmeal, corn starch, potato flour, potato starch flour, and rice bran. Plain, brown, and sweet rice flours. Rice polish, soy flour, and tapioca starch. Vegetables  All plain fresh, frozen, and canned vegetables. Fruits  All plain fresh, frozen, canned, and dried fruits, and 100% fruit juices. Meats and other protein foods  All fresh beef, pork, poultry, fish, seafood, and eggs. Fish canned in water, oil, brine, or vegetable broth. Plain nuts and seeds, peanut butter. Some lunch meat and some frankfurters. Dried beans,  dried peas, and lentils. Dairy  Fresh plain, dry, evaporated, or condensed milk. Cream, butter, sour cream, whipping cream, and most yogurts. Unprocessed cheese, most processed cheeses, some cottage cheese, some cream cheeses. Beverages  Coffee, tea, most herbal teas. Carbonated beverages and some root beers. Wine,  sake, and distilled spirits, such as gin, vodka, and whiskey. Most hard ciders. Fats and oils  Butter, margarine, vegetable oil, hydrogenated butter, olive oil, shortening, lard, cream, and some mayonnaise. Some commercial salad dressings. Olives. Sweets and desserts  Sugar, honey, some syrups, molasses, jelly, and jam. Plain hard candy, marshmallows, and gumdrops. Pure cocoa powder. Plain chocolate. Custard and some pudding mixes. Gelatin desserts, sorbets, frozen ice pops, and sherbet. Cake, cookies, and other desserts prepared with allowed flours. Some commercial ice creams. Cornstarch, tapioca, and rice puddings. Seasoning and other foods  Some canned or frozen soups. Monosodium glutamate (MSG). Cider, rice, and wine vinegar. Baking soda and baking powder. Cream of tartar. Baking and nutritional yeast. Certain soy sauces made without wheat (ask your dietitian about specific brands that are allowed). Nuts, coconut, and chocolate. Salt, pepper, herbs, spices, flavoring extracts, imitation or artificial flavorings, natural flavorings, and food colorings. Some medicines and supplements. Some lip glosses and other cosmetics. Rice syrups. The items listed may not be a complete list. Talk with your dietitian about what dietary choices are best for you. Foods to avoid Grains  Barley, bran, bulgur, couscous, cracked wheat, [place], farro, graham, malt, matzo, semolina, wheat germ, and all wheat and rye cereals including spelt and kamut. Cereals containing malt as a flavoring, such as rice cereal. Noodles, spaghetti, macaroni, most packaged rice mixes, and all mixes containing wheat, rye,  barley, or triticale. Vegetables  Most creamed vegetables and most vegetables canned in sauces. Some commercially prepared vegetables and salads. Fruits  Thickened or prepared fruits and some pie fillings. Some fruit snacks and fruit roll-ups. Meats and other protein foods  Any meat or meat alternative containing wheat, rye, barley, or gluten stabilizers. These are often marinated or packaged meats and lunch meats. Bread-containing products, such as Swiss steak, croquettes, meatballs, and meatloaf. Most tuna canned in vegetable broth and [place] with hydrolyzed vegetable protein (HVP) injected as part of the basting. Seitan. Imitation fish. Eggs in sauces made from ingredients to avoid. Dairy  Commercial chocolate milk drinks and malted milk. Some non-dairy creamers. Any cheese product containing ingredients to avoid. Beverages  Certain cereal beverages. Beer, ale, malted milk, and some root beers. Some hard ciders. Some instant flavored coffees. Some herbal teas made with barley or with barley malt added. Fats and oils  Some commercial salad dressings. Sour cream containing modified food starch. Sweets and desserts  Some toffees. Chocolate-coated nuts (may be rolled in wheat flour) and some commercial candies and candy bars. Most cakes, cookies, donuts, pastries, and other baked goods. Some commercial ice cream. Ice cream cones. Commercially prepared mixes for cakes, cookies, and other desserts. Bread pudding and other puddings thickened with flour. Products containing brown rice syrup made with barley malt enzyme. Desserts and sweets made with malt flavoring. Seasoning and other foods  Some curry powders, some dry seasoning mixes, some gravy extracts, some meat sauces, some ketchups, some prepared mustards, and horseradish. Certain soy sauces. Malt vinegar. Bouillon and bouillon cubes that contain HVP. Some chip dips, and some chewing gum. Yeast extract. Brewer's yeast. Caramel color. Some  medicines and supplements. Some lip glosses and other cosmetics. The items listed may not be a complete list. Talk with your dietitian about what dietary choices are best for you. Summary  Gluten is a protein that is found in wheat, rye, barley, and some other grains. The gluten-free diet includes all foods that do not contain gluten.  If you need help finding gluten-free foods or if you  have questions, talk with your diet and nutrition specialist (registered dietitian) or your health care provider.  Read all food labels. Gluten is often added to foods. Always check the ingredient list and look for warnings, such as "may contain gluten." This information is not intended to replace advice given to you by your health care provider. Make sure you discuss any questions you have with your health care provider. Document Released: 09/07/2005 Document Revised: 06/22/2016 Document Reviewed: 06/22/2016 Elsevier Interactive Patient Education  2018 [employer].

## 2017-11-25 ENCOUNTER — Other Ambulatory Visit: Payer: Self-pay | Admitting: Internal Medicine

## 2017-11-25 LAB — IRON,TIBC AND FERRITIN PANEL
%SAT: 20 % (calc) (ref 11–50)
FERRITIN: 88 ng/mL (ref 20–288)
IRON: 72 ug/dL (ref 45–160)
TIBC: 356 ug/dL (ref 250–450)

## 2017-11-25 MED ORDER — CHOLECALCIFEROL 25 MCG (1000 UT) PO TABS: 2000.0000 [IU] | ORAL_TABLET | Freq: Every day | ORAL | 3 refills | Status: DC

## 2017-11-25 MED ORDER — VITAMIN D3 1.25 MG (50000 UT) PO CAPS: 1.0000 | ORAL_CAPSULE | ORAL | 0 refills | Status: DC

## 2018-02-22 ENCOUNTER — Ambulatory Visit: Payer: BLUE CROSS/BLUE SHIELD | Admitting: Internal Medicine

## 2018-03-02 ENCOUNTER — Encounter: Payer: Self-pay | Admitting: Internal Medicine

## 2018-03-02 ENCOUNTER — Ambulatory Visit (INDEPENDENT_AMBULATORY_CARE_PROVIDER_SITE_OTHER): Payer: BLUE CROSS/BLUE SHIELD | Admitting: Internal Medicine

## 2018-03-02 DIAGNOSIS — K2100 Gastro-esophageal reflux disease with esophagitis, without bleeding: Secondary | ICD-10-CM

## 2018-03-02 DIAGNOSIS — E559 Vitamin D deficiency, unspecified: Secondary | ICD-10-CM | POA: Diagnosis not present

## 2018-03-02 DIAGNOSIS — I10 Essential (primary) hypertension: Secondary | ICD-10-CM | POA: Diagnosis not present

## 2018-03-02 DIAGNOSIS — R432 Parageusia: Secondary | ICD-10-CM | POA: Diagnosis not present

## 2018-03-02 MED ORDER — AMLODIPINE BESYLATE 2.5 MG PO TABS: 2.5000 mg | ORAL_TABLET | Freq: Two times a day (BID) | ORAL | 3 refills | Status: DC

## 2018-03-02 NOTE — Progress Notes (Signed)
Subjective:  Patient ID: Denise Donaldson, female    DOB: Apr 10, 1956  Age: 62 y.o. MRN: [id number]  CC: No chief complaint on file.   HPI Denise Donaldson presents for HTN - poor Rx tolerance: coreg is causing SOB. Off Norvasc Gluten free diet did not help...  Outpatient Medications Prior to Visit  Medication Sig Dispense Refill  . amLODipine (NORVASC) 2.5 MG tablet Take 1 tablet (2.5 mg total) by mouth 2 (two) times daily. 180 tablet 3  . aspirin 81 MG tablet Take 81 mg by mouth as needed.    . carvedilol (COREG) 12.5 MG tablet Take 1 tablet (12.5 mg total) by mouth 2 (two) times daily with a meal. 60 tablet 11  . Cholecalciferol (VITAMIN D3) 50000 units CAPS Take 1 capsule by mouth once a week. 6 capsule 0  . Cholecalciferol 1000 units tablet Take 2 tablets (2,000 Units total) by mouth daily. 100 tablet 3  . pantoprazole (PROTONIX) 40 MG tablet TAKE 1 TABLET BY MOUTH EVERY DAY 30 tablet 2  . triamcinolone (KENALOG) 0.025 % ointment Apply topically 2 (two) times daily. 30 g 2   Facility-Administered Medications Prior to Visit  Medication Dose Route Frequency Provider Last Rate Last Dose  . 0.9 %  sodium chloride infusion  500 mL Intravenous Continuous Nandigam, Kavitha V, MD        ROS: Review of Systems  Constitutional: Negative for activity change, appetite change, chills, fatigue and unexpected weight change.  HENT: Negative for congestion, mouth sores and sinus pressure.   Eyes: Negative for visual disturbance.  Respiratory: Negative for cough and chest tightness.   Gastrointestinal: Positive for abdominal pain. Negative for nausea.  Genitourinary: Negative for difficulty urinating, frequency and vaginal pain.  Musculoskeletal: Negative for back pain and gait problem.  Skin: Negative for pallor and rash.  Neurological: Negative for dizziness, tremors, weakness, numbness and headaches.  Psychiatric/Behavioral: Negative for confusion and sleep disturbance.     Objective:  BP 136/82 (BP Location: Left Arm, Patient Position: Sitting, Cuff Size: Large)   Pulse 70   Temp 98.2 F (36.8 C) (Oral)   Ht 5\' 2"  (1.575 m)   Wt 187 lb (84.8 kg)   SpO2 98%   BMI 34.20 kg/m   BP Readings from Last 3 Encounters:  03/02/18 136/82  11/24/17 (!) 145/90  08/26/17 114/70    Wt Readings from Last 3 Encounters:  03/02/18 187 lb (84.8 kg)  11/24/17 186 lb (84.4 kg)  08/26/17 187 lb (84.8 kg)    Physical Exam  Constitutional: She appears well-developed. No distress.  HENT:  Head: Normocephalic.  Right Ear: External ear normal.  Left Ear: External ear normal.  Nose: Nose normal.  Mouth/Throat: Oropharynx is clear and moist.  Eyes: Pupils are equal, round, and reactive to light. Conjunctivae are normal. Right eye exhibits no discharge. Left eye exhibits no discharge.  Neck: Normal range of motion. Neck supple. No JVD present. No tracheal deviation present. No thyromegaly present.  Cardiovascular: Normal rate, regular rhythm and normal heart sounds.  Pulmonary/Chest: No stridor. No respiratory distress. She has no wheezes.  Abdominal: Soft. Bowel sounds are normal. She exhibits no distension and no mass. There is no tenderness. There is no rebound and no guarding.  Musculoskeletal: She exhibits no edema or tenderness.  Lymphadenopathy:    She has no cervical adenopathy.  Neurological: She displays normal reflexes. No cranial nerve deficit. She exhibits normal muscle tone. Coordination normal.  Skin: No rash noted. No erythema.  Psychiatric:  She has a normal mood and affect. Her behavior is normal. Judgment and thought content normal.  Obese  Lab Results  Component Value Date   WBC 8.5 11/24/2017   HGB 15.0 11/24/2017   HCT 44.6 11/24/2017   PLT 426.0 (H) 11/24/2017   GLUCOSE 88 11/24/2017   CHOL 207 (H) 11/24/2017   TRIG 125.0 11/24/2017   HDL 69.30 11/24/2017   LDLCALC 113 (H) 11/24/2017   ALT 23 11/24/2017   AST 18 11/24/2017   NA 138  11/24/2017   K 4.1 11/24/2017   CL 104 11/24/2017   CREATININE 0.69 11/24/2017   BUN 13 11/24/2017   CO2 27 11/24/2017   TSH 0.96 11/24/2017    Mm Diag Breast Tomo Uni Right  Result Date: 10/30/2016 CLINICAL DATA:  Screening recall for asymmetry seen in the right breast on the MLO view only. EXAM: 2D DIGITAL DIAGNOSTIC UNILATERAL RIGHT MAMMOGRAM WITH CAD AND ADJUNCT TOMO COMPARISON:  Previous exam(s). ACR Breast Density Category b: There are scattered areas of fibroglandular density. FINDINGS: Cc and MLO tomograms were performed of the right breast. The initially questioned possible right breast asymmetry resolves on the additional imaging with findings compatible with overlapping fibroglandular tissue. There is no mammographic evidence of malignancy in the right breast. Mammographic images were processed with CAD. IMPRESSION: No mammographic evidence of malignancy in the right breast. RECOMMENDATION: Screening mammogram in one year.(Code:SM-[id number]) I have discussed the findings and recommendations with the patient. Results were also provided in writing at the conclusion of the visit. If applicable, a reminder letter will be sent to the patient regarding the next appointment. BI-RADS CATEGORY  1: Negative. Electronically Signed   By: Everlean Alstrom M.D.   On: 10/30/2016 12:16    Assessment & Plan:   There are no diagnoses linked to this encounter.   No orders of the defined types were placed in this encounter.    Follow-up: No follow-ups on file.  Walker Kehr, MD

## 2018-03-02 NOTE — Assessment & Plan Note (Addendum)
Gluten free diet did not help: referral to ENT, referral to see an allergist, brain scan option were all discussed.  She would like to wait for a little longer before committing to the above.

## 2018-03-02 NOTE — Assessment & Plan Note (Signed)
Gluten free diet did not help

## 2018-03-02 NOTE — Assessment & Plan Note (Signed)
D/c Coreg Re-start Amlodipine

## 2018-03-02 NOTE — Assessment & Plan Note (Signed)
Vit D 

## 2018-07-06 ENCOUNTER — Ambulatory Visit: Payer: BLUE CROSS/BLUE SHIELD | Admitting: Internal Medicine

## 2018-07-13 ENCOUNTER — Ambulatory Visit: Payer: BLUE CROSS/BLUE SHIELD | Admitting: Internal Medicine

## 2018-08-22 ENCOUNTER — Ambulatory Visit (INDEPENDENT_AMBULATORY_CARE_PROVIDER_SITE_OTHER): Payer: BLUE CROSS/BLUE SHIELD | Admitting: Internal Medicine

## 2018-08-22 ENCOUNTER — Encounter: Payer: Self-pay | Admitting: Internal Medicine

## 2018-08-22 DIAGNOSIS — K2100 Gastro-esophageal reflux disease with esophagitis, without bleeding: Secondary | ICD-10-CM

## 2018-08-22 DIAGNOSIS — R519 Headache, unspecified: Secondary | ICD-10-CM | POA: Insufficient documentation

## 2018-08-22 DIAGNOSIS — R51 Headache: Secondary | ICD-10-CM

## 2018-08-22 DIAGNOSIS — R609 Edema, unspecified: Secondary | ICD-10-CM | POA: Diagnosis not present

## 2018-08-22 DIAGNOSIS — I10 Essential (primary) hypertension: Secondary | ICD-10-CM

## 2018-08-22 MED ORDER — PANTOPRAZOLE SODIUM 40 MG PO TBEC: 40.0000 mg | DELAYED_RELEASE_TABLET | Freq: Every day | ORAL | 2 refills | Status: DC

## 2018-08-22 MED ORDER — AMLODIPINE BESYLATE 2.5 MG PO TABS: 2.5000 mg | ORAL_TABLET | Freq: Two times a day (BID) | ORAL | 3 refills | Status: DC

## 2018-08-22 NOTE — Patient Instructions (Addendum)
Glytone cream for face

## 2018-08-22 NOTE — Assessment & Plan Note (Signed)
Resolved

## 2018-08-22 NOTE — Assessment & Plan Note (Signed)
Protonix.  ?

## 2018-08-22 NOTE — Assessment & Plan Note (Signed)
Migraines due to weather changes Advil prn

## 2018-08-22 NOTE — Progress Notes (Signed)
Subjective:  Patient ID: Denise Donaldson, female    DOB: 05-24-1956  Age: 62 y.o. MRN: [id number]  CC: No chief complaint on file.   HPI Denise Donaldson presents for HTN, GERD C/o HA today  Outpatient Medications Prior to Visit  Medication Sig Dispense Refill  . amLODipine (NORVASC) 2.5 MG tablet Take 1 tablet (2.5 mg total) by mouth 2 (two) times daily. 180 tablet 3  . aspirin 81 MG tablet Take 81 mg by mouth as needed.    . Cholecalciferol (VITAMIN D3) 50000 units CAPS Take 1 capsule by mouth once a week. 6 capsule 0  . Cholecalciferol 1000 units tablet Take 2 tablets (2,000 Units total) by mouth daily. 100 tablet 3  . pantoprazole (PROTONIX) 40 MG tablet TAKE 1 TABLET BY MOUTH EVERY DAY 30 tablet 2  . triamcinolone (KENALOG) 0.025 % ointment Apply topically 2 (two) times daily. 30 g 2   Facility-Administered Medications Prior to Visit  Medication Dose Route Frequency Provider Last Rate Last Dose  . 0.9 %  sodium chloride infusion  500 mL Intravenous Continuous Nandigam, Kavitha V, MD        ROS: Review of Systems  Constitutional: Negative for activity change, appetite change, chills, fatigue and unexpected weight change.  HENT: Negative for congestion, mouth sores and sinus pressure.   Eyes: Negative for visual disturbance.  Respiratory: Negative for cough and chest tightness.   Gastrointestinal: Negative for abdominal pain and nausea.  Genitourinary: Negative for difficulty urinating, frequency and vaginal pain.  Musculoskeletal: Negative for back pain and gait problem.  Skin: Negative for pallor and rash.  Neurological: Negative for dizziness, tremors, weakness, numbness and headaches.  Psychiatric/Behavioral: Negative for confusion, sleep disturbance and suicidal ideas.    Objective:  BP 136/84 (BP Location: Left Arm, Patient Position: Sitting, Cuff Size: Large)   Pulse 63   Temp 98 F (36.7 C) (Oral)   Ht 5\' 2"  (1.575 m)   Wt 189 lb (85.7 kg)   SpO2 98%    BMI 34.57 kg/m   BP Readings from Last 3 Encounters:  08/22/18 136/84  03/02/18 136/82  11/24/17 (!) 145/90    Wt Readings from Last 3 Encounters:  08/22/18 189 lb (85.7 kg)  03/02/18 187 lb (84.8 kg)  11/24/17 186 lb (84.4 kg)    Physical Exam  Constitutional: She appears well-developed. No distress.  HENT:  Head: Normocephalic.  Right Ear: External ear normal.  Left Ear: External ear normal.  Nose: Nose normal.  Mouth/Throat: Oropharynx is clear and moist.  Eyes: Pupils are equal, round, and reactive to light. Conjunctivae are normal. Right eye exhibits no discharge. Left eye exhibits no discharge.  Neck: Normal range of motion. Neck supple. No JVD present. No tracheal deviation present. No thyromegaly present.  Cardiovascular: Normal rate, regular rhythm and normal heart sounds.  Pulmonary/Chest: No stridor. No respiratory distress. She has no wheezes.  Abdominal: Soft. Bowel sounds are normal. She exhibits no distension and no mass. There is no tenderness. There is no rebound and no guarding.  Musculoskeletal: She exhibits no edema or tenderness.  Lymphadenopathy:    She has no cervical adenopathy.  Neurological: She displays normal reflexes. No cranial nerve deficit. She exhibits normal muscle tone. Coordination normal.  Skin: No rash noted. No erythema.  Psychiatric: She has a normal mood and affect. Her behavior is normal. Judgment and thought content normal.    Lab Results  Component Value Date   WBC 8.5 11/24/2017   HGB 15.0 11/24/2017  HCT 44.6 11/24/2017   PLT 426.0 (H) 11/24/2017   GLUCOSE 88 11/24/2017   CHOL 207 (H) 11/24/2017   TRIG 125.0 11/24/2017   HDL 69.30 11/24/2017   LDLCALC 113 (H) 11/24/2017   ALT 23 11/24/2017   AST 18 11/24/2017   NA 138 11/24/2017   K 4.1 11/24/2017   CL 104 11/24/2017   CREATININE 0.69 11/24/2017   BUN 13 11/24/2017   CO2 27 11/24/2017   TSH 0.96 11/24/2017    Mm Diag Breast Tomo Uni Right  Result Date:  10/30/2016 CLINICAL DATA:  Screening recall for asymmetry seen in the right breast on the MLO view only. EXAM: 2D DIGITAL DIAGNOSTIC UNILATERAL RIGHT MAMMOGRAM WITH CAD AND ADJUNCT TOMO COMPARISON:  Previous exam(s). ACR Breast Density Category b: There are scattered areas of fibroglandular density. FINDINGS: Cc and MLO tomograms were performed of the right breast. The initially questioned possible right breast asymmetry resolves on the additional imaging with findings compatible with overlapping fibroglandular tissue. There is no mammographic evidence of malignancy in the right breast. Mammographic images were processed with CAD. IMPRESSION: No mammographic evidence of malignancy in the right breast. RECOMMENDATION: Screening mammogram in one year.(Code:SM-B-01Y) I have discussed the findings and recommendations with the patient. Results were also provided in writing at the conclusion of the visit. If applicable, a reminder letter will be sent to the patient regarding the next appointment. BI-RADS CATEGORY  1: Negative. Electronically Signed   By: Everlean Alstrom M.D.   On: 10/30/2016 12:16    Assessment & Plan:   There are no diagnoses linked to this encounter.   No orders of the defined types were placed in this encounter.    Follow-up: No follow-ups on file.  Walker Kehr, MD

## 2018-11-15 ENCOUNTER — Telehealth: Payer: Self-pay | Admitting: Internal Medicine

## 2018-11-15 NOTE — Telephone Encounter (Signed)
Copied from [place] [id number]. Topic: Quick Communication - See Telephone Encounter >> Nov 15, 2018 10:03 AM Rayann Heman wrote: CRM for notification. See Telephone encounter for: 11/15/18. Pt called and stated that she has been having high blood pressure 170-190. Pt did not want to speak with triage nurse. Pt states that this has been going on for 2 months. Pt will have insurance on march 1st and would like to know if Dr plotnikov could work her in on 11/21/18. Please call patient back.

## 2018-11-16 NOTE — Telephone Encounter (Signed)
Okay to work in

## 2018-11-16 NOTE — Telephone Encounter (Signed)
Patient could no longer come in on 3/2 or 3/3 due to appointments scheduled already.  I have patient scheduled for 3/4.

## 2018-11-21 ENCOUNTER — Ambulatory Visit: Payer: BLUE CROSS/BLUE SHIELD | Admitting: Internal Medicine

## 2018-11-23 ENCOUNTER — Encounter: Payer: Self-pay | Admitting: Internal Medicine

## 2018-11-23 ENCOUNTER — Ambulatory Visit (INDEPENDENT_AMBULATORY_CARE_PROVIDER_SITE_OTHER): Payer: PPO | Admitting: Internal Medicine

## 2018-11-23 VITALS — BP 165/100 | HR 66 | Temp 97.6°F | Ht 62.0 in | Wt 189.0 lb

## 2018-11-23 DIAGNOSIS — I10 Essential (primary) hypertension: Secondary | ICD-10-CM | POA: Diagnosis not present

## 2018-11-23 DIAGNOSIS — E559 Vitamin D deficiency, unspecified: Secondary | ICD-10-CM | POA: Diagnosis not present

## 2018-11-23 DIAGNOSIS — R0789 Other chest pain: Secondary | ICD-10-CM

## 2018-11-23 DIAGNOSIS — E785 Hyperlipidemia, unspecified: Secondary | ICD-10-CM

## 2018-11-23 DIAGNOSIS — R609 Edema, unspecified: Secondary | ICD-10-CM

## 2018-11-23 MED ORDER — NEBIVOLOL HCL 5 MG PO TABS: 5.0000 mg | ORAL_TABLET | Freq: Every day | ORAL | 11 refills | Status: DC

## 2018-11-23 NOTE — Assessment & Plan Note (Signed)
Vit D 

## 2018-11-23 NOTE — Assessment & Plan Note (Addendum)
  A cardiac Continue Norvasc Add Bystolic - low dose 5 mg - advance to 10 mg a day if tolerateCT scan for calcium scoring offered A cardiac CT scan for coronary calcium test offered

## 2018-11-23 NOTE — Assessment & Plan Note (Signed)
No relapse 

## 2018-11-23 NOTE — Patient Instructions (Addendum)
Cardiac CT calcium scoring test $150   Computed tomography, more commonly known as a CT or CAT scan, is a diagnostic medical imaging test. Like traditional x-rays, it produces multiple images or pictures of the inside of the body. The cross-sectional images generated during a CT scan can be reformatted in multiple planes. They can even generate three-dimensional images. These images can be viewed on a computer monitor, printed on film or by a 3D printer, or transferred to a CD or DVD. CT images of internal organs, bones, soft tissue and blood vessels provide greater detail than traditional x-rays, particularly of soft tissues and blood vessels. A cardiac CT scan for coronary calcium is a non-invasive way of obtaining information about the presence, location and extent of calcified plaque in the coronary arteries-the vessels that supply oxygen-containing blood to the heart muscle. Calcified plaque results when there is a build-up of fat and other substances under the inner layer of the artery. This material can calcify which signals the presence of atherosclerosis, a disease of the vessel wall, also called coronary artery disease (CAD). People with this disease have an increased risk for heart attacks. In addition, over time, progression of plaque build up (CAD) can narrow the arteries or even close off blood flow to the heart. The result may be chest pain, sometimes called "angina," or a heart attack. Because calcium is a marker of CAD, the amount of calcium detected on a cardiac CT scan is a helpful prognostic tool. The findings on cardiac CT are expressed as a calcium score. Another name for this test is coronary artery calcium scoring.  What are some common uses of the procedure? The goal of cardiac CT scan for calcium scoring is to determine if CAD is present and to what extent, even if there are no symptoms. It is a screening study that may be recommended by a physician for patients with risk factors  for CAD but no clinical symptoms. The major risk factors for CAD are: . high blood cholesterol levels  . family history of heart attacks  . diabetes  . high blood pressure  . cigarette smoking  . overweight or obese  . physical inactivity   A negative cardiac CT scan for calcium scoring shows no calcification within the coronary arteries. This suggests that CAD is absent or so minimal it cannot be seen by this technique. The chance of having a heart attack over the next two to five years is very low under these circumstances. A positive test means that CAD is present, regardless of whether or not the patient is experiencing any symptoms. The amount of calcification-expressed as the calcium score-may help to predict the likelihood of a myocardial infarction (heart attack) in the coming years and helps your medical doctor or cardiologist decide whether the patient may need to take preventive medicine or undertake other measures such as diet and exercise to lower the risk for heart attack. The extent of CAD is graded according to your calcium score:  Calcium Score  Presence of CAD  0 No evidence of CAD   1-10 Minimal evidence of CAD  11-100 Mild evidence of CAD  101-400 Moderate evidence of CAD  Over 400 Extensive evidence of CAD   Continue Norvasc Add Bystolic - low dose 5 mg - advance to 10 mg a day if tolerate

## 2018-11-23 NOTE — Progress Notes (Signed)
Subjective:  Patient ID: Denise Donaldson, female    DOB: September 30, 1955  Age: 63 y.o. MRN: [id number]  CC: No chief complaint on file.   HPI Denise Donaldson presents for elevated BP - SBP 150 at home C/o anxiety  Outpatient Medications Prior to Visit  Medication Sig Dispense Refill  . amLODipine (NORVASC) 2.5 MG tablet Take 1 tablet (2.5 mg total) by mouth 2 (two) times daily. 180 tablet 3  . aspirin 81 MG tablet Take 81 mg by mouth as needed.    . Cholecalciferol (VITAMIN D3) 50000 units CAPS Take 1 capsule by mouth once a week. 6 capsule 0  . Cholecalciferol 1000 units tablet Take 2 tablets (2,000 Units total) by mouth daily. 100 tablet 3  . pantoprazole (PROTONIX) 40 MG tablet Take 1 tablet (40 mg total) by mouth daily. 30 tablet 2  . triamcinolone (KENALOG) 0.025 % ointment Apply topically 2 (two) times daily. 30 g 2   Facility-Administered Medications Prior to Visit  Medication Dose Route Frequency Provider Last Rate Last Dose  . 0.9 %  sodium chloride infusion  500 mL Intravenous Continuous Nandigam, Kavitha V, MD        ROS: Review of Systems  Constitutional: Positive for fatigue. Negative for activity change, appetite change, chills and unexpected weight change.  HENT: Negative for congestion, mouth sores and sinus pressure.   Eyes: Negative for visual disturbance.  Respiratory: Negative for cough and chest tightness.   Gastrointestinal: Negative for abdominal pain and nausea.  Genitourinary: Negative for difficulty urinating, frequency and vaginal pain.  Musculoskeletal: Negative for back pain and gait problem.  Skin: Negative for pallor and rash.  Neurological: Positive for headaches. Negative for dizziness, tremors, weakness and numbness.  Psychiatric/Behavioral: Negative for confusion, sleep disturbance and suicidal ideas. The patient is nervous/anxious.     Objective:  BP 132/76 (BP Location: Left Arm, Patient Position: Sitting, Cuff Size: Normal)   Pulse 66    Temp 97.6 F (36.4 C) (Oral)   Ht 5\' 2"  (1.575 m)   Wt 189 lb (85.7 kg)   SpO2 96%   BMI 34.57 kg/m   BP Readings from Last 3 Encounters:  11/23/18 132/76  08/22/18 125/85  03/02/18 136/82    Wt Readings from Last 3 Encounters:  11/23/18 189 lb (85.7 kg)  08/22/18 189 lb (85.7 kg)  03/02/18 187 lb (84.8 kg)    Physical Exam Constitutional:      General: She is not in acute distress.    Appearance: She is well-developed.  HENT:     Head: Normocephalic.     Right Ear: External ear normal.     Left Ear: External ear normal.     Nose: Nose normal.  Eyes:     General:        Right eye: No discharge.        Left eye: No discharge.     Conjunctiva/sclera: Conjunctivae normal.     Pupils: Pupils are equal, round, and reactive to light.  Neck:     Musculoskeletal: Normal range of motion and neck supple.     Thyroid: No thyromegaly.     Vascular: No JVD.     Trachea: No tracheal deviation.  Cardiovascular:     Rate and Rhythm: Normal rate and regular rhythm.     Heart sounds: Normal heart sounds.  Pulmonary:     Effort: No respiratory distress.     Breath sounds: No stridor. No wheezing.  Abdominal:     General:  Bowel sounds are normal. There is no distension.     Palpations: Abdomen is soft. There is no mass.     Tenderness: There is no abdominal tenderness. There is no guarding or rebound.  Musculoskeletal:        General: No tenderness.  Lymphadenopathy:     Cervical: No cervical adenopathy.  Skin:    Findings: No erythema or rash.  Neurological:     Cranial Nerves: No cranial nerve deficit.     Motor: No abnormal muscle tone.     Coordination: Coordination normal.     Deep Tendon Reflexes: Reflexes normal.  Psychiatric:        Behavior: Behavior normal.        Thought Content: Thought content normal.        Judgment: Judgment normal.   obese  Lab Results  Component Value Date   WBC 8.5 11/24/2017   HGB 15.0 11/24/2017   HCT 44.6 11/24/2017   PLT  426.0 (H) 11/24/2017   GLUCOSE 88 11/24/2017   CHOL 207 (H) 11/24/2017   TRIG 125.0 11/24/2017   HDL 69.30 11/24/2017   LDLCALC 113 (H) 11/24/2017   ALT 23 11/24/2017   AST 18 11/24/2017   NA 138 11/24/2017   K 4.1 11/24/2017   CL 104 11/24/2017   CREATININE 0.69 11/24/2017   BUN 13 11/24/2017   CO2 27 11/24/2017   TSH 0.96 11/24/2017    Mm Diag Breast Tomo Uni Right  Result Date: 10/30/2016 CLINICAL DATA:  Screening recall for asymmetry seen in the right breast on the MLO view only. EXAM: 2D DIGITAL DIAGNOSTIC UNILATERAL RIGHT MAMMOGRAM WITH CAD AND ADJUNCT TOMO COMPARISON:  Previous exam(s). ACR Breast Density Category b: There are scattered areas of fibroglandular density. FINDINGS: Cc and MLO tomograms were performed of the right breast. The initially questioned possible right breast asymmetry resolves on the additional imaging with findings compatible with overlapping fibroglandular tissue. There is no mammographic evidence of malignancy in the right breast. Mammographic images were processed with CAD. IMPRESSION: No mammographic evidence of malignancy in the right breast. RECOMMENDATION: Screening mammogram in one year.(Code:SM-B-01Y) I have discussed the findings and recommendations with the patient. Results were also provided in writing at the conclusion of the visit. If applicable, a reminder letter will be sent to the patient regarding the next appointment. BI-RADS CATEGORY  1: Negative. Electronically Signed   By: Everlean Alstrom M.D.   On: 10/30/2016 12:16    Assessment & Plan:   There are no diagnoses linked to this encounter.   No orders of the defined types were placed in this encounter.    Follow-up: No follow-ups on file.  Walker Kehr, MD

## 2018-11-23 NOTE — Assessment & Plan Note (Signed)
Heart cath 2006 - normal (Dr Claiborne Billings)

## 2018-11-23 NOTE — Assessment & Plan Note (Signed)
Treat HTN

## 2018-11-27 ENCOUNTER — Other Ambulatory Visit: Payer: Self-pay | Admitting: Internal Medicine

## 2019-01-06 ENCOUNTER — Telehealth: Payer: Self-pay | Admitting: Internal Medicine

## 2019-01-06 NOTE — Telephone Encounter (Signed)
Patient states her blood pressure yesterday was 672 systolic, but today is 091'Z systolic---she absolutely refuses to see/talk with anyone other than Dr. Alain Marion, he is out of office today---I have advised patient I feel this could be neurologically related, which could suggest possible stroke, and the appropriate tx right now would be going to ED, possible CT scan to determine problem---I dont feel this is sports medicine related--patient did not fall, just started happening with no known cause---pt strongly advised again to go to ED for evaluation, we will keep appt with dr plotnikov for Monday, but should go to ED if any worsening symptoms

## 2019-01-06 NOTE — Telephone Encounter (Signed)
Patient states she is having left side pain and is having trouble moving the left side of her body.  Patient states she does not know if this is effecting her face or not.  She only wants to see Dr. Camila Li.  I have scheduled for Monday.  I have suggested the patient to go to the ED or Urgent Care.  Patient states she is refusing to do this.  States if she feels she is getting worse she will have her husband take her to the ED but until then she wanted to wait for an appointment with Dr. Camila Li.   Please follow back up to triage.

## 2019-01-09 ENCOUNTER — Ambulatory Visit (INDEPENDENT_AMBULATORY_CARE_PROVIDER_SITE_OTHER)
Admission: RE | Admit: 2019-01-09 | Discharge: 2019-01-09 | Disposition: A | Discharge status: Home / Self Care | Payer: PPO | Source: Ambulatory Visit | Attending: Internal Medicine | Admitting: Internal Medicine

## 2019-01-09 ENCOUNTER — Other Ambulatory Visit: Payer: Self-pay

## 2019-01-09 ENCOUNTER — Encounter: Payer: Self-pay | Admitting: Internal Medicine

## 2019-01-09 ENCOUNTER — Ambulatory Visit (INDEPENDENT_AMBULATORY_CARE_PROVIDER_SITE_OTHER): Payer: PPO | Admitting: Internal Medicine

## 2019-01-09 DIAGNOSIS — R21 Rash and other nonspecific skin eruption: Secondary | ICD-10-CM | POA: Diagnosis not present

## 2019-01-09 DIAGNOSIS — M50222 Other cervical disc displacement at C5-C6 level: Secondary | ICD-10-CM | POA: Diagnosis not present

## 2019-01-09 DIAGNOSIS — M50223 Other cervical disc displacement at C6-C7 level: Secondary | ICD-10-CM | POA: Diagnosis not present

## 2019-01-09 DIAGNOSIS — M79602 Pain in left arm: Secondary | ICD-10-CM | POA: Insufficient documentation

## 2019-01-09 DIAGNOSIS — M25512 Pain in left shoulder: Secondary | ICD-10-CM

## 2019-01-09 DIAGNOSIS — I10 Essential (primary) hypertension: Secondary | ICD-10-CM

## 2019-01-09 MED ORDER — DICLOFENAC SODIUM 75 MG PO TBEC: 75.0000 mg | DELAYED_RELEASE_TABLET | Freq: Two times a day (BID) | ORAL | 1 refills | Status: DC

## 2019-01-09 MED ORDER — TRIAMCINOLONE ACETONIDE 0.025 % EX OINT: TOPICAL_OINTMENT | Freq: Two times a day (BID) | CUTANEOUS | 2 refills | Status: DC

## 2019-01-09 NOTE — Progress Notes (Signed)
Virtual Visit via Telephone Note  I connected with Denise Donaldson on 01/09/19 at  2:20 PM EDT by telephone and verified that I am speaking with the correct person using two identifiers.   I discussed the limitations, risks, security and privacy concerns of performing an evaluation and management service by telephone and the availability of in person appointments. I also discussed with the patient that there may be a patient responsible charge related to this service. The patient expressed understanding and agreed to proceed.   History of Present Illness:   The patient is complaining of severe pain in the left shoulder anterior, superior, posterior aspects with range of motion.  The pain radiates down to the lower arm and hands.  She describes some weakness in her grip as well.  There is no neck pain.  No injury Follow-up hypertension: meds seem to work well. Complains of some rashes, ie contact dermatitis Observations/Objective:  The patient is in no acute distress.  She is overweight.  Left shoulder movement is decreased due to pain.  No visible muscle atrophy.  No rash on the arm.  Assessment and Plan: See plan  Follow Up Instructions:    I discussed the assessment and treatment plan with the patient. The patient was provided an opportunity to ask questions and all were answered. The patient agreed with the plan and demonstrated an understanding of the instructions.   The patient was advised to call back or seek an in-person evaluation if the symptoms worsen or if the condition fails to improve as anticipated.  I provided 20 minutes of non-face-to-face time during this encounter.   Walker Kehr, MD

## 2019-01-09 NOTE — Assessment & Plan Note (Signed)
L ?radiculopathy vs rot cuff Diclofenac. Pt refused steroids ROM exercise X rays

## 2019-01-09 NOTE — Assessment & Plan Note (Signed)
Triamc Rx

## 2019-01-09 NOTE — Assessment & Plan Note (Signed)
BP OK at home Bystolic, amlodipine

## 2019-01-23 ENCOUNTER — Ambulatory Visit (INDEPENDENT_AMBULATORY_CARE_PROVIDER_SITE_OTHER): Payer: PPO | Admitting: Internal Medicine

## 2019-01-23 ENCOUNTER — Encounter: Payer: Self-pay | Admitting: Internal Medicine

## 2019-01-23 DIAGNOSIS — I10 Essential (primary) hypertension: Secondary | ICD-10-CM | POA: Diagnosis not present

## 2019-01-23 DIAGNOSIS — M5412 Radiculopathy, cervical region: Secondary | ICD-10-CM

## 2019-01-23 DIAGNOSIS — M25512 Pain in left shoulder: Secondary | ICD-10-CM | POA: Diagnosis not present

## 2019-01-23 MED ORDER — MELOXICAM 15 MG PO TABS: 15.0000 mg | ORAL_TABLET | Freq: Every day | ORAL | 3 refills | Status: DC

## 2019-01-23 NOTE — Assessment & Plan Note (Signed)
SBP 124- 140

## 2019-01-23 NOTE — Assessment & Plan Note (Signed)
Not better on NSAID, may be 10% better Refusing po steroids, Sports Med consult MRI C spine

## 2019-01-23 NOTE — Progress Notes (Signed)
Virtual Visit via Video Note  I connected with Denise Donaldson on 01/23/19 at  1:40 PM EDT by a video enabled telemedicine application and verified that I am speaking with the correct person using two identifiers.   I discussed the limitations of evaluation and management by telemedicine and the availability of in person appointments. The patient expressed understanding and agreed to proceed.  History of Present Illness: The patient continues to have pain in the left shoulder going down the arm and hand.  There is a problem with weakness in the left arm.  The problem may have gotten 10% better on Voltaren p.o. she cannot hold anything for a prolonged period of time, because her arm below the left elbow gets weak.  She has been checking her heart rate.  The heart rate varies between 80 and 90.  She is complaining of some headaches.  Blood pressure has been fairly normal.  No chest pain, no syncope, no nausea vomiting   Observations/Objective:  The patient is in no acute distress.  There is no visible muscle atrophy.  Shoulder range of motion is decreased due to pain.  Neck range of motion is full Assessment and Plan:  See plan Follow Up Instructions:    I discussed the assessment and treatment plan with the patient. The patient was provided an opportunity to ask questions and all were answered. The patient agreed with the plan and demonstrated an understanding of the instructions.   The patient was advised to call back or seek an in-person evaluation if the symptoms worsen or if the condition fails to improve as anticipated.  I provided 25 minutes of non-face-to-face time during this encounter.   Walker Kehr, MD

## 2019-01-23 NOTE — Assessment & Plan Note (Signed)
Not better on NSAID, may be 10% better Refusing po steroids, Sports Med consult MRI

## 2019-02-02 ENCOUNTER — Other Ambulatory Visit: Payer: Self-pay

## 2019-02-02 ENCOUNTER — Ambulatory Visit
Admission: RE | Admit: 2019-02-02 | Discharge: 2019-02-02 | Disposition: A | Discharge status: Home / Self Care | Payer: PPO | Source: Ambulatory Visit | Attending: Internal Medicine | Admitting: Internal Medicine

## 2019-02-02 DIAGNOSIS — M5412 Radiculopathy, cervical region: Secondary | ICD-10-CM

## 2019-02-02 DIAGNOSIS — M4802 Spinal stenosis, cervical region: Secondary | ICD-10-CM | POA: Diagnosis not present

## 2019-02-07 ENCOUNTER — Other Ambulatory Visit: Payer: Self-pay | Admitting: Internal Medicine

## 2019-02-07 DIAGNOSIS — M5412 Radiculopathy, cervical region: Secondary | ICD-10-CM

## 2019-02-07 NOTE — Progress Notes (Signed)
neuros ref

## 2019-02-22 ENCOUNTER — Ambulatory Visit (INDEPENDENT_AMBULATORY_CARE_PROVIDER_SITE_OTHER): Payer: PPO | Admitting: Internal Medicine

## 2019-02-22 DIAGNOSIS — R202 Paresthesia of skin: Secondary | ICD-10-CM | POA: Diagnosis not present

## 2019-02-22 DIAGNOSIS — I10 Essential (primary) hypertension: Secondary | ICD-10-CM | POA: Diagnosis not present

## 2019-02-22 DIAGNOSIS — M5412 Radiculopathy, cervical region: Secondary | ICD-10-CM | POA: Diagnosis not present

## 2019-02-22 DIAGNOSIS — K2100 Gastro-esophageal reflux disease with esophagitis, without bleeding: Secondary | ICD-10-CM

## 2019-02-22 NOTE — Progress Notes (Addendum)
   Subjective:  Patient ID: Denise Donaldson Current, female    DOB: [birth year]  Age: 63 y.o. MRN: [id number]  Virtual Visit via Video Note  I connected with Kellin Plantz on 04/04/19 at  2:00 PM EDT by a video enabled telemedicine application and verified that I am speaking with the correct person using two identifiers.   I discussed the limitations of evaluation and management by telemedicine and the availability of in person appointments. The patient expressed understanding and agreed to proceed.  History of Present Illness:  Jamesyn Heffington presents for cervical radiculopathy L arm pain worse,  HTN, GERD C/o R hand fingers #3-5 numbness; knitting makes it better  It is a virtual visit due to [place] situation. The pt identity confirmed   There has been no runny nose, cough, chest pain, shortness of breath, abdominal pain, diarrhea, constipation, arthralgias, skin rashes.   Observations/Objective: The patient appears to be in no acute distress, looks well.  Assessment and Plan:  See my Assessment and Plan. Follow Up Instructions:    I discussed the assessment and treatment plan with the patient. The patient was provided an opportunity to ask questions and all were answered. The patient agreed with the plan and demonstrated an understanding of the instructions.   The patient was advised to call back or seek an in-person evaluation if the symptoms worsen or if the condition fails to improve as anticipated.  I provided face-to-face time during this encounter. We were at different locations.   Walker Kehr, MD

## 2019-02-23 ENCOUNTER — Encounter: Payer: Self-pay | Admitting: Internal Medicine

## 2019-02-23 ENCOUNTER — Ambulatory Visit: Payer: PPO | Admitting: Internal Medicine

## 2019-02-23 DIAGNOSIS — R202 Paresthesia of skin: Secondary | ICD-10-CM | POA: Insufficient documentation

## 2019-02-23 MED ORDER — B COMPLEX PO TABS: 1.0000 | ORAL_TABLET | Freq: Every day | ORAL | 3 refills | Status: AC

## 2019-02-23 MED ORDER — GABAPENTIN 100 MG PO CAPS: 100.0000 mg | ORAL_CAPSULE | Freq: Three times a day (TID) | ORAL | 3 refills | Status: DC

## 2019-02-23 NOTE — Assessment & Plan Note (Signed)
Continue with Protonix.  Gluten-free diet

## 2019-02-23 NOTE — Assessment & Plan Note (Signed)
Continue with amlodipine, low-dose.  Diastolic

## 2019-02-23 NOTE — Assessment & Plan Note (Signed)
Left side.  Not better.  Start PT.  Neurosurgical consultation

## 2019-02-23 NOTE — Assessment & Plan Note (Signed)
Left hand in the ulnar nerve distribution numbness.  Refer to PT

## 2019-03-06 ENCOUNTER — Ambulatory Visit: Payer: PPO | Attending: Internal Medicine | Admitting: Physical Therapy

## 2019-03-06 ENCOUNTER — Encounter: Payer: Self-pay | Admitting: Physical Therapy

## 2019-03-06 ENCOUNTER — Other Ambulatory Visit: Payer: Self-pay

## 2019-03-06 DIAGNOSIS — G8929 Other chronic pain: Secondary | ICD-10-CM | POA: Insufficient documentation

## 2019-03-06 DIAGNOSIS — M25612 Stiffness of left shoulder, not elsewhere classified: Secondary | ICD-10-CM

## 2019-03-06 DIAGNOSIS — M25512 Pain in left shoulder: Secondary | ICD-10-CM | POA: Insufficient documentation

## 2019-03-06 DIAGNOSIS — M542 Cervicalgia: Secondary | ICD-10-CM

## 2019-03-06 DIAGNOSIS — M6281 Muscle weakness (generalized): Secondary | ICD-10-CM

## 2019-03-06 NOTE — Therapy (Signed)
[place], [place], [address] Phone: [phone]   Fax:  [phone]  Physical Therapy Evaluation  Patient Details  Name: Denise Donaldson MRN: [id number] Date of Birth: 1956/07/14 Referring Provider (PT): Plotnikov, Evie Lacks, MD   Encounter Date: 03/06/2019  PT End of Session - 03/06/19 1351    Visit Number  1    Number of Visits  13    Date for PT Re-Evaluation  04/17/19    Authorization Type  [employer]: Kx mod by 15th visit, progress note at 10 visit    PT Start Time  1352    PT Stop Time  1438    PT Time Calculation (min)  46 min    Activity Tolerance  Patient tolerated treatment well;Patient limited by pain    Behavior During Therapy  [facility] for tasks assessed/performed       Past Medical History:  Diagnosis Date  . Costochondral chest pain 2005   Left  . Diverticulosis   . Foot fracture, right    Dr Louanne Skye  . GERD (gastroesophageal reflux disease)    Dr Olevia Perches, EGD 2011 WNL  . Helicobacter pylori gastritis   . History of pneumonia   . HTN (hypertension)   . Hyperplastic colon polyp   . Menopausal disorder    After 40 Dr Corinna Capra  . Obesity   . Pectus excavatum   . Venous insufficiency    Bilat LE    Past Surgical History:  Procedure Laterality Date  . APPENDECTOMY    . BREAST BIOPSY     left breast excisional biopsy 1980  . CARDIAC CATHETERIZATION  2006   had double vision past cath  . CESAREAN SECTION     1 time  . COLONOSCOPY  04/27/2012   Procedure: COLONOSCOPY;  Surgeon: Lafayette Dragon, MD;  Location: [facility];  Service: Endoscopy;  Laterality: N/A;  . double vision  2006   past cardiac cath/ lasted 9 months and went away  . ESOPHAGOGASTRODUODENOSCOPY  04/27/2012   Procedure: ESOPHAGOGASTRODUODENOSCOPY (EGD);  Surgeon: Lafayette Dragon, MD;  Location: [facility];  Service: Endoscopy;  Laterality: N/A;  . OVARIAN CYST REMOVAL      There were no vitals filed for  this visit.   Subjective Assessment - 03/06/19 1357    Subjective  pt is a 63 y.o F with CC of report of referred pain going down the L shoulder that began back in January which started with no specific onset. pt repots pain and N/T that goes down to the L wrist/ hand. She reports pain is constant symptoms down the LUE. she denies HA, or any other issues. she notes N/T in the R wrist and hand but only along the 3rd-5th digits.    Currently in Pain?  Yes    Pain Score  7    at worst 10/10   Pain Location  Neck    Pain Orientation  Left    Pain Descriptors / Indicators  Aching;Sharp;Shooting    Pain Type  Chronic pain    Pain Radiating Towards  down LUE to the wrist and hand    Pain Onset  More than a month ago    Pain Frequency  Intermittent    Aggravating Factors   looking to the L and moving the L arm    Pain Relieving Factors  rest, avoid using the LUE    Effect of Pain on Daily Activities  Limited UE use  Multiple Pain Sites  Yes    Pain Score  0    Pain Location  Hand    Pain Orientation  Right    Pain Descriptors / Indicators  Numbness    Pain Type  Chronic pain    Pain Onset  More than a month ago    Pain Frequency  Intermittent    Aggravating Factors   unsure    Pain Relieving Factors  unsure         [facility] PT Assessment - 03/06/19 1354      Assessment   Medical Diagnosis   Cervical radiculitis, Paresthesia    potential shoulder involvement, R unlar tunnel   Referring Provider (PT)  Plotnikov, Evie Lacks, MD    Onset Date/Surgical Date  --   January 2020   Hand Dominance  Left    Next MD Visit  unsure    Prior Therapy  yes   for the knee     Precautions   Precautions  None      Restrictions   Weight Bearing Restrictions  No      Balance Screen   Has the patient fallen in the past 6 months  No    Has the patient had a decrease in activity level because of a fear of falling?   No    Is the patient reluctant to leave their home because of a fear of falling?    No      Home Film/video editor residence    Living Arrangements  Spouse/significant other    Available Help at Discharge  Family    Type of Wanship to enter    Entrance Stairs-Number of Steps  6    Entrance Stairs-Rails  None    Home Layout  Multi-level    Alternate Level Stairs-Number of Steps  7    Alternate Level Stairs-Rails  None      Prior Function   Level of Independence  Independent with basic ADLs    Vocation  Retired      Associate Professor   Overall Cognitive Status  Within Functional Limits for tasks assessed      Observation/Other Assessments   Focus on Therapeutic Outcomes (FOTO)   46% limited   predited 34% limite     Posture/Postural Control   Posture/Postural Control  Postural limitations    Postural Limitations  Rounded Shoulders;Forward head      ROM / Strength   AROM / PROM / Strength  AROM;Strength;PROM      AROM   AROM Assessment Site  Cervical;Shoulder    Right/Left Shoulder  Right;Left    Right Shoulder Extension  60 Degrees    Right Shoulder Flexion  159 Degrees    Right Shoulder ABduction  160 Degrees    Right Shoulder Internal Rotation  --   T7   Right Shoulder External Rotation  --   T1   Left Shoulder Extension  19 Degrees   reproduction of concordant pain   Left Shoulder Flexion  100 Degrees   reproduction of concordant pain   Left Shoulder ABduction  65 Degrees   reproduction of concordant pain   Left Shoulder Internal Rotation  64 Degrees   assessed in neutral    Left Shoulder External Rotation  21 Degrees   assessed in neutral    Cervical Flexion  58    Cervical Extension  60    Cervical -  Right Side Bend  50    Cervical - Left Side Bend  52    Cervical - Right Rotation  82    Cervical - Left Rotation  77      PROM   PROM Assessment Site  Shoulder    Right/Left Shoulder  Left    Left Shoulder Flexion  115 Degrees      Strength   Overall Strength Comments  L pec strength no pain  3-/5    Strength Assessment Site  Shoulder;Hand    Right/Left Shoulder  Right;Left    Right Shoulder Flexion  4+/5    Right Shoulder Extension  4+/5    Right Shoulder ABduction  4+/5    Right Shoulder Internal Rotation  4+/5    Right Shoulder External Rotation  4+/5    Left Shoulder Flexion  3-/5    Left Shoulder Extension  3-/5    Left Shoulder ABduction  3-/5    Left Shoulder Internal Rotation  2/5   significant pain during testing   Left Shoulder External Rotation  3+/5   general weakns   Right Hand Grip (lbs)  63   58,69,62   Left Hand Grip (lbs)  48   50, 56,40     Palpation   Palpation comment  TTP along the L uppe trap/ levator scapulae and scalenes. significant tenderness along the lesser tubercle      Special Tests    Special Tests  Cervical    Cervical Tests  Spurling's;Dictraction;other      Spurling's   Findings  Negative      Distraction Test   Findngs  Negative      other    Findings  Negative    Comment  ULTT                Objective measurements completed on examination: See above findings.              PT Education - 03/06/19 1351    Education Details  evaluation findings, POC, goals, HEP with proper form/ rationale. muscle anatomy of area involved    Person(s) Educated  Patient    Methods  Explanation;Verbal cues    Comprehension  Verbalized understanding;Verbal cues required       PT Short Term Goals - 03/06/19 1455      PT SHORT TERM GOAL #1   Title  pt to be I with inital HEP    Time  3    Period  Weeks    Status  New    Target Date  03/27/19      PT SHORT TERM GOAL #2   Title  pt to verbalize and demo proper posture and lifting mechanics to reduce and prevent neck/ shoulder pain    Time  3    Period  Weeks    Status  New    Target Date  03/27/19      PT SHORT TERM GOAL #3   Title  pt to increase L grip strength by >/= 10# to demo improving shoulder function    Time  3    Period  Weeks    Status  New     Target Date  03/27/19        PT Long Term Goals - 03/06/19 1456      PT LONG TERM GOAL #1   Title  pt to increase L shoulder ROM grossly to [facility] compared bil with </= 2/10 pain for functional mobility  Time  6    Period  Weeks    Status  New    Target Date  04/17/19      PT LONG TERM GOAL #2   Title  pt to increase L shoulder strength to >/=4/5 in all planes to promote shoulder stability with ADLs    Time  6    Period  Weeks    Status  New    Target Date  04/17/19      PT LONG TERM GOAL #3   Title  pt to be able to lift and lower >/=10# to and from an overhead shelf and push/pull ./= 15# to for functional lifting    Time  6    Period  Weeks    Status  New    Target Date  04/17/19      PT LONG TERM GOAL #4   Title  increase FOTO score to </= 34% limited to demo improvmeent in function    Time  6    Period  Weeks    Status  New    Target Date  04/17/19      PT LONG TERM GOAL #5   Title  pt to be I with all HEP given as of last visit to maintain and progress current level of function    Time  6    Period  Weeks    Status  New    Target Date  04/17/19             Plan - 03/06/19 1448    Clinical Impression Statement  pt presents to OPPT with Dx of cervicalgia and referral of LUE and R ulnar tunnel. cervical mobility is [facility] with no reproduction of concordant pain and was negative for all cerivcal special testing. L shoulder ROM/ strength  demonstrates significant limitations compared bil secondary to guarding and pain with TTP along lesser tubercle and reproduciton of pain with resisted internal rotation suggesting potential rotator cuff involvement. she would benefit from physical therapy to decrease L shoulder pain, improve mobility and strength and maximize overall function by addressing the deficits listed.    Personal Factors and Comorbidities  Age;Comorbidity 1;Other   Other: english is a second language   Comorbidities  hx of HTN    Examination-Activity  Limitations  Lift;Dressing    Stability/Clinical Decision Making  Evolving/Moderate complexity    Clinical Decision Making  Moderate    Rehab Potential  Good    PT Frequency  2x / week    PT Duration  6 weeks    PT Treatment/Interventions  ADLs/Self Care Home Management;Cryotherapy;Iontophoresis 4mg /ml Dexamethasone;Electrical Stimulation;Moist Heat;Traction;Ultrasound;Therapeutic activities;Therapeutic exercise;Neuromuscular re-education;Manual techniques;Passive range of motion;Dry needling;Taping    PT Next Visit Plan  review/ update HEP, shoulder PROM>AAROM, scapular stability, STW along pec major/ minor, sub-scapularis, and upper trap. modalities for pain    PT Home Exercise Plan  shoulder flexion/ abduction table slides, scapular retraction, upper trap stretch    Consulted and Agree with Plan of Care  Patient       Patient will benefit from skilled therapeutic intervention in order to improve the following deficits and impairments:  Decreased strength, Decreased activity tolerance, Pain, Impaired UE functional use, Postural dysfunction, Improper body mechanics, Decreased endurance, Increased fascial restricitons  Visit Diagnosis: Chronic left shoulder pain  Cervicalgia  Muscle weakness (generalized)  Stiffness of left shoulder, not elsewhere classified     Problem List Patient Active Problem List   Diagnosis Date Noted  . Paresthesia 02/23/2019  .  Cervical radiculitis 01/23/2019  . Arm pain, anterior, left 01/09/2019  . Shoulder pain, left 01/09/2019  . Headache 08/22/2018  . Vitamin D deficiency 03/02/2018  . Taste sense altered 11/24/2017  . Abdominal pain 08/30/2017  . Red eye 12/15/2016  . Chest pain, atypical 06/01/2016  . Left knee pain 06/19/2013  . Lower leg pain 05/12/2013  . Right hip pain 07/15/2012  . Viral warts 07/15/2012  . LBP (low back pain) 07/15/2012  . Barrett's esophagus 04/27/2012  . Trigeminal neuralgia 02/26/2012  . Colon polyp 02/26/2012   . Nail dystrophy 05/11/2011  . Neoplasm of uncertain behavior of skin 05/11/2011  . History of double vision 05/11/2011  . Wart viral 02/04/2011  . UPPER RESPIRATORY INFECTION, ACUTE 09/23/2010  . Rash 09/23/2010  . COUGH 09/23/2010  . Edema 05/05/2010  . GERD 01/15/2010  . Essential hypertension 05/06/2009  . DIVERTICULOSIS, COLON 05/06/2009  . COSTOCHONDRITIS, RECURRENT 05/06/2009  . CHEST WALL PAIN 05/06/2009  . PNEUMONIA, HX OF 05/06/2009  . COLONIC POLYPS, HX OF 05/06/2009   Denise Donaldson PT, DPT, LAT, ATC  03/06/19  2:59 PM      [facility] [address] [place], [place], [address] Phone: [phone]   Fax:  [phone]  Name: Denise Donaldson MRN: [id number] Date of Birth: 01/23/1956

## 2019-03-15 ENCOUNTER — Ambulatory Visit: Payer: PPO | Admitting: Physical Therapy

## 2019-03-15 ENCOUNTER — Other Ambulatory Visit: Payer: Self-pay

## 2019-03-15 ENCOUNTER — Encounter: Payer: Self-pay | Admitting: Physical Therapy

## 2019-03-15 DIAGNOSIS — M25512 Pain in left shoulder: Secondary | ICD-10-CM | POA: Diagnosis not present

## 2019-03-15 DIAGNOSIS — M6281 Muscle weakness (generalized): Secondary | ICD-10-CM

## 2019-03-15 DIAGNOSIS — M542 Cervicalgia: Secondary | ICD-10-CM

## 2019-03-15 DIAGNOSIS — M25612 Stiffness of left shoulder, not elsewhere classified: Secondary | ICD-10-CM

## 2019-03-15 DIAGNOSIS — G8929 Other chronic pain: Secondary | ICD-10-CM

## 2019-03-15 NOTE — Therapy (Signed)
[place], [place], [address] Phone: [phone]   Fax:  [phone]  Physical Therapy Treatment  Patient Details  Name: Denise Donaldson MRN: [id number] Date of Birth: Mar 04, 1956 Referring Provider (PT): Plotnikov, Evie Lacks, MD   Encounter Date: 03/15/2019  PT End of Session - 03/15/19 1334    Visit Number  2    Number of Visits  13    Date for PT Re-Evaluation  04/17/19    PT Start Time  1333    PT Stop Time  1411    PT Time Calculation (min)  38 min    Activity Tolerance  Patient tolerated treatment well    Behavior During Therapy  [facility] for tasks assessed/performed       Past Medical History:  Diagnosis Date  . Costochondral chest pain 2005   Left  . Diverticulosis   . Foot fracture, right    Dr Louanne Skye  . GERD (gastroesophageal reflux disease)    Dr Olevia Perches, EGD 2011 WNL  . Helicobacter pylori gastritis   . History of pneumonia   . HTN (hypertension)   . Hyperplastic colon polyp   . Menopausal disorder    After 53 Dr Corinna Capra  . Obesity   . Pectus excavatum   . Venous insufficiency    Bilat LE    Past Surgical History:  Procedure Laterality Date  . APPENDECTOMY    . BREAST BIOPSY     left breast excisional biopsy 1980  . CARDIAC CATHETERIZATION  2006   had double vision past cath  . CESAREAN SECTION     1 time  . COLONOSCOPY  04/27/2012   Procedure: COLONOSCOPY;  Surgeon: Lafayette Dragon, MD;  Location: [facility];  Service: Endoscopy;  Laterality: N/A;  . double vision  2006   past cardiac cath/ lasted 9 months and went away  . ESOPHAGOGASTRODUODENOSCOPY  04/27/2012   Procedure: ESOPHAGOGASTRODUODENOSCOPY (EGD);  Surgeon: Lafayette Dragon, MD;  Location: [facility];  Service: Endoscopy;  Laterality: N/A;  . OVARIAN CYST REMOVAL      There were no vitals filed for this visit.  Subjective Assessment - 03/15/19 1334    Subjective  "I think I am doing alittle better, I am trying to do  all exercise"    Currently in Pain?  Yes    Pain Score  6     Pain Location  Shoulder    Pain Orientation  Left    Pain Descriptors / Indicators  Aching    Pain Type  Chronic pain    Pain Onset  More than a month ago    Pain Frequency  Intermittent    Aggravating Factors   gripping with the L hand,    Pain Relieving Factors  rest, HEP                       [facility] Adult PT Treatment/Exercise - 03/15/19 0001      Shoulder Exercises: Supine   Flexion  10 reps;AAROM   with dowel rod   ABduction  10 reps;AAROM   using dowel rod     Shoulder Exercises: Standing   Flexion  AAROM;Left;10 reps   with dowel rod   ABduction  AAROM;Left;10 reps   with dowel rod     Shoulder Exercises: Isometric Strengthening   Flexion  5X10"   x 2   Extension  5X10"   x 2   External Rotation  5X10"  x 2   Internal Rotation  5X10"   x 2     Manual Therapy   Manual Therapy  Soft tissue mobilization;Joint mobilization;Passive ROM;Scapular mobilization    Manual therapy comments  MTPR along bicep brachii, and middle deltoid    Scapular Mobilization  grade III in all planes except elevation    Passive ROM  working into end range with gentle intermittent distraction               PT Short Term Goals - 03/06/19 1455      PT SHORT TERM GOAL #1   Title  pt to be I with inital HEP    Time  3    Period  Weeks    Status  New    Target Date  03/27/19      PT SHORT TERM GOAL #2   Title  pt to verbalize and demo proper posture and lifting mechanics to reduce and prevent neck/ shoulder pain    Time  3    Period  Weeks    Status  New    Target Date  03/27/19      PT SHORT TERM GOAL #3   Title  pt to increase L grip strength by >/= 10# to demo improving shoulder function    Time  3    Period  Weeks    Status  New    Target Date  03/27/19        PT Long Term Goals - 03/06/19 1456      PT LONG TERM GOAL #1   Title  pt to increase L shoulder ROM grossly to [facility] compared  bil with </= 2/10 pain for functional mobility    Time  6    Period  Weeks    Status  New    Target Date  04/17/19      PT LONG TERM GOAL #2   Title  pt to increase L shoulder strength to >/=4/5 in all planes to promote shoulder stability with ADLs    Time  6    Period  Weeks    Status  New    Target Date  04/17/19      PT LONG TERM GOAL #3   Title  pt to be able to lift and lower >/=10# to and from an overhead shelf and push/pull ./= 15# to for functional lifting    Time  6    Period  Weeks    Status  New    Target Date  04/17/19      PT LONG TERM GOAL #4   Title  increase FOTO score to </= 34% limited to demo improvmeent in function    Time  6    Period  Weeks    Status  New    Target Date  04/17/19      PT LONG TERM GOAL #5   Title  pt to be I with all HEP given as of last visit to maintain and progress current level of function    Time  6    Period  Weeks    Status  New    Target Date  04/17/19            Plan - 03/15/19 1408    Clinical Impression Statement  pt reports mild reduction in pain since previous session and notes being consistent with her HEP. continued working shoulder AAROM and strengthening. she did well with ROM noting soreness at end range  flexion/ abduction. she noted mild soreness with isometrics but reported reduced pain at end of session.    PT Treatment/Interventions  ADLs/Self Care Home Management;Cryotherapy;Iontophoresis 4mg /ml Dexamethasone;Electrical Stimulation;Moist Heat;Traction;Ultrasound;Therapeutic activities;Therapeutic exercise;Neuromuscular re-education;Manual techniques;Passive range of motion;Dry needling;Taping    PT Next Visit Plan  shoulder PROM>AAROM, scapular stability, STW along pec major/ minor, sub-scapularis, and upper trap. modalities for pain    PT Home Exercise Plan  shoulder flexion/ abduction table slides, scapular retraction, upper trap stretch    Consulted and Agree with Plan of Care  Patient       Patient  will benefit from skilled therapeutic intervention in order to improve the following deficits and impairments:  Decreased strength, Decreased activity tolerance, Pain, Impaired UE functional use, Postural dysfunction, Improper body mechanics, Decreased endurance, Increased fascial restricitons  Visit Diagnosis: 1. Chronic left shoulder pain   2. Cervicalgia   3. Muscle weakness (generalized)   4. Stiffness of left shoulder, not elsewhere classified        Problem List Patient Active Problem List   Diagnosis Date Noted  . Paresthesia 02/23/2019  . Cervical radiculitis 01/23/2019  . Arm pain, anterior, left 01/09/2019  . Shoulder pain, left 01/09/2019  . Headache 08/22/2018  . Vitamin D deficiency 03/02/2018  . Taste sense altered 11/24/2017  . Abdominal pain 08/30/2017  . Red eye 12/15/2016  . Chest pain, atypical 06/01/2016  . Left knee pain 06/19/2013  . Lower leg pain 05/12/2013  . Right hip pain 07/15/2012  . Viral warts 07/15/2012  . LBP (low back pain) 07/15/2012  . Barrett's esophagus 04/27/2012  . Trigeminal neuralgia 02/26/2012  . Colon polyp 02/26/2012  . Nail dystrophy 05/11/2011  . Neoplasm of uncertain behavior of skin 05/11/2011  . History of double vision 05/11/2011  . Wart viral 02/04/2011  . UPPER RESPIRATORY INFECTION, ACUTE 09/23/2010  . Rash 09/23/2010  . COUGH 09/23/2010  . Edema 05/05/2010  . GERD 01/15/2010  . Essential hypertension 05/06/2009  . DIVERTICULOSIS, COLON 05/06/2009  . COSTOCHONDRITIS, RECURRENT 05/06/2009  . CHEST WALL PAIN 05/06/2009  . PNEUMONIA, HX OF 05/06/2009  . COLONIC POLYPS, HX OF 05/06/2009   Starr Lake PT, DPT, LAT, ATC  03/15/19  2:11 PM      [facility] [address] [place], [place], [address] Phone: [phone]   Fax:  [phone]  Name: Denise Donaldson MRN: [id number] Date of Birth: [birth year]

## 2019-03-20 ENCOUNTER — Encounter: Payer: Self-pay | Admitting: Physical Therapy

## 2019-03-20 ENCOUNTER — Other Ambulatory Visit: Payer: Self-pay

## 2019-03-20 ENCOUNTER — Telehealth: Payer: Self-pay | Admitting: *Deleted

## 2019-03-20 ENCOUNTER — Ambulatory Visit: Payer: PPO | Admitting: Physical Therapy

## 2019-03-20 DIAGNOSIS — M25512 Pain in left shoulder: Secondary | ICD-10-CM

## 2019-03-20 DIAGNOSIS — M6281 Muscle weakness (generalized): Secondary | ICD-10-CM

## 2019-03-20 DIAGNOSIS — M542 Cervicalgia: Secondary | ICD-10-CM

## 2019-03-20 DIAGNOSIS — M25612 Stiffness of left shoulder, not elsewhere classified: Secondary | ICD-10-CM

## 2019-03-20 DIAGNOSIS — G8929 Other chronic pain: Secondary | ICD-10-CM

## 2019-03-20 NOTE — Telephone Encounter (Signed)
Script Screening patients for COVID-19 and reviewing new operational procedures  Greeting - The reason I am calling is to share with you some new changes to our processes that are designed to help us keep everyone safe. Is now a good time to speak with you? Patient says "no' - ask them when you can call back and let them know it's important to do this prior to their appointment.  Patient says "yes" - Great, Lucy the first thing I need to do is ask you some screening Questions.  1. To the best of your knowledge, have you been in close contact with any one with a confirmed diagnosis of COVID 19? o No - proceed to next question  2. Have you had any one or more of the following: fever, chills, cough, shortness of breath or any flu-like symptoms? o No - proceed to next question  3. Have you been diagnosed with or have a previous diagnosis of COVID 19? o No - proceed to next question  4. I am going to go over a few other symptoms with you. Please let me know if you are experiencing any of the following: . Ear, nose or throat discomfort . A sore throat . Headache . Muscle pain . Diarrhea . Loss of taste or smell o No - proceed to next question  Thank you for answering these questions. Please know we will ask you these questions or similar questions when you arrive for your appointment and again it's how we are keeping everyone safe. Also, to keep you safe, please use the provided hand sanitizer when you enter the building. Lucy, we are asking everyone in the building to wear a mask because they help us prevent the spread of germs. Do you have a mask of your own, if not, we are happy to provide one for you. The last thing I want to go over with you is the no visitor guidelines. This means no one can attend the appointment with you unless you need physical assistance. I understand this may be different from your past appointments and I know this may be difficult but please know if  someone is driving you we are happy to call them for you once your appointment is over.  [INSERT SITE SPECIFIC CHECK IN PROCEDURES]  Lucy I've given you a lot of information, what questions do you have about what I've talked about today or your appointment tomorrow? 

## 2019-03-20 NOTE — Therapy (Signed)
[place], [place], [address] Phone: [phone]   Fax:  [phone]  Physical Therapy Treatment  Patient Details  Name: Denise Donaldson MRN: [id number] Date of Birth: 11/14/1955 Referring Provider (PT): Plotnikov, Evie Lacks, MD   Encounter Date: 03/20/2019  PT End of Session - 03/20/19 1302    Visit Number  3    Number of Visits  13    Date for PT Re-Evaluation  04/17/19    Authorization Type  Health team advantage: Kx mod by 15th visit, progress note at 10 visit    PT Start Time  1301    PT Stop Time  1340    PT Time Calculation (min)  39 min    Activity Tolerance  Patient tolerated treatment well    Behavior During Therapy  [facility] for tasks assessed/performed       Past Medical History:  Diagnosis Date  . Costochondral chest pain 2005   Left  . Diverticulosis   . Foot fracture, right    Dr Louanne Skye  . GERD (gastroesophageal reflux disease)    Dr Olevia Perches, EGD 2011 WNL  . Helicobacter pylori gastritis   . History of pneumonia   . HTN (hypertension)   . Hyperplastic colon polyp   . Menopausal disorder    After 42 Dr Corinna Capra  . Obesity   . Pectus excavatum   . Venous insufficiency    Bilat LE    Past Surgical History:  Procedure Laterality Date  . APPENDECTOMY    . BREAST BIOPSY     left breast excisional biopsy 1980  . CARDIAC CATHETERIZATION  2006   had double vision past cath  . CESAREAN SECTION     1 time  . COLONOSCOPY  04/27/2012   Procedure: COLONOSCOPY;  Surgeon: Lafayette Dragon, MD;  Location: [facility];  Service: Endoscopy;  Laterality: N/A;  . double vision  2006   past cardiac cath/ lasted 9 months and went away  . ESOPHAGOGASTRODUODENOSCOPY  04/27/2012   Procedure: ESOPHAGOGASTRODUODENOSCOPY (EGD);  Surgeon: Lafayette Dragon, MD;  Location: [facility];  Service: Endoscopy;  Laterality: N/A;  . OVARIAN CYST REMOVAL      There were no vitals filed for this visit.  Subjective  Assessment - 03/20/19 1302    Subjective  " I only woke up once last night, I am able to reach out more to the side"    Currently in Pain?  Yes    Pain Score  5     Pain Orientation  Left    Pain Descriptors / Indicators  Aching    Pain Type  Chronic pain    Pain Radiating Towards  lifting arm to the outside    Pain Onset  More than a month ago    Pain Frequency  Intermittent                       [facility] Adult PT Treatment/Exercise - 03/20/19 0001      Shoulder Exercises: Seated   Other Seated Exercises  inferior shoulder glides 2 x 10    given as HEP     Shoulder Exercises: Sidelying   ABduction  Strengthening;Left;10 reps   x 2 sets     Shoulder Exercises: Standing   External Rotation  Strengthening;10 reps;Theraband    Theraband Level (Shoulder External Rotation)  Level 2 (Red)    Internal Rotation  Strengthening;Left;10 reps;Theraband    Theraband Level (Shoulder Internal Rotation)  Level 2 (Red)    Flexion  AAROM;Left;10 reps    ABduction  AAROM;Left;10 reps      Shoulder Exercises: Stretch   Other Shoulder Stretches  upper trap stretch 2 x 30      Manual Therapy   Manual Therapy  Scapular mobilization;Joint mobilization    Joint Mobilization  GHJ inferior and AP/PA mobs grade III    Scapular Mobilization  grade III scapular upward assist   performed during sidelying shoulder abduction   Passive ROM  focusing on abduction working into end range             PT Education - 03/20/19 1341    Education Details  updated HEP for shoulder strengthening,    Person(s) Educated  Patient    Methods  Explanation;Verbal cues;Handout    Comprehension  Verbalized understanding;Verbal cues required       PT Short Term Goals - 03/06/19 1455      PT SHORT TERM GOAL #1   Title  pt to be I with inital HEP    Time  3    Period  Weeks    Status  New    Target Date  03/27/19      PT SHORT TERM GOAL #2   Title  pt to verbalize and demo proper posture and  lifting mechanics to reduce and prevent neck/ shoulder pain    Time  3    Period  Weeks    Status  New    Target Date  03/27/19      PT SHORT TERM GOAL #3   Title  pt to increase L grip strength by >/= 10# to demo improving shoulder function    Time  3    Period  Weeks    Status  New    Target Date  03/27/19        PT Long Term Goals - 03/06/19 1456      PT LONG TERM GOAL #1   Title  pt to increase L shoulder ROM grossly to [facility] compared bil with </= 2/10 pain for functional mobility    Time  6    Period  Weeks    Status  New    Target Date  04/17/19      PT LONG TERM GOAL #2   Title  pt to increase L shoulder strength to >/=4/5 in all planes to promote shoulder stability with ADLs    Time  6    Period  Weeks    Status  New    Target Date  04/17/19      PT LONG TERM GOAL #3   Title  pt to be able to lift and lower >/=10# to and from an overhead shelf and push/pull ./= 15# to for functional lifting    Time  6    Period  Weeks    Status  New    Target Date  04/17/19      PT LONG TERM GOAL #4   Title  increase FOTO score to </= 34% limited to demo improvmeent in function    Time  6    Period  Weeks    Status  New    Target Date  04/17/19      PT LONG TERM GOAL #5   Title  pt to be I with all HEP given as of last visit to maintain and progress current level of function    Time  6    Period  Weeks    Status  New    Target Date  04/17/19            Plan - 03/20/19 1338    Clinical Impression Statement  continued working on shoulder ROM to promote abduction and interal rotation working behind the back. progressed shoulder strengthening for the rotator cuff which she did well with requiring intermittent cues for proper form. pt reported pain stayed at 5/10 with activity but hasn't worsened.    PT Treatment/Interventions  ADLs/Self Care Home Management;Cryotherapy;Iontophoresis 4mg /ml Dexamethasone;Electrical Stimulation;Moist Heat;Traction;Ultrasound;Therapeutic  activities;Therapeutic exercise;Neuromuscular re-education;Manual techniques;Passive range of motion;Dry needling;Taping    PT Next Visit Plan  shoulder PROM>AAROM, scapular stability, STW along pec major/ minor, sub-scapularis, and upper trap. modalities for pain    PT Home Exercise Plan  shoulder flexion/ abduction table slides, scapular retraction, upper trap stretch, ire stretching with towel across theback, shoulder IR/ER with band, inferior shoulder glides, sidelying shoulder abduction       Patient will benefit from skilled therapeutic intervention in order to improve the following deficits and impairments:  Decreased strength, Decreased activity tolerance, Pain, Impaired UE functional use, Postural dysfunction, Improper body mechanics, Decreased endurance, Increased fascial restricitons  Visit Diagnosis: 1. Chronic left shoulder pain   2. Cervicalgia   3. Muscle weakness (generalized)   4. Stiffness of left shoulder, not elsewhere classified        Problem List Patient Active Problem List   Diagnosis Date Noted  . Paresthesia 02/23/2019  . Cervical radiculitis 01/23/2019  . Arm pain, anterior, left 01/09/2019  . Shoulder pain, left 01/09/2019  . Headache 08/22/2018  . Vitamin D deficiency 03/02/2018  . Taste sense altered 11/24/2017  . Abdominal pain 08/30/2017  . Red eye 12/15/2016  . Chest pain, atypical 06/01/2016  . Left knee pain 06/19/2013  . Lower leg pain 05/12/2013  . Right hip pain 07/15/2012  . Viral warts 07/15/2012  . LBP (low back pain) 07/15/2012  . Barrett's esophagus 04/27/2012  . Trigeminal neuralgia 02/26/2012  . Colon polyp 02/26/2012  . Nail dystrophy 05/11/2011  . Neoplasm of uncertain behavior of skin 05/11/2011  . History of double vision 05/11/2011  . Wart viral 02/04/2011  . UPPER RESPIRATORY INFECTION, ACUTE 09/23/2010  . Rash 09/23/2010  . COUGH 09/23/2010  . Edema 05/05/2010  . GERD 01/15/2010  . Essential hypertension 05/06/2009   . DIVERTICULOSIS, COLON 05/06/2009  . COSTOCHONDRITIS, RECURRENT 05/06/2009  . CHEST WALL PAIN 05/06/2009  . PNEUMONIA, HX OF 05/06/2009  . COLONIC POLYPS, HX OF 05/06/2009   Starr Lake PT, DPT, LAT, ATC  03/20/19  1:48 PM      [facility] [address] [place], [place], [address] Phone: [phone]   Fax:  [phone]  Name: Denise Donaldson MRN: [id number] Date of Birth: 03-03-1956

## 2019-03-21 ENCOUNTER — Ambulatory Visit (INDEPENDENT_AMBULATORY_CARE_PROVIDER_SITE_OTHER)
Admission: RE | Admit: 2019-03-21 | Discharge: 2019-03-21 | Disposition: A | Discharge status: Home / Self Care | Payer: Self-pay | Source: Ambulatory Visit | Attending: Internal Medicine | Admitting: Internal Medicine

## 2019-03-21 DIAGNOSIS — I10 Essential (primary) hypertension: Secondary | ICD-10-CM

## 2019-03-21 DIAGNOSIS — E785 Hyperlipidemia, unspecified: Secondary | ICD-10-CM

## 2019-03-22 ENCOUNTER — Encounter: Payer: Self-pay | Admitting: Physical Therapy

## 2019-03-22 ENCOUNTER — Other Ambulatory Visit: Payer: Self-pay

## 2019-03-22 ENCOUNTER — Ambulatory Visit: Payer: PPO | Attending: Internal Medicine | Admitting: Physical Therapy

## 2019-03-22 DIAGNOSIS — M6281 Muscle weakness (generalized): Secondary | ICD-10-CM | POA: Insufficient documentation

## 2019-03-22 DIAGNOSIS — M542 Cervicalgia: Secondary | ICD-10-CM | POA: Diagnosis not present

## 2019-03-22 DIAGNOSIS — M25612 Stiffness of left shoulder, not elsewhere classified: Secondary | ICD-10-CM | POA: Insufficient documentation

## 2019-03-22 DIAGNOSIS — G8929 Other chronic pain: Secondary | ICD-10-CM

## 2019-03-22 DIAGNOSIS — M25512 Pain in left shoulder: Secondary | ICD-10-CM | POA: Diagnosis not present

## 2019-03-22 NOTE — Therapy (Signed)
[place], [place], [address] Phone: [phone]   Fax:  [phone]  Physical Therapy Treatment  Patient Details  Name: Denise Donaldson MRN: [id number] Date of Birth: [birth year] Referring Provider (PT): Plotnikov, Evie Lacks, MD   Encounter Date: 03/22/2019  PT End of Session - 03/22/19 1334    Visit Number  4    Number of Visits  13    Date for PT Re-Evaluation  04/17/19    Authorization Type  Health team advantage: Kx mod by 15th visit, progress note at 10 visit    PT Start Time  1334    PT Stop Time  1412    PT Time Calculation (min)  38 min    Activity Tolerance  Patient tolerated treatment well    Behavior During Therapy  [facility] for tasks assessed/performed       Past Medical History:  Diagnosis Date  . Costochondral chest pain 2005   Left  . Diverticulosis   . Foot fracture, right    Dr Louanne Skye  . GERD (gastroesophageal reflux disease)    Dr Olevia Perches, EGD 2011 WNL  . Helicobacter pylori gastritis   . History of pneumonia   . HTN (hypertension)   . Hyperplastic colon polyp   . Menopausal disorder    After 64 Dr Corinna Capra  . Obesity   . Pectus excavatum   . Venous insufficiency    Bilat LE    Past Surgical History:  Procedure Laterality Date  . APPENDECTOMY    . BREAST BIOPSY     left breast excisional biopsy 1980  . CARDIAC CATHETERIZATION  2006   had double vision past cath  . CESAREAN SECTION     1 time  . COLONOSCOPY  04/27/2012   Procedure: COLONOSCOPY;  Surgeon: Lafayette Dragon, MD;  Location: [facility];  Service: Endoscopy;  Laterality: N/A;  . double vision  2006   past cardiac cath/ lasted 9 months and went away  . ESOPHAGOGASTRODUODENOSCOPY  04/27/2012   Procedure: ESOPHAGOGASTRODUODENOSCOPY (EGD);  Surgeon: Lafayette Dragon, MD;  Location: [facility];  Service: Endoscopy;  Laterality: N/A;  . OVARIAN CYST REMOVAL      There were no vitals filed for this visit.  Subjective  Assessment - 03/22/19 1335    Subjective  "I am alittle more sore today, I am not sure why it is more sore"    Currently in Pain?  Yes    Pain Score  7     Pain Orientation  Left    Pain Descriptors / Indicators  Aching    Pain Type  Chronic pain    Pain Onset  More than a month ago    Pain Frequency  Intermittent    Aggravating Factors   any movement         [facility] PT Assessment - 03/22/19 0001      Assessment   Medical Diagnosis   Cervical radiculitis, Paresthesia     Referring Provider (PT)  Plotnikov, Evie Lacks, MD                   [facility] Adult PT Treatment/Exercise - 03/22/19 0001      Elbow Exercises   Elbow Flexion  Strengthening;Left;15 reps;Seated   3# x 1 set, 5# x 1 set     Shoulder Exercises: Supine   Protraction  Strengthening;20 reps   with dowel rod     Shoulder Exercises: Seated   Flexion  Strengthening;Both;10 reps;Weights  1# scaption angle   Other Seated Exercises  lower trap strengthening with elbows on bolster 2 x 15 with yellow theraband   tactile cues for full motion     Shoulder Exercises: Sidelying   ABduction  15 reps   x 2 sets     Shoulder Exercises: Standing   External Rotation  Strengthening;Left;12 reps;Theraband    Theraband Level (Shoulder External Rotation)  Level 2 (Red)    Internal Rotation  Strengthening;Left;Theraband;15 reps    Theraband Level (Shoulder Internal Rotation)  Level 2 (Red)    Extension  Strengthening;Both;15 reps;Theraband    Theraband Level (Shoulder Extension)  Level 2 (Red)    Row  Strengthening;Both;15 reps;Theraband    Theraband Level (Shoulder Row)  Level 2 (Red)      Shoulder Exercises: ROM/Strengthening   Other ROM/Strengthening Exercises  dowel rod flexoin 2 x 15      Manual Therapy   Manual Therapy  Manual Traction;Muscle Energy Technique;Soft tissue mobilization    Manual therapy comments  MTPR x 2 along bicep brachii, upper trap, levator scapuale, pec major    Soft tissue mobilization   IASTM along the bicep brachii, upper trap and pec major    Manual Traction  cervical traciton x 5 min               PT Short Term Goals - 03/06/19 1455      PT SHORT TERM GOAL #1   Title  pt to be I with inital HEP    Time  3    Period  Weeks    Status  New    Target Date  03/27/19      PT SHORT TERM GOAL #2   Title  pt to verbalize and demo proper posture and lifting mechanics to reduce and prevent neck/ shoulder pain    Time  3    Period  Weeks    Status  New    Target Date  03/27/19      PT SHORT TERM GOAL #3   Title  pt to increase L grip strength by >/= 10# to demo improving shoulder function    Time  3    Period  Weeks    Status  New    Target Date  03/27/19        PT Long Term Goals - 03/06/19 1456      PT LONG TERM GOAL #1   Title  pt to increase L shoulder ROM grossly to [facility] compared bil with </= 2/10 pain for functional mobility    Time  6    Period  Weeks    Status  New    Target Date  04/17/19      PT LONG TERM GOAL #2   Title  pt to increase L shoulder strength to >/=4/5 in all planes to promote shoulder stability with ADLs    Time  6    Period  Weeks    Status  New    Target Date  04/17/19      PT LONG TERM GOAL #3   Title  pt to be able to lift and lower >/=10# to and from an overhead shelf and push/pull ./= 15# to for functional lifting    Time  6    Period  Weeks    Status  New    Target Date  04/17/19      PT LONG TERM GOAL #4   Title  increase FOTO score to </= 34%  limited to demo improvmeent in function    Time  6    Period  Weeks    Status  New    Target Date  04/17/19      PT LONG TERM GOAL #5   Title  pt to be I with all HEP given as of last visit to maintain and progress current level of function    Time  6    Period  Weeks    Status  New    Target Date  04/17/19            Plan - 03/22/19 1415    Clinical Impression Statement  pt reported increased soreness starting today but was doing well following the  last session. Continued soft tissue techniques and AAROM exercises today. continued working on shoulder strengthening which she reported pain dropped at the end of the session from a 7/10 to a 2/10.    PT Next Visit Plan  shoulder PROM>AAROM, scapular stability, STW along pec major/ minor, sub-scapularis, and upper trap. modalities for pain    PT Home Exercise Plan  shoulder flexion/ abduction table slides, scapular retraction, upper trap stretch, ire stretching with towel across theback, shoulder IR/ER with band, inferior shoulder glides, sidelying shoulder abduction    Consulted and Agree with Plan of Care  Patient       Patient will benefit from skilled therapeutic intervention in order to improve the following deficits and impairments:     Visit Diagnosis: 1. Chronic left shoulder pain   2. Cervicalgia   3. Muscle weakness (generalized)   4. Stiffness of left shoulder, not elsewhere classified        Problem List Patient Active Problem List   Diagnosis Date Noted  . Paresthesia 02/23/2019  . Cervical radiculitis 01/23/2019  . Arm pain, anterior, left 01/09/2019  . Shoulder pain, left 01/09/2019  . Headache 08/22/2018  . Vitamin D deficiency 03/02/2018  . Taste sense altered 11/24/2017  . Abdominal pain 08/30/2017  . Red eye 12/15/2016  . Chest pain, atypical 06/01/2016  . Left knee pain 06/19/2013  . Lower leg pain 05/12/2013  . Right hip pain 07/15/2012  . Viral warts 07/15/2012  . LBP (low back pain) 07/15/2012  . Barrett's esophagus 04/27/2012  . Trigeminal neuralgia 02/26/2012  . Colon polyp 02/26/2012  . Nail dystrophy 05/11/2011  . Neoplasm of uncertain behavior of skin 05/11/2011  . History of double vision 05/11/2011  . Wart viral 02/04/2011  . UPPER RESPIRATORY INFECTION, ACUTE 09/23/2010  . Rash 09/23/2010  . COUGH 09/23/2010  . Edema 05/05/2010  . GERD 01/15/2010  . Essential hypertension 05/06/2009  . DIVERTICULOSIS, COLON 05/06/2009  .  COSTOCHONDRITIS, RECURRENT 05/06/2009  . CHEST WALL PAIN 05/06/2009  . PNEUMONIA, HX OF 05/06/2009  . COLONIC POLYPS, HX OF 05/06/2009   Starr Lake PT, DPT, LAT, ATC  03/22/19  2:17 PM      [facility] [address] [place], [place], [address] Phone: [phone]   Fax:  [phone]  Name: Denise Donaldson MRN: [id number] Date of Birth: April 03, 1956

## 2019-04-03 ENCOUNTER — Other Ambulatory Visit: Payer: Self-pay

## 2019-04-03 ENCOUNTER — Ambulatory Visit: Payer: PPO | Admitting: Physical Therapy

## 2019-04-03 ENCOUNTER — Encounter: Payer: Self-pay | Admitting: Physical Therapy

## 2019-04-03 DIAGNOSIS — M25612 Stiffness of left shoulder, not elsewhere classified: Secondary | ICD-10-CM

## 2019-04-03 DIAGNOSIS — M25512 Pain in left shoulder: Secondary | ICD-10-CM | POA: Diagnosis not present

## 2019-04-03 DIAGNOSIS — G8929 Other chronic pain: Secondary | ICD-10-CM

## 2019-04-03 DIAGNOSIS — M542 Cervicalgia: Secondary | ICD-10-CM

## 2019-04-03 DIAGNOSIS — M6281 Muscle weakness (generalized): Secondary | ICD-10-CM

## 2019-04-03 NOTE — Therapy (Signed)
[place], [place], [address] Phone: [phone]   Fax:  [phone]  Physical Therapy Treatment  Patient Details  Name: Denise Donaldson MRN: [id number] Date of Birth: 1956/06/21 Referring Provider (PT): Plotnikov, Evie Lacks, MD   Encounter Date: 04/03/2019  PT End of Session - 04/03/19 1254    Visit Number  5    Number of Visits  13    Date for PT Re-Evaluation  04/17/19    Authorization Type  Health team advantage: Kx mod by 15th visit, progress note at 10 visit    PT Start Time  1254    PT Stop Time  1335    PT Time Calculation (min)  41 min    Activity Tolerance  Patient tolerated treatment well       Past Medical History:  Diagnosis Date  . Costochondral chest pain 2005   Left  . Diverticulosis   . Foot fracture, right    Dr Louanne Skye  . GERD (gastroesophageal reflux disease)    Dr Olevia Perches, EGD 2011 WNL  . Helicobacter pylori gastritis   . History of pneumonia   . HTN (hypertension)   . Hyperplastic colon polyp   . Menopausal disorder    After 35 Dr Corinna Capra  . Obesity   . Pectus excavatum   . Venous insufficiency    Bilat LE    Past Surgical History:  Procedure Laterality Date  . APPENDECTOMY    . BREAST BIOPSY     left breast excisional biopsy 1980  . CARDIAC CATHETERIZATION  2006   had double vision past cath  . CESAREAN SECTION     1 time  . COLONOSCOPY  04/27/2012   Procedure: COLONOSCOPY;  Surgeon: Lafayette Dragon, MD;  Location: [facility];  Service: Endoscopy;  Laterality: N/A;  . double vision  2006   past cardiac cath/ lasted 9 months and went away  . ESOPHAGOGASTRODUODENOSCOPY  04/27/2012   Procedure: ESOPHAGOGASTRODUODENOSCOPY (EGD);  Surgeon: Lafayette Dragon, MD;  Location: [facility];  Service: Endoscopy;  Laterality: N/A;  . OVARIAN CYST REMOVAL      There were no vitals filed for this visit.  Subjective Assessment - 04/03/19 1255    Subjective  "i am doing better  and only 5/10 reaching up to the side, but the motions are improving"    Currently in Pain?  Yes    Pain Score  1     Pain Orientation  Left         [facility] PT Assessment - 04/03/19 0001      Assessment   Medical Diagnosis   Cervical radiculitis, Paresthesia     Referring Provider (PT)  Plotnikov, Evie Lacks, MD                   [facility] Adult PT Treatment/Exercise - 04/03/19 0001      Shoulder Exercises: Standing   Internal Rotation  Strengthening;Left;Theraband;15 reps    Theraband Level (Shoulder Internal Rotation)  Level 3 (Green)    Extension  Strengthening;Both;15 reps;Theraband    Theraband Level (Shoulder Extension)  Level 3 (Green)    Row  Strengthening;Both;15 reps;Theraband    Theraband Level (Shoulder Row)  Level 3 (Green)      Shoulder Exercises: Stretch   Other Shoulder Stretches  bicep stretch 2 x 30 second      Manual Therapy   Manual therapy comments  MTPR along pec major/ minor, along bicep brachii    Joint  Mobilization  GHJ  PA and internal rotation oscillations,     Scapular Mobilization  Grade III upward/ downward rotation, combined with pt reaching bheind the back             PT Education - 04/03/19 1340    Education Details  updated HEP for bicep stretch    Person(s) Educated  Patient    Methods  Explanation;Verbal cues    Comprehension  Verbalized understanding;Verbal cues required       PT Short Term Goals - 03/06/19 1455      PT SHORT TERM GOAL #1   Title  pt to be I with inital HEP    Time  3    Period  Weeks    Status  New    Target Date  03/27/19      PT SHORT TERM GOAL #2   Title  pt to verbalize and demo proper posture and lifting mechanics to reduce and prevent neck/ shoulder pain    Time  3    Period  Weeks    Status  New    Target Date  03/27/19      PT SHORT TERM GOAL #3   Title  pt to increase L grip strength by >/= 10# to demo improving shoulder function    Time  3    Period  Weeks    Status  New     Target Date  03/27/19        PT Long Term Goals - 03/06/19 1456      PT LONG TERM GOAL #1   Title  pt to increase L shoulder ROM grossly to [facility] compared bil with </= 2/10 pain for functional mobility    Time  6    Period  Weeks    Status  New    Target Date  04/17/19      PT LONG TERM GOAL #2   Title  pt to increase L shoulder strength to >/=4/5 in all planes to promote shoulder stability with ADLs    Time  6    Period  Weeks    Status  New    Target Date  04/17/19      PT LONG TERM GOAL #3   Title  pt to be able to lift and lower >/=10# to and from an overhead shelf and push/pull ./= 15# to for functional lifting    Time  6    Period  Weeks    Status  New    Target Date  04/17/19      PT LONG TERM GOAL #4   Title  increase FOTO score to </= 34% limited to demo improvmeent in function    Time  6    Period  Weeks    Status  New    Target Date  04/17/19      PT LONG TERM GOAL #5   Title  pt to be I with all HEP given as of last visit to maintain and progress current level of function    Time  6    Period  Weeks    Status  New    Target Date  04/17/19            Plan - 04/03/19 1341    Clinical Impression Statement  pt reports decreased pain in the l shoulder reaching behind her head and reaching to the side, but does have difficulty with reeaching behind her back. Focused session primarily on shoulder  ROM and GHJ/ scapular mobs. she was able to do all exercises reporting soreness but demonstrated improved behind back reaching.    PT Treatment/Interventions  ADLs/Self Care Home Management;Cryotherapy;Iontophoresis 4mg /ml Dexamethasone;Electrical Stimulation;Moist Heat;Traction;Ultrasound;Therapeutic activities;Therapeutic exercise;Neuromuscular re-education;Manual techniques;Passive range of motion;Dry needling;Taping    PT Next Visit Plan  reaching behind the back scapular stability, STW along pec major/ minor, sub-scapularis, and upper trap. modalities for pain,  scapular mobs, shoulder strengthening,    PT Home Exercise Plan  shoulder flexion/ abduction table slides, scapular retraction, upper trap stretch, ire stretching with towel across theback, shoulder IR/ER with band, inferior shoulder glides, sidelying shoulder abduction, bicep stretch    Consulted and Agree with Plan of Care  Patient       Patient will benefit from skilled therapeutic intervention in order to improve the following deficits and impairments:  Decreased strength, Decreased activity tolerance, Pain, Impaired UE functional use, Postural dysfunction, Improper body mechanics, Decreased endurance, Increased fascial restricitons  Visit Diagnosis: 1. Chronic left shoulder pain   2. Cervicalgia   3. Muscle weakness (generalized)   4. Stiffness of left shoulder, not elsewhere classified        Problem List Patient Active Problem List   Diagnosis Date Noted  . Paresthesia 02/23/2019  . Cervical radiculitis 01/23/2019  . Arm pain, anterior, left 01/09/2019  . Shoulder pain, left 01/09/2019  . Headache 08/22/2018  . Vitamin D deficiency 03/02/2018  . Taste sense altered 11/24/2017  . Abdominal pain 08/30/2017  . Red eye 12/15/2016  . Chest pain, atypical 06/01/2016  . Left knee pain 06/19/2013  . Lower leg pain 05/12/2013  . Right hip pain 07/15/2012  . Viral warts 07/15/2012  . LBP (low back pain) 07/15/2012  . Barrett's esophagus 04/27/2012  . Trigeminal neuralgia 02/26/2012  . Colon polyp 02/26/2012  . Nail dystrophy 05/11/2011  . Neoplasm of uncertain behavior of skin 05/11/2011  . History of double vision 05/11/2011  . Wart viral 02/04/2011  . UPPER RESPIRATORY INFECTION, ACUTE 09/23/2010  . Rash 09/23/2010  . COUGH 09/23/2010  . Edema 05/05/2010  . GERD 01/15/2010  . Essential hypertension 05/06/2009  . DIVERTICULOSIS, COLON 05/06/2009  . COSTOCHONDRITIS, RECURRENT 05/06/2009  . CHEST WALL PAIN 05/06/2009  . PNEUMONIA, HX OF 05/06/2009  . COLONIC POLYPS,  HX OF 05/06/2009   Starr Lake PT, DPT, LAT, ATC  04/03/19  1:53 PM      [facility] [address] [place], [place], [address] Phone: [phone]   Fax:  [phone]  Name: Denise Donaldson MRN: [id number] Date of Birth: 16-Mar-1956

## 2019-04-05 ENCOUNTER — Other Ambulatory Visit: Payer: Self-pay

## 2019-04-05 ENCOUNTER — Encounter: Payer: Self-pay | Admitting: Physical Therapy

## 2019-04-05 ENCOUNTER — Ambulatory Visit: Payer: PPO | Admitting: Physical Therapy

## 2019-04-05 DIAGNOSIS — G8929 Other chronic pain: Secondary | ICD-10-CM

## 2019-04-05 DIAGNOSIS — M25512 Pain in left shoulder: Secondary | ICD-10-CM

## 2019-04-05 DIAGNOSIS — M6281 Muscle weakness (generalized): Secondary | ICD-10-CM

## 2019-04-05 DIAGNOSIS — M542 Cervicalgia: Secondary | ICD-10-CM

## 2019-04-05 DIAGNOSIS — M25612 Stiffness of left shoulder, not elsewhere classified: Secondary | ICD-10-CM

## 2019-04-05 NOTE — Therapy (Signed)
[place], [place], [address] Phone: [phone]   Fax:  [phone]  Physical Therapy Treatment  Patient Details  Name: Denise Donaldson MRN: [id number] Date of Birth: May 26, 1956 Referring Provider (PT): Plotnikov, Evie Lacks, MD   Encounter Date: 04/05/2019  PT End of Session - 04/05/19 1334    Visit Number  6    Number of Visits  13    Date for PT Re-Evaluation  04/17/19    Authorization Type  [employer] team advantage: Kx mod by 15th visit, progress note at 10 visit    PT Start Time  1333    PT Stop Time  1411    PT Time Calculation (min)  38 min       Past Medical History:  Diagnosis Date  . Costochondral chest pain 2005   Left  . Diverticulosis   . Foot fracture, right    Dr Louanne Skye  . GERD (gastroesophageal reflux disease)    Dr Olevia Perches, EGD 2011 WNL  . Helicobacter pylori gastritis   . History of pneumonia   . HTN (hypertension)   . Hyperplastic colon polyp   . Menopausal disorder    After 60 Dr Corinna Capra  . Obesity   . Pectus excavatum   . Venous insufficiency    Bilat LE    Past Surgical History:  Procedure Laterality Date  . APPENDECTOMY    . BREAST BIOPSY     left breast excisional biopsy 1980  . CARDIAC CATHETERIZATION  2006   had double vision past cath  . CESAREAN SECTION     1 time  . COLONOSCOPY  04/27/2012   Procedure: COLONOSCOPY;  Surgeon: Lafayette Dragon, MD;  Location: [facility];  Service: Endoscopy;  Laterality: N/A;  . double vision  2006   past cardiac cath/ lasted 9 months and went away  . ESOPHAGOGASTRODUODENOSCOPY  04/27/2012   Procedure: ESOPHAGOGASTRODUODENOSCOPY (EGD);  Surgeon: Lafayette Dragon, MD;  Location: [facility];  Service: Endoscopy;  Laterality: N/A;  . OVARIAN CYST REMOVAL      There were no vitals filed for this visit.  Subjective Assessment - 04/05/19 1334    Subjective  "I am alittle sore today reaching behind the back"    Currently in Pain?   Yes    Pain Score  1     Pain Orientation  Left    Pain Descriptors / Indicators  Aching    Pain Type  Chronic pain    Pain Onset  More than a month ago    Pain Frequency  Intermittent                       [facility] Adult PT Treatment/Exercise - 04/05/19 0001      Elbow Exercises   Elbow Flexion  Strengthening;Left;15 reps;Seated   with green theraband focus on eccentrics     Shoulder Exercises: ROM/Strengthening   UBE (Upper Arm Bike)  L3 x 6 min    changing direction at 3 min    Ranger  reaching IR behind the back 3 x 10 taking small step to the L between each set      Shoulder Exercises: Stretch   Other Shoulder Stretches  bicep stretch 2 x 30 second   performed during IASTM     Manual Therapy   Manual therapy comments  MTPR along bicep brachii, anterior deltoid,     Joint Mobilization  GHJ  PA and internal rotation oscillations,  Soft tissue mobilization  IASTM along the proximal bicep/ pec, and antior deltoid               PT Short Term Goals - 03/06/19 1455      PT SHORT TERM GOAL #1   Title  pt to be I with inital HEP    Time  3    Period  Weeks    Status  New    Target Date  03/27/19      PT SHORT TERM GOAL #2   Title  pt to verbalize and demo proper posture and lifting mechanics to reduce and prevent neck/ shoulder pain    Time  3    Period  Weeks    Status  New    Target Date  03/27/19      PT SHORT TERM GOAL #3   Title  pt to increase L grip strength by >/= 10# to demo improving shoulder function    Time  3    Period  Weeks    Status  New    Target Date  03/27/19        PT Long Term Goals - 03/06/19 1456      PT LONG TERM GOAL #1   Title  pt to increase L shoulder ROM grossly to [facility] compared bil with </= 2/10 pain for functional mobility    Time  6    Period  Weeks    Status  New    Target Date  04/17/19      PT LONG TERM GOAL #2   Title  pt to increase L shoulder strength to >/=4/5 in all planes to promote shoulder  stability with ADLs    Time  6    Period  Weeks    Status  New    Target Date  04/17/19      PT LONG TERM GOAL #3   Title  pt to be able to lift and lower >/=10# to and from an overhead shelf and push/pull ./= 15# to for functional lifting    Time  6    Period  Weeks    Status  New    Target Date  04/17/19      PT LONG TERM GOAL #4   Title  increase FOTO score to </= 34% limited to demo improvmeent in function    Time  6    Period  Weeks    Status  New    Target Date  04/17/19      PT LONG TERM GOAL #5   Title  pt to be I with all HEP given as of last visit to maintain and progress current level of function    Time  6    Period  Weeks    Status  New    Target Date  04/17/19            Plan - 04/05/19 1414    Clinical Impression Statement  no pain with all motions except for increased soreness reported with reaching behind the back. Focused on STW along the bicep/ deltoid, and GHJ mobs to promote mobility. she performed exercises noting cotninued soreness inthe shoulder but declined modalities. discussed potential ionto which she declined.    PT Next Visit Plan  UE ranger reaching behind the back scapular stability, STW along pec major/ minor, sub-scapularis, and upper trap. modalities for pain, scapular mobs, shoulder strengthening,    PT Home Exercise Plan  shoulder flexion/ abduction table  slides, scapular retraction, upper trap stretch, ire stretching with towel across theback, shoulder IR/ER with band, inferior shoulder glides, sidelying shoulder abduction, bicep stretch    Consulted and Agree with Plan of Care  Patient       Patient will benefit from skilled therapeutic intervention in order to improve the following deficits and impairments:     Visit Diagnosis: 1. Chronic left shoulder pain   2. Cervicalgia   3. Muscle weakness (generalized)   4. Stiffness of left shoulder, not elsewhere classified        Problem List Patient Active Problem List    Diagnosis Date Noted  . Paresthesia 02/23/2019  . Cervical radiculitis 01/23/2019  . Arm pain, anterior, left 01/09/2019  . Shoulder pain, left 01/09/2019  . Headache 08/22/2018  . Vitamin D deficiency 03/02/2018  . Taste sense altered 11/24/2017  . Abdominal pain 08/30/2017  . Red eye 12/15/2016  . Chest pain, atypical 06/01/2016  . Left knee pain 06/19/2013  . Lower leg pain 05/12/2013  . Right hip pain 07/15/2012  . Viral warts 07/15/2012  . LBP (low back pain) 07/15/2012  . Barrett's esophagus 04/27/2012  . Trigeminal neuralgia 02/26/2012  . Colon polyp 02/26/2012  . Nail dystrophy 05/11/2011  . Neoplasm of uncertain behavior of skin 05/11/2011  . History of double vision 05/11/2011  . Wart viral 02/04/2011  . UPPER RESPIRATORY INFECTION, ACUTE 09/23/2010  . Rash 09/23/2010  . COUGH 09/23/2010  . Edema 05/05/2010  . GERD 01/15/2010  . Essential hypertension 05/06/2009  . DIVERTICULOSIS, COLON 05/06/2009  . COSTOCHONDRITIS, RECURRENT 05/06/2009  . CHEST WALL PAIN 05/06/2009  . PNEUMONIA, HX OF 05/06/2009  . COLONIC POLYPS, HX OF 05/06/2009   Starr Lake PT, DPT, LAT, ATC  04/05/19  2:17 PM      [facility] [address] [place], [place], [address] Phone: [phone]   Fax:  [phone]  Name: Denise Donaldson MRN: [id number] Date of Birth: 1956/01/25

## 2019-04-11 ENCOUNTER — Ambulatory Visit: Payer: PPO | Admitting: Physical Therapy

## 2019-04-11 ENCOUNTER — Other Ambulatory Visit: Payer: Self-pay

## 2019-04-11 ENCOUNTER — Encounter: Payer: Self-pay | Admitting: Physical Therapy

## 2019-04-11 DIAGNOSIS — M25612 Stiffness of left shoulder, not elsewhere classified: Secondary | ICD-10-CM

## 2019-04-11 DIAGNOSIS — M542 Cervicalgia: Secondary | ICD-10-CM

## 2019-04-11 DIAGNOSIS — M25512 Pain in left shoulder: Secondary | ICD-10-CM | POA: Diagnosis not present

## 2019-04-11 DIAGNOSIS — G8929 Other chronic pain: Secondary | ICD-10-CM

## 2019-04-11 DIAGNOSIS — M6281 Muscle weakness (generalized): Secondary | ICD-10-CM

## 2019-04-11 NOTE — Therapy (Signed)
[place], [place], [address] Phone: [phone]   Fax:  [phone]  Physical Therapy Treatment  Patient Details  Name: Denise Donaldson MRN: [id number] Date of Birth: 1956/08/26 Referring Provider (PT): Plotnikov, Evie Lacks, MD   Encounter Date: 04/11/2019  PT End of Session - 04/11/19 1302    Visit Number  7    Number of Visits  13    Date for PT Re-Evaluation  04/17/19    Authorization Type  [employer] team advantage: Kx mod by 15th visit, progress note at 10 visit    PT Start Time  1301    PT Stop Time  1339    PT Time Calculation (min)  38 min    Activity Tolerance  Patient tolerated treatment well    Behavior During Therapy  [facility] for tasks assessed/performed       Past Medical History:  Diagnosis Date  . Costochondral chest pain 2005   Left  . Diverticulosis   . Foot fracture, right    Dr Louanne Skye  . GERD (gastroesophageal reflux disease)    Dr Olevia Perches, EGD 2011 WNL  . Helicobacter pylori gastritis   . History of pneumonia   . HTN (hypertension)   . Hyperplastic colon polyp   . Menopausal disorder    After 5 Dr Corinna Capra  . Obesity   . Pectus excavatum   . Venous insufficiency    Bilat LE    Past Surgical History:  Procedure Laterality Date  . APPENDECTOMY    . BREAST BIOPSY     left breast excisional biopsy 1980  . CARDIAC CATHETERIZATION  2006   had double vision past cath  . CESAREAN SECTION     1 time  . COLONOSCOPY  04/27/2012   Procedure: COLONOSCOPY;  Surgeon: Lafayette Dragon, MD;  Location: [facility];  Service: Endoscopy;  Laterality: N/A;  . double vision  2006   past cardiac cath/ lasted 9 months and went away  . ESOPHAGOGASTRODUODENOSCOPY  04/27/2012   Procedure: ESOPHAGOGASTRODUODENOSCOPY (EGD);  Surgeon: Lafayette Dragon, MD;  Location: [facility];  Service: Endoscopy;  Laterality: N/A;  . OVARIAN CYST REMOVAL      There were no vitals filed for this visit.  Subjective  Assessment - 04/11/19 1303    Subjective  "The shoulder is getting better, I am still feeling sore reaching back but its only when"    Currently in Pain?  Yes    Pain Score  2     Pain Orientation  Left    Pain Descriptors / Indicators  Aching    Pain Type  Chronic pain         [facility] PT Assessment - 04/11/19 0001      Assessment   Medical Diagnosis   Cervical radiculitis, Paresthesia     Referring Provider (PT)  Plotnikov, Evie Lacks, MD                   [facility] Adult PT Treatment/Exercise - 04/11/19 0001      Elbow Exercises   Elbow Flexion  Strengthening;15 reps;Bar weights/barbell   5#, bicep curl into arnold press LUE only     Shoulder Exercises: Standing   Horizontal ABduction  Strengthening;10 reps;Theraband    Theraband Level (Shoulder Horizontal ABduction)  Level 3 (Green)    External Rotation  Strengthening;Left;12 reps;Theraband    Theraband Level (Shoulder External Rotation)  Level 3 (Green)    Internal Rotation  Strengthening;Left;Theraband;20 reps  emphasis on eccentrics   Theraband Level (Shoulder Internal Rotation)  Level 3 (Green)    Flexion  Strengthening;10 reps;Weights   emphasis on eccentrics x 3 set   Shoulder Flexion Weight (lbs)  2    Extension  Strengthening;Both;15 reps;Theraband    Theraband Level (Shoulder Extension)  Level 3 (Green)    Row  Strengthening;Both;Theraband;20 reps    Theraband Level (Shoulder Row)  Level 3 (Green)    Other Standing Exercises  overhead scapular depression with green theraband 2 x10      Shoulder Exercises: ROM/Strengthening   UBE (Upper Arm Bike)  L3 x 4 min    changing direction at 2 min   Other ROM/Strengthening Exercises  dowel rod reaching across the back 2 x 10 , and s      Shoulder Exercises: Stretch   Other Shoulder Stretches  pec stretch 2 x 30 using door way    Other Shoulder Stretches  bicep stretch 2 x 30 second               PT Short Term Goals - 03/06/19 1455      PT SHORT  TERM GOAL #1   Title  pt to be I with inital HEP    Time  3    Period  Weeks    Status  New    Target Date  03/27/19      PT SHORT TERM GOAL #2   Title  pt to verbalize and demo proper posture and lifting mechanics to reduce and prevent neck/ shoulder pain    Time  3    Period  Weeks    Status  New    Target Date  03/27/19      PT SHORT TERM GOAL #3   Title  pt to increase L grip strength by >/= 10# to demo improving shoulder function    Time  3    Period  Weeks    Status  New    Target Date  03/27/19        PT Long Term Goals - 03/06/19 1456      PT LONG TERM GOAL #1   Title  pt to increase L shoulder ROM grossly to [facility] compared bil with </= 2/10 pain for functional mobility    Time  6    Period  Weeks    Status  New    Target Date  04/17/19      PT LONG TERM GOAL #2   Title  pt to increase L shoulder strength to >/=4/5 in all planes to promote shoulder stability with ADLs    Time  6    Period  Weeks    Status  New    Target Date  04/17/19      PT LONG TERM GOAL #3   Title  pt to be able to lift and lower >/=10# to and from an overhead shelf and push/pull ./= 15# to for functional lifting    Time  6    Period  Weeks    Status  New    Target Date  04/17/19      PT LONG TERM GOAL #4   Title  increase FOTO score to </= 34% limited to demo improvmeent in function    Time  6    Period  Weeks    Status  New    Target Date  04/17/19      PT LONG TERM GOAL #5   Title  pt  to be I with all HEP given as of last visit to maintain and progress current level of function    Time  6    Period  Weeks    Status  New    Target Date  04/17/19            Plan - 04/11/19 1339    Clinical Impression Statement  no report of pain with motions except for reaching behind the back but does exhibit increased ROM. Focused todays session on shoulder strengthening and functional stretching. she perofrmed all exercise with minimal report of soreness.    PT Next Visit Plan  ROTO,  UE ranger reaching behind the back scapular stability, STW along pec major/ minor, sub-scapularis, and upper trap. modalities for pain, scapular mobs, shoulder strengthening,    PT Home Exercise Plan  shoulder flexion/ abduction table slides, scapular retraction, upper trap stretch, ire stretching with towel across theback, shoulder IR/ER with band, inferior shoulder glides, sidelying shoulder abduction, bicep stretch       Patient will benefit from skilled therapeutic intervention in order to improve the following deficits and impairments:     Visit Diagnosis: 1. Chronic left shoulder pain   2. Cervicalgia   3. Muscle weakness (generalized)   4. Stiffness of left shoulder, not elsewhere classified        Problem List Patient Active Problem List   Diagnosis Date Noted  . Paresthesia 02/23/2019  . Cervical radiculitis 01/23/2019  . Arm pain, anterior, left 01/09/2019  . Shoulder pain, left 01/09/2019  . Headache 08/22/2018  . Vitamin D deficiency 03/02/2018  . Taste sense altered 11/24/2017  . Abdominal pain 08/30/2017  . Red eye 12/15/2016  . Chest pain, atypical 06/01/2016  . Left knee pain 06/19/2013  . Lower leg pain 05/12/2013  . Right hip pain 07/15/2012  . Viral warts 07/15/2012  . LBP (low back pain) 07/15/2012  . Barrett's esophagus 04/27/2012  . Trigeminal neuralgia 02/26/2012  . Colon polyp 02/26/2012  . Nail dystrophy 05/11/2011  . Neoplasm of uncertain behavior of skin 05/11/2011  . History of double vision 05/11/2011  . Wart viral 02/04/2011  . UPPER RESPIRATORY INFECTION, ACUTE 09/23/2010  . Rash 09/23/2010  . COUGH 09/23/2010  . Edema 05/05/2010  . GERD 01/15/2010  . Essential hypertension 05/06/2009  . DIVERTICULOSIS, COLON 05/06/2009  . COSTOCHONDRITIS, RECURRENT 05/06/2009  . CHEST WALL PAIN 05/06/2009  . PNEUMONIA, HX OF 05/06/2009  . COLONIC POLYPS, HX OF 05/06/2009   Starr Lake PT, DPT, LAT, ATC  04/11/19  1:42 PM      [facility] [address] [place], [place], [address] Phone: [phone]   Fax:  [phone]  Name: Denise Donaldson MRN: [id number] Date of Birth: [birth year]

## 2019-04-13 ENCOUNTER — Ambulatory Visit: Payer: PPO | Admitting: Physical Therapy

## 2019-04-20 ENCOUNTER — Ambulatory Visit: Payer: PPO | Admitting: Physical Therapy

## 2019-04-20 ENCOUNTER — Other Ambulatory Visit: Payer: Self-pay

## 2019-04-20 ENCOUNTER — Encounter: Payer: Self-pay | Admitting: Physical Therapy

## 2019-04-20 DIAGNOSIS — M6281 Muscle weakness (generalized): Secondary | ICD-10-CM

## 2019-04-20 DIAGNOSIS — M542 Cervicalgia: Secondary | ICD-10-CM

## 2019-04-20 DIAGNOSIS — G8929 Other chronic pain: Secondary | ICD-10-CM

## 2019-04-20 DIAGNOSIS — M25612 Stiffness of left shoulder, not elsewhere classified: Secondary | ICD-10-CM

## 2019-04-20 DIAGNOSIS — M25512 Pain in left shoulder: Secondary | ICD-10-CM | POA: Diagnosis not present

## 2019-04-20 NOTE — Therapy (Signed)
[place], [place], [address] Phone: [phone]   Fax:  [phone]  Physical Therapy Treatment / Re-certification  Patient Details  Name: Denise Donaldson MRN: [id number] Date of Birth: 1956/06/30 Referring Provider (PT): Plotnikov, Evie Lacks, MD   Encounter Date: 04/20/2019  PT End of Session - 04/20/19 1444    Visit Number  8    Number of Visits  13    Date for PT Re-Evaluation  05/18/19    Authorization Type  [employer] team advantage: Kx mod by 15th visit, progress note at 10 visit    PT Start Time  1445    PT Stop Time  1529    PT Time Calculation (min)  44 min    Activity Tolerance  Patient tolerated treatment well    Behavior During Therapy  [facility] for tasks assessed/performed       Past Medical History:  Diagnosis Date  . Costochondral chest pain 2005   Left  . Diverticulosis   . Foot fracture, right    Dr Louanne Skye  . GERD (gastroesophageal reflux disease)    Dr Olevia Perches, EGD 2011 WNL  . Helicobacter pylori gastritis   . History of pneumonia   . HTN (hypertension)   . Hyperplastic colon polyp   . Menopausal disorder    After 39 Dr Corinna Capra  . Obesity   . Pectus excavatum   . Venous insufficiency    Bilat LE    Past Surgical History:  Procedure Laterality Date  . APPENDECTOMY    . BREAST BIOPSY     left breast excisional biopsy 1980  . CARDIAC CATHETERIZATION  2006   had double vision past cath  . CESAREAN SECTION     1 time  . COLONOSCOPY  04/27/2012   Procedure: COLONOSCOPY;  Surgeon: Lafayette Dragon, MD;  Location: [facility];  Service: Endoscopy;  Laterality: N/A;  . double vision  2006   past cardiac cath/ lasted 9 months and went away  . ESOPHAGOGASTRODUODENOSCOPY  04/27/2012   Procedure: ESOPHAGOGASTRODUODENOSCOPY (EGD);  Surgeon: Lafayette Dragon, MD;  Location: [facility];  Service: Endoscopy;  Laterality: N/A;  . OVARIAN CYST REMOVAL      There were no vitals filed for this  visit.  Subjective Assessment - 04/20/19 1446    Subjective  "I feel like I am at 90%, definitely making progress"    Currently in Pain?  Yes    Pain Score  3     Pain Location  Shoulder    Pain Orientation  Left    Pain Descriptors / Indicators  Aching    Pain Type  Chronic pain    Pain Onset  More than a month ago    Pain Frequency  Intermittent    Aggravating Factors   reaching behind my back    Pain Relieving Factors  resting, HEP         [facility] PT Assessment - 04/20/19 0001      Assessment   Medical Diagnosis   Cervical radiculitis, Paresthesia     Referring Provider (PT)  Plotnikov, Evie Lacks, MD      Observation/Other Assessments   Focus on Therapeutic Outcomes (FOTO)   22% limited      AROM   Left Shoulder Extension  54 Degrees    Left Shoulder Flexion  148 Degrees    Left Shoulder ABduction  135 Degrees    Left Shoulder Internal Rotation  --   T3   Left  Shoulder External Rotation  --   L SIJ     Strength   Left Shoulder Flexion  4-/5    Left Shoulder Extension  4-/5    Left Shoulder ABduction  4-/5    Left Shoulder Internal Rotation  4+/5    Left Shoulder External Rotation  4/5    Left Hand Grip (lbs)  52.6   54,54, 50                  [facility] Adult PT Treatment/Exercise - 04/20/19 0001      Manual Therapy   Manual Therapy  Taping    Manual therapy comments  MTPR along bicep brachii, anterior deltoid,     Joint Mobilization  GHJ  PA/ AP and internal / external rotation rotation oscillations,  distal clavicle grade III PA/ AP    Soft tissue mobilization  IASTM along the proximal bicep/ pec, and antior deltoid    Kinesiotex  Inhibit Muscle      Kinesiotix   Inhibit Muscle   bicep brachii             PT Education - 04/20/19 1529    Education Details  reviewed previously provided HEP. discussed DN and educated about benefits.    Person(s) Educated  Patient    Methods  Explanation;Verbal cues    Comprehension  Verbalized  understanding;Verbal cues required       PT Short Term Goals - 04/20/19 1506      PT SHORT TERM GOAL #1   Title  pt to be I with inital HEP    Period  Weeks    Status  Achieved      PT SHORT TERM GOAL #2   Title  pt to verbalize and demo proper posture and lifting mechanics to reduce and prevent neck/ shoulder pain    Period  Weeks    Status  Achieved      PT SHORT TERM GOAL #3   Title  pt to increase L grip strength by >/= 10# to demo improving shoulder function    Period  Weeks    Status  On-going        PT Long Term Goals - 04/20/19 1506      PT LONG TERM GOAL #1   Title  pt to increase L shoulder ROM grossly to [facility] compared bil with </= 2/10 pain for functional mobility    Baseline  limited reaching behind the back    Period  Weeks    Status  On-going      PT LONG TERM GOAL #2   Title  pt to increase L shoulder strength to >/=4/5 in all planes to promote shoulder stability with ADLs    Period  Weeks    Status  On-going      PT LONG TERM GOAL #3   Title  pt to be able to lift and lower >/=10# to and from an overhead shelf and push/pull ./= 15# to for functional lifting    Period  Weeks    Status  Achieved      PT LONG TERM GOAL #4   Title  increase FOTO score to </= 34% limited to demo improvmeent in function    Period  Weeks    Status  Achieved      PT LONG TERM GOAL #5   Title  pt to be I with all HEP given as of last visit to maintain and progress current level of function  Baseline  independent with current HEP and progress as able.    Time  6    Period  Weeks    Status  On-going            Plan - 04/20/19 1529    Clinical Impression Statement  Denise Donaldson continues to make great progress with shoulder ROM and strength. she exhibits limited reaching behind her back which she reports is the only pain she has. She is making great progress toward goals. Focused session on STW and IASTM techiqeus. Discussed TPDN which she opted to hold off today. plan to  continue with current POC dropping to 1 x a week for the next 4 weeks to work on remaining deficits/ goals and work toward independent exercise.    Rehab Potential  Good    PT Frequency  1x / week    PT Duration  4 weeks    PT Treatment/Interventions  ADLs/Self Care Home Management;Cryotherapy;Iontophoresis 4mg /ml Dexamethasone;Electrical Stimulation;Moist Heat;Traction;Ultrasound;Therapeutic activities;Therapeutic exercise;Neuromuscular re-education;Manual techniques;Passive range of motion;Dry needling;Taping    PT Next Visit Plan  UE ranger reaching behind the back scapular stability, STW along pec major/ minor, sub-scapularis, and upper trap. modalities for pain, scapular mobs, shoulder strengthening, discuss DN, distal clavicle mobs    PT Home Exercise Plan  shoulder flexion/ abduction table slides, scapular retraction, upper trap stretch, ire stretching with towel across theback, shoulder IR/ER with band, inferior shoulder glides, sidelying shoulder abduction, bicep stretch    Consulted and Agree with Plan of Care  Patient       Patient will benefit from skilled therapeutic intervention in order to improve the following deficits and impairments:  Decreased strength, Decreased activity tolerance, Pain, Impaired UE functional use, Postural dysfunction, Improper body mechanics, Decreased endurance, Increased fascial restricitons  Visit Diagnosis: 1. Chronic left shoulder pain   2. Cervicalgia   3. Muscle weakness (generalized)   4. Stiffness of left shoulder, not elsewhere classified        Problem List Patient Active Problem List   Diagnosis Date Noted  . Paresthesia 02/23/2019  . Cervical radiculitis 01/23/2019  . Arm pain, anterior, left 01/09/2019  . Shoulder pain, left 01/09/2019  . Headache 08/22/2018  . Vitamin D deficiency 03/02/2018  . Taste sense altered 11/24/2017  . Abdominal pain 08/30/2017  . Red eye 12/15/2016  . Chest pain, atypical 06/01/2016  . Left knee pain  06/19/2013  . Lower leg pain 05/12/2013  . Right hip pain 07/15/2012  . Viral warts 07/15/2012  . LBP (low back pain) 07/15/2012  . Barrett's esophagus 04/27/2012  . Trigeminal neuralgia 02/26/2012  . Colon polyp 02/26/2012  . Nail dystrophy 05/11/2011  . Neoplasm of uncertain behavior of skin 05/11/2011  . History of double vision 05/11/2011  . Wart viral 02/04/2011  . UPPER RESPIRATORY INFECTION, ACUTE 09/23/2010  . Rash 09/23/2010  . COUGH 09/23/2010  . Edema 05/05/2010  . GERD 01/15/2010  . Essential hypertension 05/06/2009  . DIVERTICULOSIS, COLON 05/06/2009  . COSTOCHONDRITIS, RECURRENT 05/06/2009  . CHEST WALL PAIN 05/06/2009  . PNEUMONIA, HX OF 05/06/2009  . COLONIC POLYPS, HX OF 05/06/2009   Starr Lake PT, DPT, LAT, ATC  04/20/19  3:34 PM      [facility] [address] [place], [place], [address] Phone: [phone]   Fax:  [phone]  Name: Denise Donaldson MRN: [id number] Date of Birth: 20-Dec-1955

## 2019-05-03 DIAGNOSIS — I10 Essential (primary) hypertension: Secondary | ICD-10-CM | POA: Diagnosis not present

## 2019-05-03 DIAGNOSIS — M542 Cervicalgia: Secondary | ICD-10-CM | POA: Diagnosis not present

## 2019-05-03 DIAGNOSIS — M50222 Other cervical disc displacement at C5-C6 level: Secondary | ICD-10-CM | POA: Diagnosis not present

## 2019-05-03 DIAGNOSIS — M12812 Other specific arthropathies, not elsewhere classified, left shoulder: Secondary | ICD-10-CM | POA: Diagnosis not present

## 2019-05-09 ENCOUNTER — Other Ambulatory Visit: Payer: Self-pay | Admitting: Neurosurgery

## 2019-05-09 DIAGNOSIS — M12812 Other specific arthropathies, not elsewhere classified, left shoulder: Secondary | ICD-10-CM

## 2019-05-11 ENCOUNTER — Ambulatory Visit: Payer: PPO | Attending: Internal Medicine | Admitting: Physical Therapy

## 2019-05-11 ENCOUNTER — Encounter: Payer: Self-pay | Admitting: Physical Therapy

## 2019-05-11 ENCOUNTER — Other Ambulatory Visit: Payer: Self-pay

## 2019-05-11 DIAGNOSIS — G8929 Other chronic pain: Secondary | ICD-10-CM | POA: Diagnosis not present

## 2019-05-11 DIAGNOSIS — M6281 Muscle weakness (generalized): Secondary | ICD-10-CM

## 2019-05-11 DIAGNOSIS — M25512 Pain in left shoulder: Secondary | ICD-10-CM | POA: Insufficient documentation

## 2019-05-11 DIAGNOSIS — M542 Cervicalgia: Secondary | ICD-10-CM | POA: Insufficient documentation

## 2019-05-11 DIAGNOSIS — M25612 Stiffness of left shoulder, not elsewhere classified: Secondary | ICD-10-CM | POA: Diagnosis not present

## 2019-05-11 NOTE — Therapy (Signed)
[place], [place], [address] Phone: [phone]   Fax:  [phone]  Physical Therapy Treatment / Discharge Summary  Patient Details  Name: Denise Donaldson MRN: [id number] Date of Birth: [birth year] Referring Provider (PT): Plotnikov, Evie Lacks, MD   Encounter Date: 05/11/2019  PT End of Session - 05/11/19 1020    Visit Number  9    Number of Visits  13    Date for PT Re-Evaluation  05/18/19    Authorization Type  [employer]: Kx mod by 15th visit, progress note at 10 visit    PT Start Time  1025   pt arrived 5 min late and took and additional 5 min finishing payment up front   PT Stop Time  1052    PT Time Calculation (min)  27 min    Activity Tolerance  Patient tolerated treatment well    Behavior During Therapy  [facility] for tasks assessed/performed       Past Medical History:  Diagnosis Date  . Costochondral chest pain 2005   Left  . Diverticulosis   . Foot fracture, right    Dr Louanne Skye  . GERD (gastroesophageal reflux disease)    Dr Olevia Perches, EGD 2011 WNL  . Helicobacter pylori gastritis   . History of pneumonia   . HTN (hypertension)   . Hyperplastic colon polyp   . Menopausal disorder    After 45 Dr Corinna Capra  . Obesity   . Pectus excavatum   . Venous insufficiency    Bilat LE    Past Surgical History:  Procedure Laterality Date  . APPENDECTOMY    . BREAST BIOPSY     left breast excisional biopsy 1980  . CARDIAC CATHETERIZATION  2006   had double vision past cath  . CESAREAN SECTION     1 time  . COLONOSCOPY  04/27/2012   Procedure: COLONOSCOPY;  Surgeon: Lafayette Dragon, MD;  Location: [facility];  Service: Endoscopy;  Laterality: N/A;  . double vision  2006   past cardiac cath/ lasted 9 months and went away  . ESOPHAGOGASTRODUODENOSCOPY  04/27/2012   Procedure: ESOPHAGOGASTRODUODENOSCOPY (EGD);  Surgeon: Lafayette Dragon, MD;  Location: [facility];  Service: Endoscopy;   Laterality: N/A;  . OVARIAN CYST REMOVAL      There were no vitals filed for this visit.  Subjective Assessment - 05/11/19 1020    Subjective  "The shuld is doing about the same today, at rest pain is 1-2,  with acitivty it an  5/10 or more depending activity"    Currently in Pain?  Yes    Pain Score  3     Pain Orientation  Left    Pain Descriptors / Indicators  Aching    Pain Type  Chronic pain         [facility] PT Assessment - 05/11/19 0001      Assessment   Medical Diagnosis   Cervical radiculitis, Paresthesia     Referring Provider (PT)  Plotnikov, Evie Lacks, MD      Observation/Other Assessments   Focus on Therapeutic Outcomes (FOTO)   33% limited      AROM   Left Shoulder Extension  47 Degrees    Left Shoulder Flexion  144 Degrees    Left Shoulder ABduction  121 Degrees    Left Shoulder Internal Rotation  --   to the mid iliac crest   Left Shoulder External Rotation  --   T1  with physical therapy increased ROm and strength. she continues to demonstrate difficulty with reaching behind her back reporting pain along the anterior aspect of the shoulder. she improves ROM with manual techniques but continues to show little to no carry over between sessions. pt reports she plans to see her MD and getting and MRI and opted to discharge at this point in time and continue with her home exericses. She met or partially  met all goals today and is able to maintain current level of function independently and will be discharged from PT today.    PT Treatment/Interventions  ADLs/Self Care Home Management;Cryotherapy;Iontophoresis 47m/ml Dexamethasone;Electrical Stimulation;Moist Heat;Traction;Ultrasound;Therapeutic activities;Therapeutic exercise;Neuromuscular re-education;Manual techniques;Passive range of motion;Dry needling;Taping    PT Next Visit Plan  d/C    PT Home Exercise Plan  shoulder flexion/ abduction table slides, scapular retraction, upper trap stretch, ire stretching with towel across theback, shoulder IR/ER with band, inferior shoulder glides, sidelying shoulder abduction, bicep stretch       Patient will benefit from skilled therapeutic intervention in order to improve the following deficits and impairments:     Visit Diagnosis: Chronic left shoulder pain  Cervicalgia  Muscle weakness (generalized)  Stiffness of left shoulder, not elsewhere classified     Problem List Patient Active Problem List   Diagnosis Date Noted  . Paresthesia 02/23/2019  . Cervical radiculitis 01/23/2019  . Arm pain, anterior, left 01/09/2019  . Shoulder pain, left 01/09/2019  . Headache 08/22/2018  . Vitamin D deficiency 03/02/2018  . Taste sense altered 11/24/2017  . Abdominal pain 08/30/2017  . Red eye 12/15/2016  . Chest pain, atypical 06/01/2016  . Left knee pain 06/19/2013  . Lower leg pain 05/12/2013  . Right hip pain 07/15/2012  . Viral warts 07/15/2012  . LBP (low back pain) 07/15/2012  . Barrett's esophagus 04/27/2012  . Trigeminal neuralgia 02/26/2012  . Colon polyp 02/26/2012  . Nail dystrophy 05/11/2011  . Neoplasm of uncertain behavior of skin 05/11/2011  . History of double vision 05/11/2011  . Wart viral 02/04/2011  . UPPER RESPIRATORY INFECTION, ACUTE 09/23/2010  . Rash 09/23/2010  . COUGH 09/23/2010  . Edema 05/05/2010  . GERD 01/15/2010  . Essential hypertension 05/06/2009  .  DIVERTICULOSIS, COLON 05/06/2009  . COSTOCHONDRITIS, RECURRENT 05/06/2009  . CHEST WALL PAIN 05/06/2009  . PNEUMONIA, HX OF 05/06/2009  . COLONIC POLYPS, HX OF 05/06/2009    LStarr Lake8/20/2020, 10:55 AM  [facility] [address]Phone: [phone]  Fax:  [phone] Name: Denise RawlMRN: [id number] of Birth: [birth year]     PHYSICAL THERAPY DISCHARGE SUMMARY  Visits from Start of Care: 9  Current functional level related to goals / functional outcomes: See goals, FOTO 33% limited   Remaining deficits: Limited reaching behind the back in the L shoulder with constant pain that fluctuates depending on activity.    Education / Equipment: HEP, theraband, posture, lifting mechanics, anatomy  Plan: Patient agrees to discharge.  Patient goals were partially met. Patient is being discharged due to the patient's request.  ?????         Manuelito Poage PT, DPT, LAT, ATC  05/11/19  10:57 AM  with physical therapy increased ROm and strength. she continues to demonstrate difficulty with reaching behind her back reporting pain along the anterior aspect of the shoulder. she improves ROM with manual techniques but continues to show little to no carry over between sessions. pt reports she plans to see her MD and getting and MRI and opted to discharge at this point in time and continue with her home exericses. She met or partially  met all goals today and is able to maintain current level of function independently and will be discharged from PT today.    PT Treatment/Interventions  ADLs/Self Care Home Management;Cryotherapy;Iontophoresis 47m/ml Dexamethasone;Electrical Stimulation;Moist Heat;Traction;Ultrasound;Therapeutic activities;Therapeutic exercise;Neuromuscular re-education;Manual techniques;Passive range of motion;Dry needling;Taping    PT Next Visit Plan  d/C    PT Home Exercise Plan  shoulder flexion/ abduction table slides, scapular retraction, upper trap stretch, ire stretching with towel across theback, shoulder IR/ER with band, inferior shoulder glides, sidelying shoulder abduction, bicep stretch       Patient will benefit from skilled therapeutic intervention in order to improve the following deficits and impairments:     Visit Diagnosis: Chronic left shoulder pain  Cervicalgia  Muscle weakness (generalized)  Stiffness of left shoulder, not elsewhere classified     Problem List Patient Active Problem List   Diagnosis Date Noted  . Paresthesia 02/23/2019  . Cervical radiculitis 01/23/2019  . Arm pain, anterior, left 01/09/2019  . Shoulder pain, left 01/09/2019  . Headache 08/22/2018  . Vitamin D deficiency 03/02/2018  . Taste sense altered 11/24/2017  . Abdominal pain 08/30/2017  . Red eye 12/15/2016  . Chest pain, atypical 06/01/2016  . Left knee pain 06/19/2013  . Lower leg pain 05/12/2013  . Right hip pain 07/15/2012  . Viral warts 07/15/2012  . LBP (low back pain) 07/15/2012  . Barrett's esophagus 04/27/2012  . Trigeminal neuralgia 02/26/2012  . Colon polyp 02/26/2012  . Nail dystrophy 05/11/2011  . Neoplasm of uncertain behavior of skin 05/11/2011  . History of double vision 05/11/2011  . Wart viral 02/04/2011  . UPPER RESPIRATORY INFECTION, ACUTE 09/23/2010  . Rash 09/23/2010  . COUGH 09/23/2010  . Edema 05/05/2010  . GERD 01/15/2010  . Essential hypertension 05/06/2009  .  DIVERTICULOSIS, COLON 05/06/2009  . COSTOCHONDRITIS, RECURRENT 05/06/2009  . CHEST WALL PAIN 05/06/2009  . PNEUMONIA, HX OF 05/06/2009  . COLONIC POLYPS, HX OF 05/06/2009    LStarr Lake8/20/2020, 10:55 AM  [facility] [place] [address]Phone: [phone]  Fax:  [phone] Name: Denise RawlMRN: [id number] of Birth: [birth year]     PHYSICAL THERAPY DISCHARGE SUMMARY  Visits from Start of Care: 9  Current functional level related to goals / functional outcomes: See goals, FOTO 33% limited   Remaining deficits: Limited reaching behind the back in the L shoulder with constant pain that fluctuates depending on activity.    Education / Equipment: HEP, theraband, posture, lifting mechanics, anatomy  Plan: Patient agrees to discharge.  Patient goals were partially met. Patient is being discharged due to the patient's request.  ?????         Manuelito Poage PT, DPT, LAT, ATC  05/11/19  10:57 AM

## 2019-05-12 DIAGNOSIS — M7502 Adhesive capsulitis of left shoulder: Secondary | ICD-10-CM | POA: Diagnosis not present

## 2019-05-15 ENCOUNTER — Ambulatory Visit: Payer: PPO | Admitting: Physical Therapy

## 2019-05-22 ENCOUNTER — Ambulatory Visit: Payer: PPO | Admitting: Physical Therapy

## 2019-05-31 ENCOUNTER — Encounter: Payer: PPO | Admitting: Physical Therapy

## 2019-06-05 ENCOUNTER — Ambulatory Visit
Admission: RE | Admit: 2019-06-05 | Discharge: 2019-06-05 | Disposition: A | Discharge status: Home / Self Care | Payer: PPO | Source: Ambulatory Visit | Attending: Neurosurgery | Admitting: Neurosurgery

## 2019-06-05 ENCOUNTER — Other Ambulatory Visit: Payer: Self-pay

## 2019-06-05 DIAGNOSIS — M7552 Bursitis of left shoulder: Secondary | ICD-10-CM | POA: Diagnosis not present

## 2019-06-05 DIAGNOSIS — M12812 Other specific arthropathies, not elsewhere classified, left shoulder: Secondary | ICD-10-CM

## 2019-07-21 DIAGNOSIS — H2511 Age-related nuclear cataract, right eye: Secondary | ICD-10-CM | POA: Diagnosis not present

## 2019-07-21 DIAGNOSIS — H43811 Vitreous degeneration, right eye: Secondary | ICD-10-CM | POA: Diagnosis not present

## 2019-12-01 ENCOUNTER — Ambulatory Visit: Payer: PPO | Attending: Internal Medicine

## 2019-12-01 DIAGNOSIS — Z23 Encounter for immunization: Secondary | ICD-10-CM

## 2019-12-01 NOTE — Progress Notes (Signed)
   Covid-19 Vaccination Clinic  Name:  Denise Donaldson    MRN: [id number] DOB: 1956-04-01  12/01/2019  [name] was observed post Covid-19 immunization for 15 minutes without incident. She was provided with Vaccine Information Sheet and instruction to access the V-Safe system.   [name] was instructed to call 911 with any severe reactions post vaccine: [name] Difficulty breathing  . Swelling of face and throat  . A fast heartbeat  . A bad rash all over body  . Dizziness and weakness   Immunizations Administered    Name Date Dose VIS Date Route   Pfizer COVID-19 Vaccine 12/01/2019  3:58 PM 0.3 mL 09/01/2019 Intramuscular   Manufacturer: [place]   Lot: [id number]   [place]: [id number]

## 2019-12-18 DIAGNOSIS — H5213 Myopia, bilateral: Secondary | ICD-10-CM | POA: Diagnosis not present

## 2019-12-18 DIAGNOSIS — H43811 Vitreous degeneration, right eye: Secondary | ICD-10-CM | POA: Diagnosis not present

## 2019-12-18 DIAGNOSIS — H25013 Cortical age-related cataract, bilateral: Secondary | ICD-10-CM | POA: Diagnosis not present

## 2019-12-25 ENCOUNTER — Ambulatory Visit: Payer: PPO | Attending: Internal Medicine

## 2019-12-25 DIAGNOSIS — Z23 Encounter for immunization: Secondary | ICD-10-CM

## 2019-12-25 NOTE — Progress Notes (Signed)
   Covid-19 Vaccination Clinic  Name:  Denise Donaldson    MRN: [id number] DOB: 01-15-1956  12/25/2019  [name] was observed post Covid-19 immunization for 15 minutes without incident. She was provided with Vaccine Information Sheet and instruction to access the V-Safe system.   [name] was instructed to call 911 with any severe reactions post vaccine: [name] Difficulty breathing  . Swelling of face and throat  . A fast heartbeat  . A bad rash all over body  . Dizziness and weakness   Immunizations Administered    Name Date Dose VIS Date Route   Pfizer COVID-19 Vaccine 12/25/2019  5:32 PM 0.3 mL 09/01/2019 Intramuscular   Manufacturer: [place]   Lot: [id number]   [place]: [id number]

## 2020-02-22 ENCOUNTER — Other Ambulatory Visit: Payer: Self-pay | Admitting: Internal Medicine

## 2020-03-04 ENCOUNTER — Ambulatory Visit (INDEPENDENT_AMBULATORY_CARE_PROVIDER_SITE_OTHER): Payer: PPO | Admitting: Internal Medicine

## 2020-03-04 ENCOUNTER — Encounter: Payer: Self-pay | Admitting: Internal Medicine

## 2020-03-04 ENCOUNTER — Other Ambulatory Visit: Payer: Self-pay

## 2020-03-04 DIAGNOSIS — E559 Vitamin D deficiency, unspecified: Secondary | ICD-10-CM

## 2020-03-04 DIAGNOSIS — M79669 Pain in unspecified lower leg: Secondary | ICD-10-CM

## 2020-03-04 DIAGNOSIS — M2041 Other hammer toe(s) (acquired), right foot: Secondary | ICD-10-CM

## 2020-03-04 DIAGNOSIS — I10 Essential (primary) hypertension: Secondary | ICD-10-CM

## 2020-03-04 DIAGNOSIS — K2270 Barrett's esophagus without dysplasia: Secondary | ICD-10-CM | POA: Diagnosis not present

## 2020-03-04 MED ORDER — NEBIVOLOL HCL 5 MG PO TABS: 5.0000 mg | ORAL_TABLET | Freq: Two times a day (BID) | ORAL | 3 refills | Status: DC

## 2020-03-04 NOTE — Progress Notes (Signed)
Subjective:  Patient ID: Denise Donaldson Current, female    DOB: 11-06-1955  Age: 64 y.o. MRN: [id number]  CC: No chief complaint on file.   HPI Denise Donaldson presents for HTN - c/o labile BP.... C/o foot pain F/u GERD  Outpatient Medications Prior to Visit  Medication Sig Dispense Refill  . amLODipine (NORVASC) 2.5 MG tablet Take 1 tablet (2.5 mg total) by mouth 2 (two) times daily. 180 tablet 3  . aspirin 81 MG tablet Take 81 mg by mouth as needed.    [name] b complex vitamins tablet Take 1 tablet by mouth daily. 100 tablet 3  . BYSTOLIC 5 MG tablet Take 1 tablet by mouth once daily 30 tablet 0  . Cholecalciferol (VITAMIN D3) 50000 units CAPS Take 1 capsule by mouth once a week. 6 capsule 0  . Cholecalciferol 1000 units tablet Take 2 tablets (2,000 Units total) by mouth daily. 100 tablet 3  . gabapentin (NEURONTIN) 100 MG capsule Take 1 capsule (100 mg total) by mouth 3 (three) times daily. 90 capsule 3  . meloxicam (MOBIC) 15 MG tablet Take 1 tablet (15 mg total) by mouth daily. 30 tablet 3  . pantoprazole (PROTONIX) 40 MG tablet TAKE 1 TABLET BY MOUTH EVERY DAY 90 tablet 1  . triamcinolone (KENALOG) 0.025 % ointment Apply topically 2 (two) times daily. 60 g 2   Facility-Administered Medications Prior to Visit  Medication Dose Route Frequency Provider Last Rate Last Admin  . 0.9 %  sodium chloride infusion  500 mL Intravenous Continuous Nandigam, Kavitha V, MD        ROS: Review of Systems  Constitutional: Positive for unexpected weight change. Negative for activity change, appetite change, chills and fatigue.  HENT: Negative for congestion, mouth sores and sinus pressure.   Eyes: Negative for visual disturbance.  Respiratory: Negative for cough and chest tightness.   Gastrointestinal: Negative for abdominal pain and nausea.  Genitourinary: Negative for difficulty urinating, frequency and vaginal pain.  Musculoskeletal: Positive for arthralgias and gait problem. Negative for  back pain.  Skin: Negative for pallor and rash.  Neurological: Negative for dizziness, tremors, weakness, numbness and headaches.  Psychiatric/Behavioral: Negative for confusion, sleep disturbance and suicidal ideas. The patient is not nervous/anxious.     Objective:  BP (!) 160/78 (BP Location: Left Arm, Patient Position: Sitting, Cuff Size: Large)   Pulse 73   Temp 98.3 F (36.8 C) (Oral)   Ht 5\' 2"  (1.575 m)   Wt 192 lb (87.1 kg)   SpO2 97%   BMI 35.12 kg/m   BP Readings from Last 3 Encounters:  03/04/20 (!) 160/78  11/23/18 (!) 165/100  08/22/18 125/85    Wt Readings from Last 3 Encounters:  03/04/20 192 lb (87.1 kg)  11/23/18 189 lb (85.7 kg)  08/22/18 189 lb (85.7 kg)    Physical Exam Constitutional:      General: She is not in acute distress.    Appearance: She is well-developed. She is obese.  HENT:     Head: Normocephalic.     Right Ear: External ear normal.     Left Ear: External ear normal.     Nose: Nose normal.  Eyes:     General:        Right eye: No discharge.        Left eye: No discharge.     Conjunctiva/sclera: Conjunctivae normal.     Pupils: Pupils are equal, round, and reactive to light.  Neck:     Thyroid:  No thyromegaly.     Vascular: No JVD.     Trachea: No tracheal deviation.  Cardiovascular:     Rate and Rhythm: Normal rate and regular rhythm.     Heart sounds: Normal heart sounds.  Pulmonary:     Effort: No respiratory distress.     Breath sounds: No stridor. No wheezing.  Abdominal:     General: Bowel sounds are normal. There is no distension.     Palpations: Abdomen is soft. There is no mass.     Tenderness: There is no abdominal tenderness. There is no guarding or rebound.  Musculoskeletal:        General: Tenderness present.     Cervical back: Normal range of motion and neck supple.  Lymphadenopathy:     Cervical: No cervical adenopathy.  Skin:    Findings: No erythema or rash.  Neurological:     Cranial Nerves: No  cranial nerve deficit.     Motor: No abnormal muscle tone.     Coordination: Coordination normal.     Gait: Gait abnormal.     Deep Tendon Reflexes: Reflexes normal.  Psychiatric:        Behavior: Behavior normal.        Thought Content: Thought content normal.        Judgment: Judgment normal.     Lab Results  Component Value Date   WBC 8.5 11/24/2017   HGB 15.0 11/24/2017   HCT 44.6 11/24/2017   PLT 426.0 (H) 11/24/2017   GLUCOSE 88 11/24/2017   CHOL 207 (H) 11/24/2017   TRIG 125.0 11/24/2017   HDL 69.30 11/24/2017   LDLCALC 113 (H) 11/24/2017   ALT 23 11/24/2017   AST 18 11/24/2017   NA 138 11/24/2017   K 4.1 11/24/2017   CL 104 11/24/2017   CREATININE 0.69 11/24/2017   BUN 13 11/24/2017   CO2 27 11/24/2017   TSH 0.96 11/24/2017    MR SHOULDER LEFT WO CONTRAST  Result Date: 06/05/2019 CLINICAL DATA:  Left shoulder pain with limited range of motion EXAM: MRI OF THE LEFT SHOULDER WITHOUT CONTRAST TECHNIQUE: Multiplanar, multisequence MR imaging of the shoulder was performed. No intravenous contrast was administered. COMPARISON:  Radiographs from 01/09/2019 FINDINGS: Despite efforts by the technologist and patient, motion artifact is present on today's exam and could not be eliminated. This reduces exam sensitivity and specificity. Rotator cuff:  Mild supraspinatus and subscapularis tendinopathy. Muscles:  Unremarkable Biceps long head:  Unremarkable Acromioclavicular Joint: No significant arthropathy. Type II acromion. Trace fluid in the subacromial subdeltoid bursa. Glenohumeral Joint: Mild degenerative chondral thinning. Otherwise unremarkable. No thickening of the coracohumeral ligament. Labrum:  Grossly unremarkable Bones: No significant extra-articular osseous abnormalities identified. Other: No supplemental non-categorized findings. IMPRESSION: 1. Trace subacromial subdeltoid bursitis. 2. Mild supraspinatus and subscapularis tendinopathy without overt tear. 3. Mild  degenerative chondral thinning in the glenohumeral joint. Electronically Signed   By: Van Clines M.D.   On: 06/05/2019 20:08    Assessment & Plan:    Follow-up: No follow-ups on file.  Walker Kehr, MD

## 2020-03-04 NOTE — Assessment & Plan Note (Signed)
Amlodipine Bystolic 5 mg bid

## 2020-03-04 NOTE — Assessment & Plan Note (Signed)
Vit D 

## 2020-03-08 NOTE — Assessment & Plan Note (Signed)
Foot pain - ?[facility] ref was offered Good shoes

## 2020-03-08 NOTE — Assessment & Plan Note (Signed)
F/u w/Dr Silverio Decamp On Protonix  Potential benefits of a long term PPI use as well as potential risks  and complications were explained to the patient and were aknowledged.

## 2020-04-11 ENCOUNTER — Other Ambulatory Visit: Payer: Self-pay

## 2020-04-11 ENCOUNTER — Encounter: Payer: Self-pay | Admitting: Internal Medicine

## 2020-04-11 ENCOUNTER — Ambulatory Visit (INDEPENDENT_AMBULATORY_CARE_PROVIDER_SITE_OTHER): Payer: PPO | Admitting: Internal Medicine

## 2020-04-11 VITALS — BP 132/70 | HR 67 | Temp 98.3°F | Ht 62.0 in | Wt 193.0 lb

## 2020-04-11 DIAGNOSIS — I10 Essential (primary) hypertension: Secondary | ICD-10-CM | POA: Diagnosis not present

## 2020-04-11 DIAGNOSIS — E559 Vitamin D deficiency, unspecified: Secondary | ICD-10-CM | POA: Diagnosis not present

## 2020-04-11 DIAGNOSIS — Z789 Other specified health status: Secondary | ICD-10-CM | POA: Diagnosis not present

## 2020-04-11 DIAGNOSIS — R739 Hyperglycemia, unspecified: Secondary | ICD-10-CM

## 2020-04-11 DIAGNOSIS — R5383 Other fatigue: Secondary | ICD-10-CM | POA: Diagnosis not present

## 2020-04-11 LAB — COMPLETE METABOLIC PANEL WITH GFR
AG Ratio: 1.5 (calc) (ref 1.0–2.5)
ALT: 36 U/L — ABNORMAL HIGH (ref 6–29)
AST: 25 U/L (ref 10–35)
Albumin: 4.5 g/dL (ref 3.6–5.1)
Alkaline phosphatase (APISO): 63 U/L (ref 37–153)
BUN: 11 mg/dL (ref 7–25)
CO2: 26 mmol/L (ref 20–32)
Calcium: 10.2 mg/dL (ref 8.6–10.4)
Chloride: 105 mmol/L (ref 98–110)
Creat: 0.71 mg/dL (ref 0.50–0.99)
GFR, Est African American: 103 mL/min/{1.73_m2} (ref >=60)
GFR, Est Non African American: 89 mL/min/{1.73_m2} (ref >=60)
Globulin: 3.1 g/dL (calc) (ref 1.9–3.7)
Glucose, Bld: 92 mg/dL (ref 65–99)
Potassium: 4.6 mmol/L (ref 3.5–5.3)
Sodium: 140 mmol/L (ref 135–146)
Total Bilirubin: 0.5 mg/dL (ref 0.2–1.2)
Total Protein: 7.6 g/dL (ref 6.1–8.1)

## 2020-04-11 NOTE — Assessment & Plan Note (Signed)
Worse Labs 

## 2020-04-11 NOTE — Assessment & Plan Note (Signed)
Vit D 

## 2020-04-11 NOTE — Assessment & Plan Note (Signed)
Stopped eating meat x 3 years 2018; eating fish, eggs, milk products  Labs

## 2020-04-11 NOTE — Progress Notes (Signed)
Subjective:  Patient ID: Denise Donaldson, female    DOB: 02/16/1956  Age: 64 y.o. MRN: [id number]  CC: No chief complaint on file.   HPI Denise Donaldson presents for fatigue, sleeping a lot, lightheaded x weeks F/u HTN, GERD Stopped eating meat x 3 years; eating fish, eggs, milk products  Outpatient Medications Prior to Visit  Medication Sig Dispense Refill  . amLODipine (NORVASC) 2.5 MG tablet Take 1 tablet (2.5 mg total) by mouth 2 (two) times daily. 180 tablet 3  . aspirin 81 MG tablet Take 81 mg by mouth as needed.    [name] b complex vitamins tablet Take 1 tablet by mouth daily. 100 tablet 3  . Cholecalciferol (VITAMIN D3) 50000 units CAPS Take 1 capsule by mouth once a week. 6 capsule 0  . Cholecalciferol 1000 units tablet Take 2 tablets (2,000 Units total) by mouth daily. 100 tablet 3  . gabapentin (NEURONTIN) 100 MG capsule Take 1 capsule (100 mg total) by mouth 3 (three) times daily. 90 capsule 3  . meloxicam (MOBIC) 15 MG tablet Take 1 tablet (15 mg total) by mouth daily. 30 tablet 3  . nebivolol (BYSTOLIC) 5 MG tablet Take 1 tablet (5 mg total) by mouth in the morning and at bedtime. 180 tablet 3  . pantoprazole (PROTONIX) 40 MG tablet TAKE 1 TABLET BY MOUTH EVERY DAY 90 tablet 1  . triamcinolone (KENALOG) 0.025 % ointment Apply topically 2 (two) times daily. 60 g 2   Facility-Administered Medications Prior to Visit  Medication Dose Route Frequency Provider Last Rate Last Admin  . 0.9 %  sodium chloride infusion  500 mL Intravenous Continuous Nandigam, Kavitha V, MD        ROS: Review of Systems  Constitutional: Positive for fatigue. Negative for activity change, appetite change, chills and unexpected weight change.  HENT: Negative for congestion, mouth sores and sinus pressure.   Eyes: Negative for visual disturbance.  Respiratory: Negative for cough and chest tightness.   Gastrointestinal: Negative for abdominal pain and nausea.  Genitourinary: Negative for  difficulty urinating, frequency and vaginal pain.  Musculoskeletal: Negative for back pain and gait problem.  Skin: Negative for pallor and rash.  Neurological: Positive for light-headedness. Negative for dizziness, tremors, weakness, numbness and headaches.  Psychiatric/Behavioral: Positive for sleep disturbance. Negative for confusion.    Objective:  BP (!) 132/70 (BP Location: Left Arm, Patient Position: Sitting, Cuff Size: Large)   Pulse 67   Temp 98.3 F (36.8 C) (Oral)   Ht 5\' 2"  (1.575 m)   Wt 193 lb (87.5 kg)   SpO2 97%   BMI 35.30 kg/m   BP Readings from Last 3 Encounters:  04/11/20 (!) 132/70  03/04/20 (!) 160/78  11/23/18 (!) 165/100    Wt Readings from Last 3 Encounters:  04/11/20 193 lb (87.5 kg)  03/04/20 192 lb (87.1 kg)  11/23/18 189 lb (85.7 kg)    Physical Exam Constitutional:      General: She is not in acute distress.    Appearance: She is well-developed.  HENT:     Head: Normocephalic.     Right Ear: External ear normal.     Left Ear: External ear normal.     Nose: Nose normal.  Eyes:     General:        Right eye: No discharge.        Left eye: No discharge.     Conjunctiva/sclera: Conjunctivae normal.     Pupils: Pupils are equal, round, and  reactive to light.  Neck:     Thyroid: No thyromegaly.     Vascular: No JVD.     Trachea: No tracheal deviation.  Cardiovascular:     Rate and Rhythm: Normal rate and regular rhythm.     Heart sounds: Normal heart sounds.  Pulmonary:     Effort: No respiratory distress.     Breath sounds: No stridor. No wheezing.  Abdominal:     General: Bowel sounds are normal. There is no distension.     Palpations: Abdomen is soft. There is no mass.     Tenderness: There is no abdominal tenderness. There is no guarding or rebound.  Musculoskeletal:        General: No tenderness.     Cervical back: Normal range of motion and neck supple.  Lymphadenopathy:     Cervical: No cervical adenopathy.  Skin:     Findings: No erythema or rash.  Neurological:     Mental Status: She is oriented to person, place, and time.     Cranial Nerves: No cranial nerve deficit.     Motor: No abnormal muscle tone.     Coordination: Coordination normal.     Deep Tendon Reflexes: Reflexes normal.  Psychiatric:        Behavior: Behavior normal.        Thought Content: Thought content normal.        Judgment: Judgment normal.     Lab Results  Component Value Date   WBC 8.5 11/24/2017   HGB 15.0 11/24/2017   HCT 44.6 11/24/2017   PLT 426.0 (H) 11/24/2017   GLUCOSE 88 11/24/2017   CHOL 207 (H) 11/24/2017   TRIG 125.0 11/24/2017   HDL 69.30 11/24/2017   LDLCALC 113 (H) 11/24/2017   ALT 23 11/24/2017   AST 18 11/24/2017   NA 138 11/24/2017   K 4.1 11/24/2017   CL 104 11/24/2017   CREATININE 0.69 11/24/2017   BUN 13 11/24/2017   CO2 27 11/24/2017   TSH 0.96 11/24/2017    MR SHOULDER LEFT WO CONTRAST  Result Date: 06/05/2019 CLINICAL DATA:  Left shoulder pain with limited range of motion EXAM: MRI OF THE LEFT SHOULDER WITHOUT CONTRAST TECHNIQUE: Multiplanar, multisequence MR imaging of the shoulder was performed. No intravenous contrast was administered. COMPARISON:  Radiographs from 01/09/2019 FINDINGS: Despite efforts by the technologist and patient, motion artifact is present on today's exam and could not be eliminated. This reduces exam sensitivity and specificity. Rotator cuff:  Mild supraspinatus and subscapularis tendinopathy. Muscles:  Unremarkable Biceps long head:  Unremarkable Acromioclavicular Joint: No significant arthropathy. Type II acromion. Trace fluid in the subacromial subdeltoid bursa. Glenohumeral Joint: Mild degenerative chondral thinning. Otherwise unremarkable. No thickening of the coracohumeral ligament. Labrum:  Grossly unremarkable Bones: No significant extra-articular osseous abnormalities identified. Other: No supplemental non-categorized findings. IMPRESSION: 1. Trace subacromial  subdeltoid bursitis. 2. Mild supraspinatus and subscapularis tendinopathy without overt tear. 3. Mild degenerative chondral thinning in the glenohumeral joint. Electronically Signed   By: Van Clines M.D.   On: 06/05/2019 20:08    Assessment & Plan:    Walker Kehr, MD

## 2020-04-11 NOTE — Assessment & Plan Note (Signed)
BP Readings from Last 3 Encounters:  04/11/20 (!) 132/70  03/04/20 (!) 160/78  11/23/18 (!) 165/100  Labs

## 2020-04-12 ENCOUNTER — Other Ambulatory Visit: Payer: Self-pay | Admitting: Internal Medicine

## 2020-04-12 LAB — HEMOGLOBIN A1C
Hgb A1c MFr Bld: 6.2 % of total Hgb — ABNORMAL HIGH (ref <5.7)
Mean Plasma Glucose: 131 (calc)
eAG (mmol/L): 7.3 (calc)

## 2020-04-12 LAB — HEPATIC FUNCTION PANEL
AG Ratio: 1.4 (calc) (ref 1.0–2.5)
ALT: 35 U/L — ABNORMAL HIGH (ref 6–29)
AST: 25 U/L (ref 10–35)
Albumin: 4.5 g/dL (ref 3.6–5.1)
Alkaline phosphatase (APISO): 61 U/L (ref 37–153)
Bilirubin, Direct: 0.1 mg/dL (ref 0.0–0.2)
Globulin: 3.2 g/dL (calc) (ref 1.9–3.7)
Indirect Bilirubin: 0.3 mg/dL (calc) (ref 0.2–1.2)
Total Bilirubin: 0.4 mg/dL (ref 0.2–1.2)
Total Protein: 7.7 g/dL (ref 6.1–8.1)

## 2020-04-12 LAB — CBC WITH DIFFERENTIAL/PLATELET
Absolute Monocytes: 788 cells/uL (ref 200–950)
Basophils Absolute: 83 cells/uL (ref 0–200)
Basophils Relative: 1.1 %
Eosinophils Absolute: 180 cells/uL (ref 15–500)
Eosinophils Relative: 2.4 %
HCT: 46.2 % — ABNORMAL HIGH (ref 35.0–45.0)
Hemoglobin: 15.7 g/dL — ABNORMAL HIGH (ref 11.7–15.5)
Lymphs Abs: 3533 cells/uL (ref 850–3900)
MCH: 30.5 pg (ref 27.0–33.0)
MCHC: 34 g/dL (ref 32.0–36.0)
MCV: 89.9 fL (ref 80.0–100.0)
MPV: 10 fL (ref 7.5–12.5)
Monocytes Relative: 10.5 %
Neutro Abs: 2918 cells/uL (ref 1500–7800)
Neutrophils Relative %: 38.9 %
Platelets: 424 10*3/uL — ABNORMAL HIGH (ref 140–400)
RBC: 5.14 10*6/uL — ABNORMAL HIGH (ref 3.80–5.10)
RDW: 12.5 % (ref 11.0–15.0)
Total Lymphocyte: 47.1 %
WBC: 7.5 10*3/uL (ref 3.8–10.8)

## 2020-04-12 LAB — URINALYSIS
Bilirubin Urine: NEGATIVE
Glucose, UA: NEGATIVE
Hgb urine dipstick: NEGATIVE
Ketones, ur: NEGATIVE
Nitrite: NEGATIVE
Protein, ur: NEGATIVE
Specific Gravity, Urine: 1.008 (ref 1.001–1.03)
pH: 5.5 (ref 5.0–8.0)

## 2020-04-12 LAB — MAGNESIUM: Magnesium: 2.1 mg/dL (ref 1.5–2.5)

## 2020-04-12 LAB — TSH: TSH: 2.57 mIU/L (ref 0.40–4.50)

## 2020-04-12 LAB — CORTISOL: Cortisol, Plasma: 8.5 ug/dL

## 2020-04-12 LAB — VITAMIN B12: Vitamin B-12: 453 pg/mL (ref 200–1100)

## 2020-04-12 LAB — VITAMIN D 25 HYDROXY (VIT D DEFICIENCY, FRACTURES): Vit D, 25-Hydroxy: 20 ng/mL — ABNORMAL LOW (ref 30–100)

## 2020-04-12 MED ORDER — VITAMIN D3 1.25 MG (50000 UT) PO CAPS: 1.0000 | ORAL_CAPSULE | ORAL | 0 refills | Status: DC

## 2020-04-12 MED ORDER — VITAMIN D3 50 MCG (2000 UT) PO CAPS: 2000.0000 [IU] | ORAL_CAPSULE | Freq: Every day | ORAL | 3 refills | Status: DC

## 2020-06-06 ENCOUNTER — Other Ambulatory Visit: Payer: Self-pay

## 2020-06-06 ENCOUNTER — Ambulatory Visit (INDEPENDENT_AMBULATORY_CARE_PROVIDER_SITE_OTHER): Payer: PPO | Admitting: Internal Medicine

## 2020-06-06 ENCOUNTER — Encounter: Payer: Self-pay | Admitting: Internal Medicine

## 2020-06-06 DIAGNOSIS — I10 Essential (primary) hypertension: Secondary | ICD-10-CM

## 2020-06-06 DIAGNOSIS — E559 Vitamin D deficiency, unspecified: Secondary | ICD-10-CM | POA: Diagnosis not present

## 2020-06-06 DIAGNOSIS — E669 Obesity, unspecified: Secondary | ICD-10-CM | POA: Insufficient documentation

## 2020-06-06 DIAGNOSIS — R739 Hyperglycemia, unspecified: Secondary | ICD-10-CM | POA: Diagnosis not present

## 2020-06-06 DIAGNOSIS — Z6835 Body mass index (BMI) 35.0-35.9, adult: Secondary | ICD-10-CM | POA: Diagnosis not present

## 2020-06-06 MED ORDER — METFORMIN HCL 500 MG PO TABS: 500.0000 mg | ORAL_TABLET | Freq: Every day | ORAL | 3 refills | Status: DC

## 2020-06-06 MED ORDER — CLONIDINE HCL 0.1 MG PO TABS: 0.1000 mg | ORAL_TABLET | Freq: Two times a day (BID) | ORAL | 1 refills | Status: AC | PRN

## 2020-06-06 NOTE — Assessment & Plan Note (Addendum)
Start Metformin Loose wt

## 2020-06-06 NOTE — Assessment & Plan Note (Signed)
On Vit D 

## 2020-06-06 NOTE — Patient Instructions (Signed)
Start Metformin

## 2020-06-06 NOTE — Assessment & Plan Note (Signed)
BMI 35 

## 2020-06-06 NOTE — Assessment & Plan Note (Signed)
Clonidine prn added for very high BP episodes SBP>180

## 2020-06-06 NOTE — Progress Notes (Signed)
Subjective:  Patient ID: Denise Donaldson Current, female    DOB: March 08, 1956  Age: 64 y.o. MRN: [id number]  CC: No chief complaint on file.   HPI [name] presents for HTN - SBP 180 at times (once a week or so) F/u fatigue - not better, elevated glucose.   Outpatient Medications Prior to Visit  Medication Sig Dispense Refill  . amLODipine (NORVASC) 2.5 MG tablet Take 1 tablet (2.5 mg total) by mouth 2 (two) times daily. 180 tablet 3  . b complex vitamins tablet Take 1 tablet by mouth daily. 100 tablet 3  . Cholecalciferol (VITAMIN D3) 1.25 MG (50000 UT) CAPS Take 1 capsule by mouth once a week. 8 capsule 0  . Cholecalciferol (VITAMIN D3) 50 MCG (2000 UT) capsule Take 1 capsule (2,000 Units total) by mouth daily. 100 capsule 3  . nebivolol (BYSTOLIC) 5 MG tablet Take 1 tablet (5 mg total) by mouth in the morning and at bedtime. 180 tablet 3  . pantoprazole (PROTONIX) 40 MG tablet TAKE 1 TABLET BY MOUTH EVERY DAY 90 tablet 1  . triamcinolone (KENALOG) 0.025 % ointment Apply topically 2 (two) times daily. 60 g 2   Facility-Administered Medications Prior to Visit  Medication Dose Route Frequency Provider Last Rate Last Admin  . 0.9 %  sodium chloride infusion  500 mL Intravenous Continuous Nandigam, Kavitha V, MD        ROS: Review of Systems  Constitutional: Negative for activity change, appetite change, chills, fatigue and unexpected weight change.  HENT: Negative for congestion, mouth sores and sinus pressure.   Eyes: Negative for visual disturbance.  Respiratory: Negative for cough and chest tightness.   Gastrointestinal: Negative for abdominal pain and nausea.  Genitourinary: Negative for difficulty urinating, frequency and vaginal pain.  Musculoskeletal: Negative for back pain and gait problem.  Skin: Negative for pallor and rash.  Neurological: Negative for dizziness, tremors, weakness, numbness and headaches.  Psychiatric/Behavioral: Negative for confusion and sleep  disturbance.    Objective:  BP (!) 146/78 (BP Location: Right Arm, Patient Position: Sitting, Cuff Size: Large)   Pulse 63   Temp 98.1 F (36.7 C) (Oral)   Ht 5\' 2"  (1.575 m)   Wt 192 lb (87.1 kg)   SpO2 98%   BMI 35.12 kg/m   BP Readings from Last 3 Encounters:  06/06/20 (!) 146/78  04/11/20 (!) 132/70  03/04/20 (!) 160/78    Wt Readings from Last 3 Encounters:  06/06/20 192 lb (87.1 kg)  04/11/20 193 lb (87.5 kg)  03/04/20 192 lb (87.1 kg)    Physical Exam Constitutional:      General: She is not in acute distress.    Appearance: She is well-developed. She is obese.  HENT:     Head: Normocephalic.     Right Ear: External ear normal.     Left Ear: External ear normal.     Nose: Nose normal.  Eyes:     General:        Right eye: No discharge.        Left eye: No discharge.     Conjunctiva/sclera: Conjunctivae normal.     Pupils: Pupils are equal, round, and reactive to light.  Neck:     Thyroid: No thyromegaly.     Vascular: No JVD.     Trachea: No tracheal deviation.  Cardiovascular:     Rate and Rhythm: Normal rate and regular rhythm.     Heart sounds: Normal heart sounds.  Pulmonary:  Effort: No respiratory distress.     Breath sounds: No stridor. No wheezing.  Abdominal:     General: Bowel sounds are normal. There is no distension.     Palpations: Abdomen is soft. There is no mass.     Tenderness: There is no abdominal tenderness. There is no guarding or rebound.  Musculoskeletal:        General: No tenderness.     Cervical back: Normal range of motion and neck supple.  Lymphadenopathy:     Cervical: No cervical adenopathy.  Skin:    Findings: No erythema or rash.  Neurological:     Cranial Nerves: No cranial nerve deficit.     Motor: No abnormal muscle tone.     Coordination: Coordination normal.     Deep Tendon Reflexes: Reflexes normal.  Psychiatric:        Behavior: Behavior normal.        Thought Content: Thought content normal.         Judgment: Judgment normal.     Lab Results  Component Value Date   WBC 7.5 04/11/2020   HGB 15.7 (H) 04/11/2020   HCT 46.2 (H) 04/11/2020   PLT 424 (H) 04/11/2020   GLUCOSE 92 04/11/2020   CHOL 207 (H) 11/24/2017   TRIG 125.0 11/24/2017   HDL 69.30 11/24/2017   LDLCALC 113 (H) 11/24/2017   ALT 35 (H) 04/11/2020   AST 25 04/11/2020   NA 140 04/11/2020   K 4.6 04/11/2020   CL 105 04/11/2020   CREATININE 0.71 04/11/2020   BUN 11 04/11/2020   CO2 26 04/11/2020   TSH 2.57 04/11/2020   HGBA1C 6.2 (H) 04/11/2020    MR SHOULDER LEFT WO CONTRAST  Result Date: 06/05/2019 CLINICAL DATA:  Left shoulder pain with limited range of motion EXAM: MRI OF THE LEFT SHOULDER WITHOUT CONTRAST TECHNIQUE: Multiplanar, multisequence MR imaging of the shoulder was performed. No intravenous contrast was administered. COMPARISON:  Radiographs from 01/09/2019 FINDINGS: Despite efforts by the technologist and patient, motion artifact is present on today's exam and could not be eliminated. This reduces exam sensitivity and specificity. Rotator cuff:  Mild supraspinatus and subscapularis tendinopathy. Muscles:  Unremarkable Biceps long head:  Unremarkable Acromioclavicular Joint: No significant arthropathy. Type II acromion. Trace fluid in the subacromial subdeltoid bursa. Glenohumeral Joint: Mild degenerative chondral thinning. Otherwise unremarkable. No thickening of the coracohumeral ligament. Labrum:  Grossly unremarkable Bones: No significant extra-articular osseous abnormalities identified. Other: No supplemental non-categorized findings. IMPRESSION: 1. Trace subacromial subdeltoid bursitis. 2. Mild supraspinatus and subscapularis tendinopathy without overt tear. 3. Mild degenerative chondral thinning in the glenohumeral joint. Electronically Signed   By: Van Clines M.D.   On: 06/05/2019 20:08    Assessment & Plan:    Walker Kehr, MD

## 2020-06-24 ENCOUNTER — Ambulatory Visit (INDEPENDENT_AMBULATORY_CARE_PROVIDER_SITE_OTHER): Payer: PPO

## 2020-06-24 DIAGNOSIS — Z0000 Encounter for general adult medical examination without abnormal findings: Secondary | ICD-10-CM | POA: Diagnosis not present

## 2020-06-24 NOTE — Progress Notes (Addendum)
I connected with Denise Donaldson today by telephone and verified that I am speaking with the correct person using two identifiers. Location patient: home Location provider: work Persons participating in the virtual visit: Denise Donaldson and [employer], LPN.   I discussed the limitations, risks, security and privacy concerns of performing an evaluation and management service by telephone and the availability of in person appointments. I also discussed with the patient that there may be a patient responsible charge related to this service. The patient expressed understanding and verbally consented to this telephonic visit.    Interactive audio and video telecommunications were attempted between this provider and patient, however failed, due to patient having technical difficulties OR patient did not have access to video capability.  We continued and completed visit with audio only.  Some vital signs may be absent or patient reported.   Time Spent with patient on telephone encounter: 20 minutes  Subjective:   Denise Donaldson is a 64 y.o. female who presents for Medicare Annual (Subsequent) preventive examination.  Review of Systems    No ROS. Medicare Wellness Visit. Cardiac Risk Factors include: advanced age (>38men, >11 women);family history of premature cardiovascular disease;hypertension;obesity (BMI >30kg/m2)     Objective:    There were no vitals filed for this visit. There is no height or weight on file to calculate BMI.  Advanced Directives 06/24/2020 03/06/2019 04/27/2012  Does Patient Have a Medical Advance Directive? No No Patient does not have advance directive  Would patient like information on creating a medical advance directive? No - Patient declined No - Patient declined -    Current Medications (verified) Outpatient Encounter Medications as of 06/24/2020  Medication Sig   amLODipine (NORVASC) 2.5 MG tablet Take 1 tablet (2.5 mg total) by mouth 2  (two) times daily.   b complex vitamins tablet Take 1 tablet by mouth daily.   Cholecalciferol (VITAMIN D3) 50 MCG (2000 UT) capsule Take 1 capsule (2,000 Units total) by mouth daily.   cloNIDine (CATAPRES) 0.1 MG tablet Take 1 tablet (0.1 mg total) by mouth 2 (two) times daily as needed (for blood pressure over 180).   metFORMIN (GLUCOPHAGE) 500 MG tablet Take 1 tablet (500 mg total) by mouth daily with breakfast.   nebivolol (BYSTOLIC) 5 MG tablet Take 1 tablet (5 mg total) by mouth in the morning and at bedtime.   pantoprazole (PROTONIX) 40 MG tablet TAKE 1 TABLET BY MOUTH EVERY DAY   triamcinolone (KENALOG) 0.025 % ointment Apply topically 2 (two) times daily.   Facility-Administered Encounter Medications as of 06/24/2020  Medication   0.9 %  sodium chloride infusion    Allergies (verified) Penicillins, Losartan, Metoprolol tartrate, and Coreg [carvedilol]   History: Past Medical History:  Diagnosis Date   Costochondral chest pain 2005   Left   Diverticulosis    Foot fracture, right    Dr Louanne Skye   GERD (gastroesophageal reflux disease)    Dr Olevia Perches, EGD 1638 WNL   Helicobacter pylori gastritis    History of pneumonia    HTN (hypertension)    Hyperplastic colon polyp    Menopausal disorder    After 52 Dr Corinna Capra   Obesity    Pectus excavatum    Venous insufficiency    Bilat LE   Past Surgical History:  Procedure Laterality Date   APPENDECTOMY     BREAST BIOPSY     left breast excisional biopsy 1980   CARDIAC CATHETERIZATION  2006   had double vision past cath  CESAREAN SECTION     1 time   COLONOSCOPY  04/27/2012   Procedure: COLONOSCOPY;  Surgeon: Lafayette Dragon, MD;  Location: [facility];  Service: Endoscopy;  Laterality: N/A;   double vision  2006   past cardiac cath/ lasted 9 months and went away   ESOPHAGOGASTRODUODENOSCOPY  04/27/2012   Procedure: ESOPHAGOGASTRODUODENOSCOPY (EGD);  Surgeon: Lafayette Dragon, MD;  Location: [facility];  Service: Endoscopy;   Laterality: N/A;   OVARIAN CYST REMOVAL     Family History  Problem Relation Age of Onset   Prostate cancer Father    Heart disease Father    Hypertension Sister    Prostate cancer Paternal Grandfather 70   Colon cancer Paternal Uncle    Stomach cancer Paternal Uncle 60   Diabetes Mother    Diabetes Maternal Aunt    Heart disease Maternal Grandmother    Stroke Paternal Grandmother    Esophageal cancer Neg Hx    Liver cancer Neg Hx    Rectal cancer Neg Hx    Social History   Socioeconomic History   Marital status: Married    Spouse name: Not on file   Number of children: 3   Years of education: Not on file   Highest education level: Not on file  Occupational History   Occupation: retired  Tobacco Use   Smoking status: Never Smoker   Smokeless tobacco: Never Used  Substance and Sexual Activity   Alcohol use: No   Drug use: No   Sexual activity: Yes    Birth control/protection: Post-menopausal  Other Topics Concern   Not on file  Social History Narrative   Daily Caffeine - 2/day   Regular Exercise -  NO   Social Determinants of Health   Financial Resource Strain: Low Risk    Difficulty of Paying Living Expenses: Not hard at all  Food Insecurity: No Food Insecurity   Worried About Charity fundraiser in the Last Year: Never true   [place] in the Last Year: Never true  Transportation Needs: No Transportation Needs   Lack of Transportation (Medical): No   Lack of Transportation (Non-Medical): No  Physical Activity: Sufficiently Active   Days of Exercise per Week: 5 days   Minutes of Exercise per Session: 30 min  Stress: No Stress Concern Present   Feeling of Stress : Not at all  Social Connections:    Frequency of Communication with Friends and Family: Not on file   Frequency of Social Gatherings with Friends and Family: Not on file   Attends Religious Services: Not on Electrical engineer or Organizations: Not on file   Attends Theatre manager Meetings: Not on file   Marital Status: Not on file    Tobacco Counseling Counseling given: Not Answered   Clinical Intake:  Pre-visit preparation completed: Yes  Pain : No/denies pain     Nutritional Risks: None Diabetes: No  How often do you need to have someone help you when you read instructions, pamphlets, or other written materials from your doctor or pharmacy?: 1 - Never What is the last grade level you completed in school?: HSG  Diabetic? no  Interpreter Needed?: No  Information entered by :: Lisette Abu, LPN   Activities of Daily Living In your present state of health, do you have any difficulty performing the following activities: 06/24/2020  Hearing? N  Vision? N  Difficulty concentrating or making decisions? N  Walking or climbing  stairs? N  Dressing or bathing? N  Doing errands, shopping? N  Preparing Food and eating ? N  Using the Toilet? N  In the past six months, have you accidently leaked urine? N  Do you have problems with loss of bowel control? N  Managing your Medications? N  Managing your Finances? N  Housekeeping or managing your Housekeeping? N  Some recent data might be hidden    Patient Care Team: Plotnikov, Evie Lacks, MD as PCP - General Claib[facility] Billings Joyice Faster, MD (Cardiology) Mauri Pole, MD as Consulting Physician (Gastroenterology)  Indicate any recent Medical Services you may have received from other than [facility] providers in the past year (date may be approximate).     Assessment:   This is a routine wellness examination for Boyd.  Hearing/Vision screen No exam data present  Dietary issues and exercise activities discussed: Current Exercise Habits: Home exercise routine, Type of exercise: walking, Time (Minutes): 30, Frequency (Times/Week): 5, Weekly Exercise (Minutes/Week): 150, Intensity: Moderate, Exercise limited by: None identified  Goals   None    Depression Screen PHQ 2/9 Scores 06/24/2020  04/11/2020 11/24/2017  PHQ - 2 Score 0 0 0    Fall Risk Fall Risk  06/24/2020  Falls in the past year? 0  Number falls in past yr: 0  Injury with Fall? 0    Any stairs in or around the home? Yes  If so, are there any without handrails? No  Home free of loose throw rugs in walkways, pet beds, electrical cords, etc? Yes  Adequate lighting in your home to reduce risk of falls? Yes   ASSISTIVE DEVICES UTILIZED TO PREVENT FALLS:  Life alert? No  Use of a cane, walker or w/c? No  Grab bars in the bathroom? No  Shower chair or bench in shower? No  Elevated toilet seat or a handicapped toilet? No   TIMED UP AND GO:  Was the test performed? No .  Length of time to ambulate 10 feet: 0 sec.   Gait steady and fast without use of assistive device  Cognitive Function: not indicated.        Immunizations Immunization History  Administered Date(s) Administered   PFIZER SARS-COV-2 Vaccination 12/01/2019, 12/25/2019   Tdap 06/01/2016    TDAP status: Up to date Flu Vaccine status: Declined, Education has been provided regarding the importance of this vaccine but patient still declined. Advised may receive this vaccine at local pharmacy or Health Dept. Aware to provide a copy of the vaccination record if obtained from local pharmacy or Health Dept. Verbalized acceptance and understanding. Pneumococcal vaccine status: Declined,  Education has been provided regarding the importance of this vaccine but patient still declined. Advised may receive this vaccine at local pharmacy or Health Dept. Aware to provide a copy of the vaccination record if obtained from local pharmacy or Health Dept. Verbalized acceptance and understanding.  Covid-19 vaccine status: Completed vaccines  Qualifies for Shingles Vaccine? Yes   Zostavax completed No   Shingrix Completed?: No.    Education has been provided regarding the importance of this vaccine. Patient has been advised to call insurance company to determine  out of pocket expense if they have not yet received this vaccine. Advised may also receive vaccine at local pharmacy or Health Dept. Verbalized acceptance and understanding.  Screening Tests Health Maintenance  Topic Date Due   Hepatitis C Screening  Never done   MAMMOGRAM  Never done   DEXA SCAN  Never done  PNA vac Low Risk Adult (1 of 2 - PCV13) Never done   INFLUENZA VACCINE  Never done   COLONOSCOPY  04/27/2022   TETANUS/TDAP  06/01/2026   COVID-19 Vaccine  Completed    Health Maintenance  Health Maintenance Due  Topic Date Due   Hepatitis C Screening  Never done   MAMMOGRAM  Never done   DEXA SCAN  Never done   PNA vac Low Risk Adult (1 of 2 - PCV13) Never done   INFLUENZA VACCINE  Never done    Colorectal cancer screening: Completed 04/27/2012. Repeat every 10 years Mammogram status: Completed 10/30/2016. Repeat every year  Lung Cancer Screening: (Low Dose CT Chest recommended if Age 78-80 years, 30 pack-year currently smoking OR have quit w/in 15years.) does not qualify.   Lung Cancer Screening Referral: no  Additional Screening:  Hepatitis C Screening: does qualify; Completed no  Vision Screening: Recommended annual ophthalmology exams for early detection of glaucoma and other disorders of the eye. Is the patient up to date with their annual eye exam?  Yes  Who is the provider or what is the name of the office in which the patient attends annual eye exams? Luberta Mutter, MD If pt is not established with a provider, would they like to be referred to a provider to establish care? No .   Dental Screening: Recommended annual dental exams for proper oral hygiene  Community Resource Referral / Chronic Care Management: CRR required this visit?  No   CCM required this visit?  No      Plan:     I have personally reviewed and noted the following in the patient's chart:   Medical and social history Use of alcohol, tobacco or illicit drugs  Current medications  and supplements Functional ability and status Nutritional status Physical activity Advanced directives List of other physicians Hospitalizations, surgeries, and ER visits in previous 12 months Vitals Screenings to include cognitive, depression, and falls Referrals and appointments  In addition, I have reviewed and discussed with patient certain preventive protocols, quality metrics, and best practice recommendations. A written personalized care plan for preventive services as well as general preventive health recommendations were provided to patient.     Sheral Flow, LPN   [id number]   Nurse Notes:  Patient is cogitatively intact. There were no vitals filed for this visit. There is no height or weight on file to calculate BMI. Patient stated that she has no issues with gait or balance; does not use any assistive devices.    Medical screening examination/treatment/procedure(s) were performed by non-physician practitioner and as supervising physician I was immediately available for consultation/collaboration.  I agree with above. Lew Dawes, MD

## 2020-06-24 NOTE — Patient Instructions (Addendum)
Ms. Denise Donaldson , Thank you for taking time to come for your Medicare Wellness Visit. I appreciate your ongoing commitment to your health goals. Please review the following plan we discussed and let me know if I can assist you in the future.   Screening recommendations/referrals: Colonoscopy: 04/27/2012; due every 10 years Mammogram: 10/30/2016; due every 2 years Bone Density: never done Recommended yearly ophthalmology/optometry visit for glaucoma screening and checkup Recommended yearly dental visit for hygiene and checkup  Vaccinations: Influenza vaccine: declined Pneumococcal vaccine: declined Tdap vaccine: 06/01/2016; due every 10 years Shingles vaccine: declined   Covid-19: completed  Advanced directives: Advance directive discussed with you today. Even though you declined this today please call our office should you change your mind and we can give you the proper paperwork for you to fill out.  Conditions/risks identified: Yes; Reviewed health maintenance screenings with patient today and relevant education, vaccines, and/or referrals were provided. Please continue to do your personal lifestyle choices by: daily care of teeth and gums, regular physical activity (goal should be 5 days a week for 30 minutes), eat a healthy diet, avoid tobacco and drug use, limiting any alcohol intake, taking a low-dose aspirin (if not allergic or have been advised by your provider otherwise) and taking vitamins and minerals as recommended by your provider. Continue doing brain stimulating activities (puzzles, reading, adult coloring books, staying active) to keep memory sharp. Continue to eat heart healthy diet (full of fruits, vegetables, whole grains, lean protein, water--limit salt, fat, and sugar intake) and increase physical activity as tolerated.  Next appointment: Please schedule your next Medicare Wellness Visit with your Nurse Health Advisor in 1 year by calling [phone].   Preventive Care 3  Years and Older, Female Preventive care refers to lifestyle choices and visits with your health care provider that can promote health and wellness. What does preventive care include?  A yearly physical exam. This is also called an annual well check.  Dental exams once or twice a year.  Routine eye exams. Ask your health care provider how often you should have your eyes checked.  Personal lifestyle choices, including:  Daily care of your teeth and gums.  Regular physical activity.  Eating a healthy diet.  Avoiding tobacco and drug use.  Limiting alcohol use.  Practicing safe sex.  Taking low-dose aspirin every day.  Taking vitamin and mineral supplements as recommended by your health care provider. What happens during an annual well check? The services and screenings done by your health care provider during your annual well check will depend on your age, overall health, lifestyle risk factors, and family history of disease. Counseling  Your health care provider may ask you questions about your:  Alcohol use.  Tobacco use.  Drug use.  Emotional well-being.  Home and relationship well-being.  Sexual activity.  Eating habits.  History of falls.  Memory and ability to understand (cognition).  Work and work Statistician.  Reproductive health. Screening  You may have the following tests or measurements:  Height, weight, and BMI.  Blood pressure.  Lipid and cholesterol levels. These may be checked every 5 years, or more frequently if you are over 7 years old.  Skin check.  Lung cancer screening. You may have this screening every year starting at age 88 if you have a 30-pack-year history of smoking and currently smoke or have quit within the past 15 years.  Fecal occult blood test (FOBT) of the stool. You may have this test every year starting at  age 36.  Flexible sigmoidoscopy or colonoscopy. You may have a sigmoidoscopy every 5 years or a colonoscopy every  10 years starting at age 32.  Hepatitis C blood test.  Hepatitis B blood test.  Sexually transmitted disease (STD) testing.  Diabetes screening. This is done by checking your blood sugar (glucose) after you have not eaten for a while (fasting). You may have this done every 1-3 years.  Bone density scan. This is done to screen for osteoporosis. You may have this done starting at age 70.  Mammogram. This may be done every 1-2 years. Talk to your health care provider about how often you should have regular mammograms. Talk with your health care provider about your test results, treatment options, and if necessary, the need for more tests. Vaccines  Your health care provider may recommend certain vaccines, such as:  Influenza vaccine. This is recommended every year.  Tetanus, diphtheria, and acellular pertussis (Tdap, Td) vaccine. You may need a Td booster every 10 years.  Zoster vaccine. You may need this after age 70.  Pneumococcal 13-valent conjugate (PCV13) vaccine. One dose is recommended after age 50.  Pneumococcal polysaccharide (PPSV23) vaccine. One dose is recommended after age 46. Talk to your health care provider about which screenings and vaccines you need and how often you need them. This information is not intended to replace advice given to you by your health care provider. Make sure you discuss any questions you have with your health care provider. Document Released: 10/04/2015 Document Revised: 05/27/2016 Document Reviewed: 07/09/2015 Elsevier Interactive Patient Education  2017 [place] Prevention in the Home Falls can cause injuries. They can happen to people of all ages. There are many things you can do to make your home safe and to help prevent falls. What can I do on the outside of my home?  Regularly fix the edges of walkways and driveways and fix any cracks.  Remove anything that might make you trip as you walk through a door, such as a raised step  or threshold.  Trim any bushes or trees on the path to your home.  Use bright outdoor lighting.  Clear any walking paths of anything that might make someone trip, such as rocks or tools.  Regularly check to see if handrails are loose or broken. Make sure that both sides of any steps have handrails.  Any raised decks and porches should have guardrails on the edges.  Have any leaves, snow, or ice cleared regularly.  Use sand or salt on walking paths during winter.  Clean up any spills in your garage right away. This includes oil or grease spills. What can I do in the bathroom?  Use night lights.  Install grab bars by the toilet and in the tub and shower. Do not use towel bars as grab bars.  Use non-skid mats or decals in the tub or shower.  If you need to sit down in the shower, use a plastic, non-slip stool.  Keep the floor dry. Clean up any water that spills on the floor as soon as it happens.  Remove soap buildup in the tub or shower regularly.  Attach bath mats securely with double-sided non-slip rug tape.  Do not have throw rugs and other things on the floor that can make you trip. What can I do in the bedroom?  Use night lights.  Make sure that you have a light by your bed that is easy to reach.  Do not use any  sheets or blankets that are too big for your bed. They should not hang down onto the floor.  Have a firm chair that has side arms. You can use this for support while you get dressed.  Do not have throw rugs and other things on the floor that can make you trip. What can I do in the kitchen?  Clean up any spills right away.  Avoid walking on wet floors.  Keep items that you use a lot in easy-to-reach places.  If you need to reach something above you, use a strong step stool that has a grab bar.  Keep electrical cords out of the way.  Do not use floor polish or wax that makes floors slippery. If you must use wax, use non-skid floor wax.  Do not have  throw rugs and other things on the floor that can make you trip. What can I do with my stairs?  Do not leave any items on the stairs.  Make sure that there are handrails on both sides of the stairs and use them. Fix handrails that are broken or loose. Make sure that handrails are as long as the stairways.  Check any carpeting to make sure that it is firmly attached to the stairs. Fix any carpet that is loose or worn.  Avoid having throw rugs at the top or bottom of the stairs. If you do have throw rugs, attach them to the floor with carpet tape.  Make sure that you have a light switch at the top of the stairs and the bottom of the stairs. If you do not have them, ask someone to add them for you. What else can I do to help prevent falls?  Wear shoes that:  Do not have high heels.  Have rubber bottoms.  Are comfortable and fit you well.  Are closed at the toe. Do not wear sandals.  If you use a stepladder:  Make sure that it is fully opened. Do not climb a closed stepladder.  Make sure that both sides of the stepladder are locked into place.  Ask someone to hold it for you, if possible.  Clearly mark and make sure that you can see:  Any grab bars or handrails.  First and last steps.  Where the edge of each step is.  Use tools that help you move around (mobility aids) if they are needed. These include:  Canes.  Walkers.  Scooters.  Crutches.  Turn on the lights when you go into a dark area. Replace any light bulbs as soon as they burn out.  Set up your furniture so you have a clear path. Avoid moving your furniture around.  If any of your floors are uneven, fix them.  If there are any pets around you, be aware of where they are.  Review your medicines with your doctor. Some medicines can make you feel dizzy. This can increase your chance of falling. Ask your doctor what other things that you can do to help prevent falls. This information is not intended to  replace advice given to you by your health care provider. Make sure you discuss any questions you have with your health care provider. Document Released: 07/04/2009 Document Revised: 02/13/2016 Document Reviewed: 10/12/2014 Elsevier Interactive Patient Education  2017 [employer].

## 2020-07-09 IMAGING — DX CERVICAL SPINE - COMPLETE 4+ VIEW
5 series · 5 of 5 positions shown · non-contrast
Comparison: Concurrently obtained radiographs of the left shoulder

CLINICAL DATA: 63-year-old female with neck and left shoulder pain

EXAM:
CERVICAL SPINE - COMPLETE 4+ VIEW

[c-spine lat]
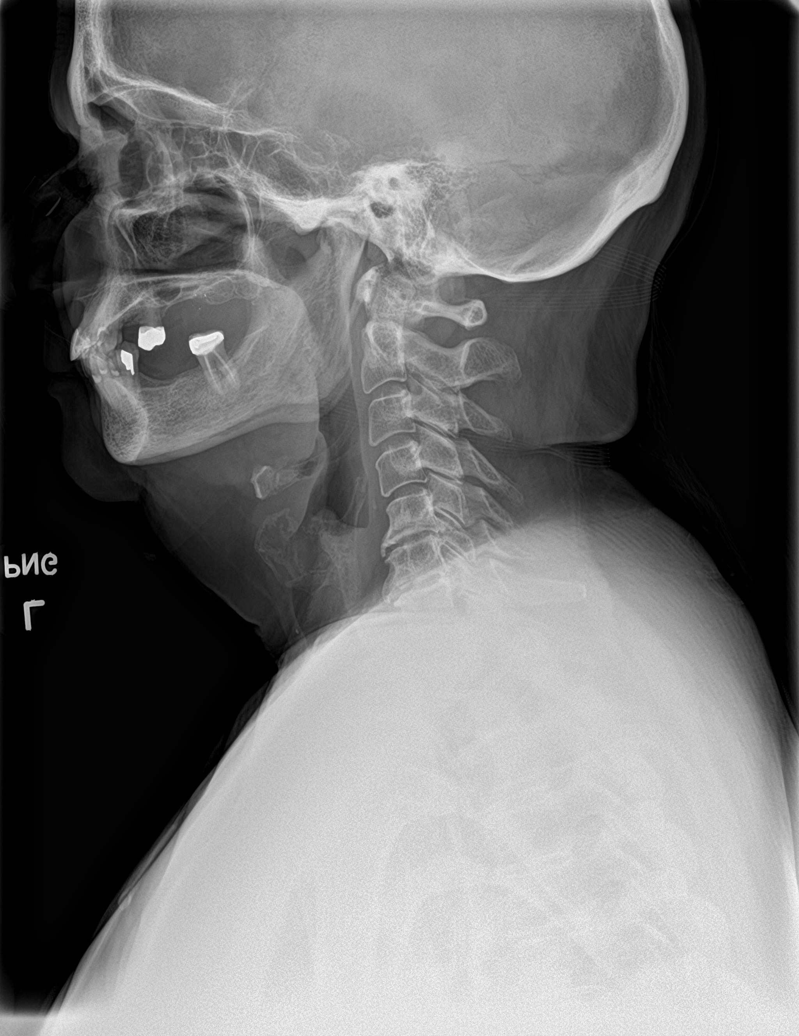

[c-spine obl (1 of 2)]
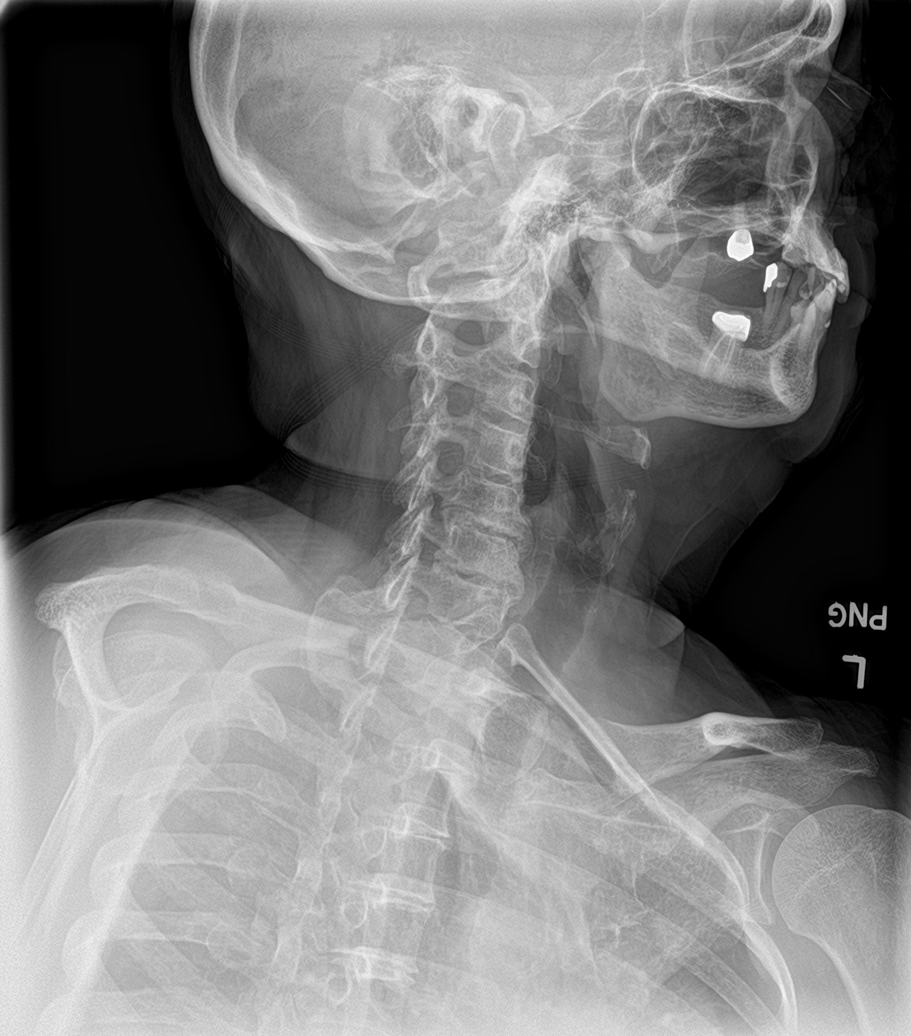

[c-spine obl (2 of 2)]
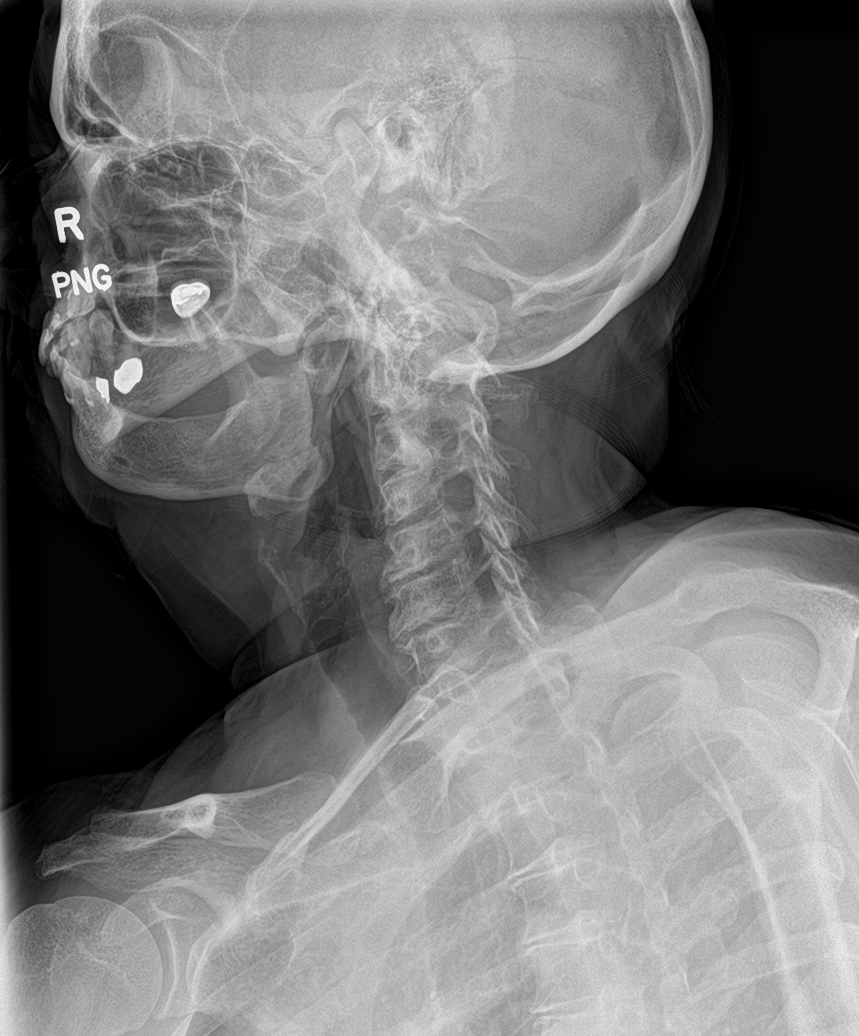

[c-spine ap]
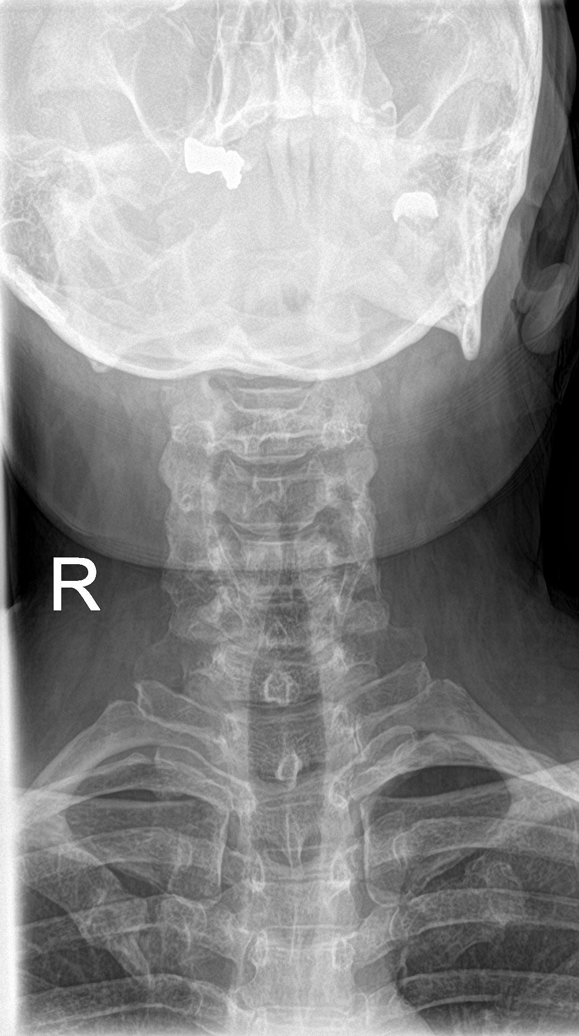

[c-spine open mouth]
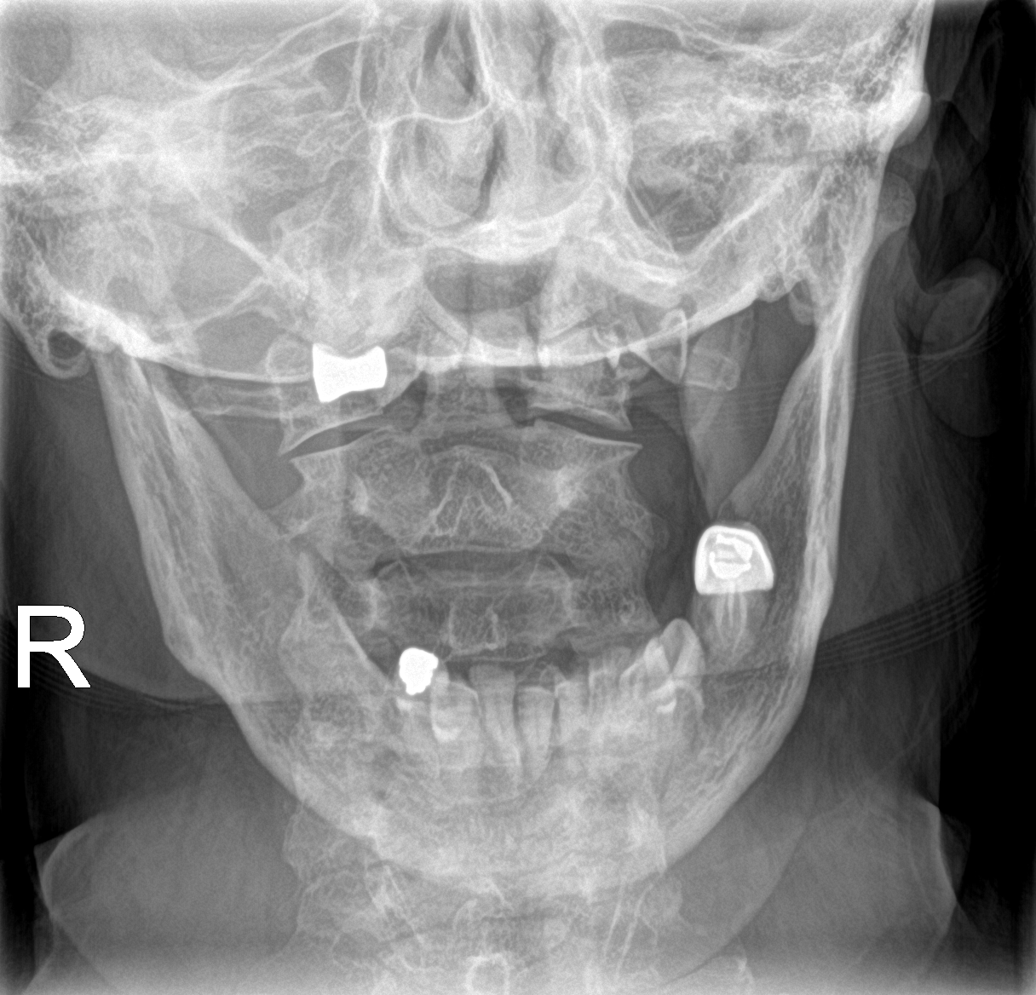

[5 of 5 positions shown; findings below may reference images not displayed]

FINDINGS: No evidence of acute fracture or malalignment. Focal degenerative
disc disease present at C5-C6 and C6-C7. Moderate left-sided
foraminal narrowing at C5-C6. Mild foraminal narrowing on the left
at C6-C7. No significant foraminal narrowing on the right.
Visualized upper lungs are unremarkable. No lytic or blastic osseous
lesion.
IMPRESSION: 1. Moderate left-sided foraminal stenosis at C5-C6.
2. Degenerative disc disease present at C5-C6 and C6-C7.

## 2020-08-06 ENCOUNTER — Ambulatory Visit (INDEPENDENT_AMBULATORY_CARE_PROVIDER_SITE_OTHER): Payer: PPO | Admitting: Internal Medicine

## 2020-08-06 ENCOUNTER — Other Ambulatory Visit: Payer: Self-pay

## 2020-08-06 ENCOUNTER — Encounter: Payer: Self-pay | Admitting: Internal Medicine

## 2020-08-06 DIAGNOSIS — E1165 Type 2 diabetes mellitus with hyperglycemia: Secondary | ICD-10-CM

## 2020-08-06 DIAGNOSIS — E1169 Type 2 diabetes mellitus with other specified complication: Secondary | ICD-10-CM | POA: Insufficient documentation

## 2020-08-06 DIAGNOSIS — I10 Essential (primary) hypertension: Secondary | ICD-10-CM | POA: Diagnosis not present

## 2020-08-06 DIAGNOSIS — E559 Vitamin D deficiency, unspecified: Secondary | ICD-10-CM | POA: Diagnosis not present

## 2020-08-06 DIAGNOSIS — E1129 Type 2 diabetes mellitus with other diabetic kidney complication: Secondary | ICD-10-CM | POA: Diagnosis not present

## 2020-08-06 DIAGNOSIS — R739 Hyperglycemia, unspecified: Secondary | ICD-10-CM | POA: Diagnosis not present

## 2020-08-06 DIAGNOSIS — E669 Obesity, unspecified: Secondary | ICD-10-CM | POA: Insufficient documentation

## 2020-08-06 DIAGNOSIS — IMO0002 Reserved for concepts with insufficient information to code with codable children: Secondary | ICD-10-CM

## 2020-08-06 LAB — BASIC METABOLIC PANEL
BUN: 11 mg/dL (ref 6–23)
CO2: 25 mEq/L (ref 19–32)
Calcium: 9.8 mg/dL (ref 8.4–10.5)
Chloride: 106 mEq/L (ref 96–112)
Creatinine, Ser: 0.67 mg/dL (ref 0.40–1.20)
GFR: 90.94 mL/min (ref >60.00)
Glucose, Bld: 93 mg/dL (ref 70–99)
Potassium: 3.7 mEq/L (ref 3.5–5.1)
Sodium: 139 mEq/L (ref 135–145)

## 2020-08-06 LAB — HEMOGLOBIN A1C: Hgb A1c MFr Bld: 6.2 % (ref 4.6–6.5)

## 2020-08-06 MED ORDER — NEBIVOLOL HCL 5 MG PO TABS
5.0000 mg | ORAL_TABLET | Freq: Two times a day (BID) | ORAL | 3 refills | Status: DC
Start: 2020-08-06 — End: 2022-11-03

## 2020-08-06 NOTE — Assessment & Plan Note (Signed)
Vit D 

## 2020-08-06 NOTE — Addendum Note (Signed)
Addended by: Cresenciano Lick on: 08/06/2020 02:31 PM   Modules accepted: Orders

## 2020-08-06 NOTE — Progress Notes (Signed)
Subjective:  Patient ID: Denise Donaldson Current, female    DOB: June 22, 1956  Age: 64 y.o. MRN: [id number]  CC: Follow-up (2 MONTH F/U)   HPI [name] presents for HTN, GERD, Vit D f/u  Outpatient Medications Prior to Visit  Medication Sig Dispense Refill   amLODipine (NORVASC) 2.5 MG tablet Take 1 tablet (2.5 mg total) by mouth 2 (two) times daily. 180 tablet 3   b complex vitamins tablet Take 1 tablet by mouth daily. 100 tablet 3   Cholecalciferol (VITAMIN D3) 50 MCG (2000 UT) capsule Take 1 capsule (2,000 Units total) by mouth daily. 100 capsule 3   cloNIDine (CATAPRES) 0.1 MG tablet Take 1 tablet (0.1 mg total) by mouth 2 (two) times daily as needed (for blood pressure over 180). 60 tablet 1   metFORMIN (GLUCOPHAGE) 500 MG tablet Take 1 tablet (500 mg total) by mouth daily with breakfast. 90 tablet 3   nebivolol (BYSTOLIC) 5 MG tablet Take 1 tablet (5 mg total) by mouth in the morning and at bedtime. 180 tablet 3   pantoprazole (PROTONIX) 40 MG tablet TAKE 1 TABLET BY MOUTH EVERY DAY (Patient taking differently: Take 40 mg by mouth as needed. ) 90 tablet 1   triamcinolone (KENALOG) 0.025 % ointment Apply topically 2 (two) times daily. 60 g 2   0.9 %  sodium chloride infusion      No facility-administered medications prior to visit.    ROS: Review of Systems  Constitutional: Negative for activity change, appetite change, chills, fatigue and unexpected weight change.  HENT: Negative for congestion, mouth sores and sinus pressure.   Eyes: Negative for visual disturbance.  Respiratory: Negative for cough and chest tightness.   Gastrointestinal: Negative for abdominal pain and nausea.  Genitourinary: Negative for difficulty urinating, frequency and vaginal pain.  Musculoskeletal: Negative for back pain and gait problem.  Skin: Negative for pallor and rash.  Neurological: Negative for dizziness, tremors, weakness, numbness and headaches.  Psychiatric/Behavioral:  Negative for confusion, sleep disturbance and suicidal ideas.    Objective:  BP 112/78 (BP Location: Left Arm)    Pulse (!) 58    Temp 98.3 F (36.8 C) (Oral)    Ht 5\' 2"  (1.575 m)    Wt 186 lb 9.6 oz (84.6 kg)    SpO2 97%    BMI 34.13 kg/m   BP Readings from Last 3 Encounters:  08/06/20 112/78  06/06/20 (!) 146/78  04/11/20 (!) 132/70    Wt Readings from Last 3 Encounters:  08/06/20 186 lb 9.6 oz (84.6 kg)  06/06/20 192 lb (87.1 kg)  04/11/20 193 lb (87.5 kg)    Physical Exam Constitutional:      General: She is not in acute distress.    Appearance: She is well-developed. She is obese.  HENT:     Head: Normocephalic.     Right Ear: External ear normal.     Left Ear: External ear normal.     Nose: Nose normal.  Eyes:     General:        Right eye: No discharge.        Left eye: No discharge.     Conjunctiva/sclera: Conjunctivae normal.     Pupils: Pupils are equal, round, and reactive to light.  Neck:     Thyroid: No thyromegaly.     Vascular: No JVD.     Trachea: No tracheal deviation.  Cardiovascular:     Rate and Rhythm: Normal rate and regular rhythm.  Heart sounds: Normal heart sounds.  Pulmonary:     Effort: No respiratory distress.     Breath sounds: No stridor. No wheezing.  Abdominal:     General: Bowel sounds are normal. There is no distension.     Palpations: Abdomen is soft. There is no mass.     Tenderness: There is no abdominal tenderness. There is no guarding or rebound.  Musculoskeletal:        General: No tenderness.     Cervical back: Normal range of motion and neck supple.  Lymphadenopathy:     Cervical: No cervical adenopathy.  Skin:    Findings: No erythema or rash.  Neurological:     Mental Status: She is oriented to person, place, and time.     Cranial Nerves: No cranial nerve deficit.     Motor: No abnormal muscle tone.     Coordination: Coordination normal.     Deep Tendon Reflexes: Reflexes normal.  Psychiatric:         Behavior: Behavior normal.        Thought Content: Thought content normal.        Judgment: Judgment normal.     Lab Results  Component Value Date   WBC 7.5 04/11/2020   HGB 15.7 (H) 04/11/2020   HCT 46.2 (H) 04/11/2020   PLT 424 (H) 04/11/2020   GLUCOSE 92 04/11/2020   CHOL 207 (H) 11/24/2017   TRIG 125.0 11/24/2017   HDL 69.30 11/24/2017   LDLCALC 113 (H) 11/24/2017   ALT 35 (H) 04/11/2020   AST 25 04/11/2020   NA 140 04/11/2020   K 4.6 04/11/2020   CL 105 04/11/2020   CREATININE 0.71 04/11/2020   BUN 11 04/11/2020   CO2 26 04/11/2020   TSH 2.57 04/11/2020   HGBA1C 6.2 (H) 04/11/2020    MR SHOULDER LEFT WO CONTRAST  Result Date: 06/05/2019 CLINICAL DATA:  Left shoulder pain with limited range of motion EXAM: MRI OF THE LEFT SHOULDER WITHOUT CONTRAST TECHNIQUE: Multiplanar, multisequence MR imaging of the shoulder was performed. No intravenous contrast was administered. COMPARISON:  Radiographs from 01/09/2019 FINDINGS: Despite efforts by the technologist and patient, motion artifact is present on today's exam and could not be eliminated. This reduces exam sensitivity and specificity. Rotator cuff:  Mild supraspinatus and subscapularis tendinopathy. Muscles:  Unremarkable Biceps long head:  Unremarkable Acromioclavicular Joint: No significant arthropathy. Type II acromion. Trace fluid in the subacromial subdeltoid bursa. Glenohumeral Joint: Mild degenerative chondral thinning. Otherwise unremarkable. No thickening of the coracohumeral ligament. Labrum:  Grossly unremarkable Bones: No significant extra-articular osseous abnormalities identified. Other: No supplemental non-categorized findings. IMPRESSION: 1. Trace subacromial subdeltoid bursitis. 2. Mild supraspinatus and subscapularis tendinopathy without overt tear. 3. Mild degenerative chondral thinning in the glenohumeral joint. Electronically Signed   By: Van Clines M.D.   On: 06/05/2019 20:08    Assessment & Plan:     Walker Kehr, MD

## 2020-08-06 NOTE — Addendum Note (Signed)
Addended by: Cresenciano Lick on: 08/06/2020 02:30 PM   Modules accepted: Orders

## 2020-08-06 NOTE — Assessment & Plan Note (Signed)
Amlodipine, Clonidine prn and Bystolic

## 2020-08-06 NOTE — Assessment & Plan Note (Signed)
Metformin po

## 2020-09-11 DIAGNOSIS — Z01419 Encounter for gynecological examination (general) (routine) without abnormal findings: Secondary | ICD-10-CM | POA: Diagnosis not present

## 2020-09-11 DIAGNOSIS — Z6834 Body mass index (BMI) 34.0-34.9, adult: Secondary | ICD-10-CM | POA: Diagnosis not present

## 2020-09-11 DIAGNOSIS — Z124 Encounter for screening for malignant neoplasm of cervix: Secondary | ICD-10-CM | POA: Diagnosis not present

## 2020-09-11 DIAGNOSIS — N644 Mastodynia: Secondary | ICD-10-CM | POA: Diagnosis not present

## 2020-09-12 ENCOUNTER — Other Ambulatory Visit: Payer: Self-pay | Admitting: Obstetrics and Gynecology

## 2020-09-12 DIAGNOSIS — N644 Mastodynia: Secondary | ICD-10-CM

## 2020-10-23 ENCOUNTER — Ambulatory Visit
Admission: RE | Admit: 2020-10-23 | Discharge: 2020-10-23 | Disposition: A | Discharge status: Home / Self Care | Payer: PPO | Source: Ambulatory Visit | Attending: Obstetrics and Gynecology | Admitting: Obstetrics and Gynecology

## 2020-10-23 ENCOUNTER — Other Ambulatory Visit: Payer: Self-pay

## 2020-10-23 ENCOUNTER — Ambulatory Visit: Payer: PPO

## 2020-10-23 DIAGNOSIS — N644 Mastodynia: Secondary | ICD-10-CM | POA: Diagnosis not present

## 2020-10-23 DIAGNOSIS — R922 Inconclusive mammogram: Secondary | ICD-10-CM | POA: Diagnosis not present

## 2020-11-07 ENCOUNTER — Encounter: Payer: Self-pay | Admitting: Internal Medicine

## 2020-11-07 ENCOUNTER — Ambulatory Visit (INDEPENDENT_AMBULATORY_CARE_PROVIDER_SITE_OTHER): Payer: PPO | Admitting: Internal Medicine

## 2020-11-07 ENCOUNTER — Other Ambulatory Visit: Payer: Self-pay

## 2020-11-07 VITALS — BP 138/80 | HR 67 | Temp 98.9°F | Wt 186.2 lb

## 2020-11-07 DIAGNOSIS — Z6835 Body mass index (BMI) 35.0-35.9, adult: Secondary | ICD-10-CM | POA: Diagnosis not present

## 2020-11-07 DIAGNOSIS — E669 Obesity, unspecified: Secondary | ICD-10-CM | POA: Diagnosis not present

## 2020-11-07 DIAGNOSIS — E559 Vitamin D deficiency, unspecified: Secondary | ICD-10-CM | POA: Diagnosis not present

## 2020-11-07 DIAGNOSIS — B079 Viral wart, unspecified: Secondary | ICD-10-CM

## 2020-11-07 DIAGNOSIS — I10 Essential (primary) hypertension: Secondary | ICD-10-CM | POA: Diagnosis not present

## 2020-11-07 DIAGNOSIS — E1169 Type 2 diabetes mellitus with other specified complication: Secondary | ICD-10-CM

## 2020-11-07 LAB — COMPREHENSIVE METABOLIC PANEL
ALT: 28 U/L (ref 0–35)
AST: 23 U/L (ref 0–37)
Albumin: 4.4 g/dL (ref 3.5–5.2)
Alkaline Phosphatase: 60 U/L (ref 39–117)
BUN: 10 mg/dL (ref 6–23)
CO2: 28 mEq/L (ref 19–32)
Calcium: 9.9 mg/dL (ref 8.4–10.5)
Chloride: 105 mEq/L (ref 96–112)
Creatinine, Ser: 0.71 mg/dL (ref 0.40–1.20)
GFR: 88.31 mL/min (ref >60.00)
Glucose, Bld: 67 mg/dL — ABNORMAL LOW (ref 70–99)
Potassium: 4.6 mEq/L (ref 3.5–5.1)
Sodium: 139 mEq/L (ref 135–145)
Total Bilirubin: 0.4 mg/dL (ref 0.2–1.2)
Total Protein: 7.4 g/dL (ref 6.0–8.3)

## 2020-11-07 LAB — VITAMIN D 25 HYDROXY (VIT D DEFICIENCY, FRACTURES): VITD: 32.41 ng/mL (ref 30.00–100.00)

## 2020-11-07 LAB — HEMOGLOBIN A1C: Hgb A1c MFr Bld: 6.3 % (ref 4.6–6.5)

## 2020-11-07 NOTE — Assessment & Plan Note (Addendum)
BP Readings from Last 3 Encounters:  11/07/20 138/80  08/06/20 112/78  06/06/20 (!) 146/78    Amlodipine, Clonidine prn and Bystolic

## 2020-11-07 NOTE — Progress Notes (Signed)
Subjective:  Patient ID: Denise Donaldson Current, female    DOB: 06-Sep-1956  Age: 65 y.o. MRN: [id number]  CC: Follow-up (3 month f/u)   HPI Denise Donaldson presents for HTN, DM, GERD f/u C/o sleeping a lot ("all my life")  Outpatient Medications Prior to Visit  Medication Sig Dispense Refill  . amLODipine (NORVASC) 2.5 MG tablet Take 1 tablet (2.5 mg total) by mouth 2 (two) times daily. 180 tablet 3  . b complex vitamins tablet Take 1 tablet by mouth daily. 100 tablet 3  . Cholecalciferol (VITAMIN D3) 50 MCG (2000 UT) capsule Take 1 capsule (2,000 Units total) by mouth daily. 100 capsule 3  . cloNIDine (CATAPRES) 0.1 MG tablet Take 1 tablet (0.1 mg total) by mouth 2 (two) times daily as needed (for blood pressure over 180). 60 tablet 1  . metFORMIN (GLUCOPHAGE) 500 MG tablet Take 1 tablet (500 mg total) by mouth daily with breakfast. 90 tablet 3  . nebivolol (BYSTOLIC) 5 MG tablet Take 1 tablet (5 mg total) by mouth in the morning and at bedtime. 180 tablet 3  . pantoprazole (PROTONIX) 40 MG tablet TAKE 1 TABLET BY MOUTH EVERY DAY (Patient taking differently: Take 40 mg by mouth as needed.) 90 tablet 1  . triamcinolone (KENALOG) 0.025 % ointment Apply topically 2 (two) times daily. 60 g 2   No facility-administered medications prior to visit.    ROS: Review of Systems  Constitutional: Negative for activity change, appetite change, chills, fatigue and unexpected weight change.  HENT: Negative for congestion, mouth sores and sinus pressure.   Eyes: Negative for visual disturbance.  Respiratory: Negative for cough and chest tightness.   Gastrointestinal: Negative for abdominal pain and nausea.  Genitourinary: Negative for difficulty urinating, frequency and vaginal pain.  Musculoskeletal: Negative for back pain and gait problem.  Skin: Negative for pallor and rash.  Neurological: Negative for dizziness, tremors, weakness, numbness and headaches.  Psychiatric/Behavioral: Positive  for sleep disturbance. Negative for confusion and suicidal ideas.    Objective:  BP 138/80 (BP Location: Left Arm)   Pulse 67   Temp 98.9 F (37.2 C) (Oral)   Wt 186 lb 3.2 oz (84.5 kg)   SpO2 96%   BMI 34.06 kg/m   BP Readings from Last 3 Encounters:  11/07/20 138/80  08/06/20 112/78  06/06/20 (!) 146/78    Wt Readings from Last 3 Encounters:  11/07/20 186 lb 3.2 oz (84.5 kg)  08/06/20 186 lb 9.6 oz (84.6 kg)  06/06/20 192 lb (87.1 kg)    Physical Exam Constitutional:      General: She is not in acute distress.    Appearance: She is well-developed. She is obese.  HENT:     Head: Normocephalic.     Right Ear: External ear normal.     Left Ear: External ear normal.     Nose: Nose normal.     Mouth/Throat:     Mouth: Oropharynx is clear and moist.  Eyes:     General:        Right eye: No discharge.        Left eye: No discharge.     Conjunctiva/sclera: Conjunctivae normal.     Pupils: Pupils are equal, round, and reactive to light.  Neck:     Thyroid: No thyromegaly.     Vascular: No JVD.     Trachea: No tracheal deviation.  Cardiovascular:     Rate and Rhythm: Normal rate and regular rhythm.     Heart  sounds: Normal heart sounds.  Pulmonary:     Effort: No respiratory distress.     Breath sounds: No stridor. No wheezing.  Abdominal:     General: Bowel sounds are normal. There is no distension.     Palpations: Abdomen is soft. There is no mass.     Tenderness: There is no abdominal tenderness. There is no guarding or rebound.  Musculoskeletal:        General: No tenderness or edema.     Cervical back: Normal range of motion and neck supple.  Lymphadenopathy:     Cervical: No cervical adenopathy.  Skin:    Findings: No erythema or rash.  Neurological:     Cranial Nerves: No cranial nerve deficit.     Motor: No abnormal muscle tone.     Coordination: Coordination normal.     Deep Tendon Reflexes: Reflexes normal.  Psychiatric:        Mood and Affect:  Mood and affect normal.        Behavior: Behavior normal.        Thought Content: Thought content normal.        Judgment: Judgment normal.     Lab Results  Component Value Date   WBC 7.5 04/11/2020   HGB 15.7 (H) 04/11/2020   HCT 46.2 (H) 04/11/2020   PLT 424 (H) 04/11/2020   GLUCOSE 93 08/06/2020   CHOL 207 (H) 11/24/2017   TRIG 125.0 11/24/2017   HDL 69.30 11/24/2017   LDLCALC 113 (H) 11/24/2017   ALT 35 (H) 04/11/2020   AST 25 04/11/2020   NA 139 08/06/2020   K 3.7 08/06/2020   CL 106 08/06/2020   CREATININE 0.67 08/06/2020   BUN 11 08/06/2020   CO2 25 08/06/2020   TSH 2.57 04/11/2020   HGBA1C 6.2 08/06/2020    MM DIAG BREAST TOMO BILATERAL  Result Date: 10/23/2020 CLINICAL DATA:  Diffuse intermittent lateral right breast pain. EXAM: DIGITAL DIAGNOSTIC BILATERAL MAMMOGRAM WITH TOMO AND CAD TECHNIQUE: Bilateral digital diagnostic mammography and breast tomosynthesis was performed. Digital images of the breasts were evaluated with computer-aided detection. COMPARISON:  Previous exam(s). ACR Breast Density Category b: There are scattered areas of fibroglandular density. FINDINGS: No suspicious masses, calcifications, or distortion are identified in either breast. IMPRESSION: No mammographic evidence of malignancy. RECOMMENDATION: Annual screening mammography. I have discussed the findings and recommendations with the patient. If applicable, a reminder letter will be sent to the patient regarding the next appointment. BI-RADS CATEGORY  1: Negative. Electronically Signed   By: [name] M.D   On: 10/23/2020 10:49    Assessment & Plan:     Walker Kehr, MD

## 2020-11-07 NOTE — Assessment & Plan Note (Addendum)
Wt Readings from Last 3 Encounters:  11/07/20 186 lb 3.2 oz (84.5 kg)  08/06/20 186 lb 9.6 oz (84.6 kg)  06/06/20 192 lb (87.1 kg)   Ref to [facility]

## 2020-11-07 NOTE — Assessment & Plan Note (Signed)
On Metformin 

## 2020-11-07 NOTE — Assessment & Plan Note (Signed)
We can do cryo

## 2020-11-07 NOTE — Patient Instructions (Signed)
Rogaine for women

## 2021-02-05 ENCOUNTER — Other Ambulatory Visit: Payer: Self-pay

## 2021-02-05 ENCOUNTER — Ambulatory Visit (INDEPENDENT_AMBULATORY_CARE_PROVIDER_SITE_OTHER): Payer: PPO | Admitting: Family Medicine

## 2021-02-05 ENCOUNTER — Encounter (INDEPENDENT_AMBULATORY_CARE_PROVIDER_SITE_OTHER): Payer: Self-pay | Admitting: Family Medicine

## 2021-02-05 VITALS — BP 143/76 | HR 60 | Temp 97.7°F | Ht 62.0 in | Wt 187.0 lb

## 2021-02-05 DIAGNOSIS — Z1331 Encounter for screening for depression: Secondary | ICD-10-CM

## 2021-02-05 DIAGNOSIS — R5383 Other fatigue: Secondary | ICD-10-CM | POA: Diagnosis not present

## 2021-02-05 DIAGNOSIS — E669 Obesity, unspecified: Secondary | ICD-10-CM

## 2021-02-05 DIAGNOSIS — Z6834 Body mass index (BMI) 34.0-34.9, adult: Secondary | ICD-10-CM

## 2021-02-05 DIAGNOSIS — R7303 Prediabetes: Secondary | ICD-10-CM

## 2021-02-05 DIAGNOSIS — Z0289 Encounter for other administrative examinations: Secondary | ICD-10-CM

## 2021-02-05 DIAGNOSIS — R0602 Shortness of breath: Secondary | ICD-10-CM

## 2021-02-05 DIAGNOSIS — E538 Deficiency of other specified B group vitamins: Secondary | ICD-10-CM

## 2021-02-05 DIAGNOSIS — I10 Essential (primary) hypertension: Secondary | ICD-10-CM

## 2021-02-05 DIAGNOSIS — E559 Vitamin D deficiency, unspecified: Secondary | ICD-10-CM | POA: Diagnosis not present

## 2021-02-06 LAB — TSH: TSH: 1.87 u[IU]/mL (ref 0.450–4.500)

## 2021-02-06 LAB — LIPID PANEL WITH LDL/HDL RATIO
Cholesterol, Total: 230 mg/dL — ABNORMAL HIGH (ref 100–199)
HDL: 75 mg/dL (ref >39)
LDL Chol Calc (NIH): 132 mg/dL — ABNORMAL HIGH (ref 0–99)
LDL/HDL Ratio: 1.8 ratio (ref 0.0–3.2)
Triglycerides: 133 mg/dL (ref 0–149)
VLDL Cholesterol Cal: 23 mg/dL (ref 5–40)

## 2021-02-06 LAB — CBC WITH DIFFERENTIAL
Basophils Absolute: 0.1 10*3/uL (ref 0.0–0.2)
Basos: 1 %
EOS (ABSOLUTE): 0.2 10*3/uL (ref 0.0–0.4)
Eos: 3 %
Hematocrit: 43.5 % (ref 34.0–46.6)
Hemoglobin: 14.8 g/dL (ref 11.1–15.9)
Immature Grans (Abs): 0 10*3/uL (ref 0.0–0.1)
Immature Granulocytes: 0 %
Lymphocytes Absolute: 2.4 10*3/uL (ref 0.7–3.1)
Lymphs: 42 %
MCH: 30.7 pg (ref 26.6–33.0)
MCHC: 34 g/dL (ref 31.5–35.7)
MCV: 90 fL (ref 79–97)
Monocytes Absolute: 0.5 10*3/uL (ref 0.1–0.9)
Monocytes: 9 %
Neutrophils Absolute: 2.7 10*3/uL (ref 1.4–7.0)
Neutrophils: 45 %
RBC: 4.82 x10E6/uL (ref 3.77–5.28)
RDW: 12.8 % (ref 11.7–15.4)
WBC: 5.8 10*3/uL (ref 3.4–10.8)

## 2021-02-06 LAB — COMPREHENSIVE METABOLIC PANEL
ALT: 30 IU/L (ref 0–32)
AST: 21 IU/L (ref 0–40)
Albumin/Globulin Ratio: 1.6 (ref 1.2–2.2)
Albumin: 4.4 g/dL (ref 3.8–4.8)
Alkaline Phosphatase: 70 IU/L (ref 44–121)
BUN/Creatinine Ratio: 16 (ref 12–28)
BUN: 10 mg/dL (ref 8–27)
Bilirubin Total: 0.3 mg/dL (ref 0.0–1.2)
CO2: 19 mmol/L — ABNORMAL LOW (ref 20–29)
Calcium: 9.5 mg/dL (ref 8.7–10.3)
Chloride: 104 mmol/L (ref 96–106)
Creatinine, Ser: 0.61 mg/dL (ref 0.57–1.00)
Globulin, Total: 2.8 g/dL (ref 1.5–4.5)
Glucose: 110 mg/dL — ABNORMAL HIGH (ref 65–99)
Potassium: 4.9 mmol/L (ref 3.5–5.2)
Sodium: 140 mmol/L (ref 134–144)
Total Protein: 7.2 g/dL (ref 6.0–8.5)
eGFR: 98 mL/min/{1.73_m2} (ref >59)

## 2021-02-06 LAB — VITAMIN B12: Vitamin B-12: 525 pg/mL (ref 232–1245)

## 2021-02-06 LAB — T4: T4, Total: 8.7 ug/dL (ref 4.5–12.0)

## 2021-02-06 LAB — T3: T3, Total: 157 ng/dL (ref 71–180)

## 2021-02-06 LAB — FOLATE: Folate: 19.4 ng/mL (ref >3.0)

## 2021-02-06 LAB — HEMOGLOBIN A1C
Est. average glucose Bld gHb Est-mCnc: 134 mg/dL
Hgb A1c MFr Bld: 6.3 % — ABNORMAL HIGH (ref 4.8–5.6)

## 2021-02-06 LAB — VITAMIN D 25 HYDROXY (VIT D DEFICIENCY, FRACTURES): Vit D, 25-Hydroxy: 27.1 ng/mL — ABNORMAL LOW (ref 30.0–100.0)

## 2021-02-06 LAB — INSULIN, RANDOM: INSULIN: 16.3 u[IU]/mL (ref 2.6–24.9)

## 2021-02-06 NOTE — Progress Notes (Signed)
Chief Complaint:   OBESITY Denise Donaldson (MR# [id number]) is a 65 y.o. female who presents for evaluation and treatment of obesity and related comorbidities. Current BMI is Body mass index is 34.2 kg/m. Denise Donaldson has been struggling with her weight for many years and has been unsuccessful in either losing weight, maintaining weight loss, or reaching her healthy weight goal.  Denise Donaldson has been eating Vegetarian for 3+ years. She feels her weight gain is due to multiple rounds of prednisone for poison ivy.  Denise Donaldson is currently in the action stage of change and ready to dedicate time achieving and maintaining a healthier weight. Denise Donaldson is interested in becoming our patient and working on intensive lifestyle modifications including (but not limited to) diet and exercise for weight loss.  Kit's habits were reviewed today and are as follows: Her family eats meals together, she started gaining weight 5 years ago, she has significant food cravings issues, she skips meals frequently and she is trying to follow a vegetarian diet.  Depression Screen Denise Donaldson's Food and Mood (modified PHQ-9) score was 4.  Depression screen [facility] 2/9 02/05/2021  Decreased Interest 0  Down, Depressed, Hopeless 0  PHQ - 2 Score 0  Altered sleeping 0  Tired, decreased energy 0  Change in appetite 0  Feeling bad or failure about yourself  0  Trouble concentrating 0  Moving slowly or fidgety/restless 0  Suicidal thoughts 0  PHQ-9 Score 0  Difficult doing work/chores Not difficult at all   Subjective:   1. Other fatigue Denise Donaldson denies daytime somnolence and denies waking up still tired. Gricel generally gets 8 hours of sleep per night, and states that she has generally restful sleep. Snoring is present. Apneic episodes are not present. Epworth Sleepiness Score is 0.  2. Vitamin D deficiency Denise Donaldson has a history of low Vit D, and she notes fatigue.  3. Essential hypertension Denise Donaldson is with a  history of TIA, and her blood pressure is elevated today. She is working on weight loss.  4. Pre-diabetes Denise Donaldson's last A1c was 6.3, even on metformin. She has a family history of diabetes mellitus, and she notes polyphagia.  5. B12 deficiency Denise Donaldson is at high risk of B12 deficiency. She is due to her dieting preferences.  Assessment/Plan:   1. Other fatigue Denise Donaldson does not feel that her weight is causing her energy to be lower than it should be. Fatigue may be related to obesity, depression or many other causes. Labs will be ordered, and in the meanwhile, Denise Donaldson will focus on self care including making healthy food choices, increasing physical activity and focusing on stress reduction.  - Insulin, random - TSH - Lipid Panel With LDL/HDL Ratio - Folate - T3 - T4 - CBC With Differential - EKG 12-Lead  2. Vitamin D deficiency Low Vitamin D level contributes to fatigue and are associated with obesity, breast, and colon cancer. We will check labs today. Truc will follow-up for routine testing of Vitamin D, at least 2-3 times per year to avoid over-replacement.  - VITAMIN D 25 Hydroxy (Vit-D Deficiency, Fractures)  3. Essential hypertension Denise Donaldson will continue working on healthy weight loss and exercise to improve blood pressure control. We will check labs today, and will watch for signs of hypotension as she continues her lifestyle modifications.  - Comprehensive metabolic panel  4. Pre-diabetes Denise Donaldson will continue to work on weight loss, exercise, and decreasing simple carbohydrates to help decrease the risk of diabetes. We will check labs today.  -  Insulin, random - Hemoglobin A1c  5. B12 deficiency The diagnosis was reviewed with the patient. We will check labs today. Denise Donaldson will continue to follow up as directed. Orders and follow up as documented in patient record.  - Vitamin B12  6. Screening for depression Denise Donaldson had a negative depression  screening. Depression is commonly associated with obesity and often results in emotional eating behaviors. We will monitor this closely and work on CBT to help improve the non-hunger eating patterns. Referral to Psychology may be required if no improvement is seen as she continues in our clinic.  7. Obesity with current BMI 34.3 Denise Donaldson is currently in the action stage of change and her goal is to continue with weight loss efforts. I recommend Denise Donaldson begin the structured treatment plan as follows:  She has agreed to keeping a food journal and adhering to recommended goals of 1100-1300 calories and 75+ grams of protein daily or the [place].  Denise Donaldson was given a vegetarian plan, but she states she wont eat a lot of the foods on the plan.  Exercise goals: No exercise has been prescribed for now, while we concentrate on nutritional changes.  Behavioral modification strategies: keeping a strict food journal.  She was informed of the importance of frequent follow-up visits to maximize her success with intensive lifestyle modifications for her multiple health conditions. She was informed we would discuss her lab results at her next visit unless there is a critical issue that needs to be addressed sooner. Denise Donaldson agreed to keep her next visit at the agreed upon time to discuss these results.  Objective:   Blood pressure (!) 143/76, pulse 60, temperature 97.7 F (36.5 C), height 5\' 2"  (1.575 m), weight 187 lb (84.8 kg), SpO2 97 %. Body mass index is 34.2 kg/m.  EKG: Normal sinus rhythm, rate 60 BPM.  Indirect Calorimeter completed today shows a VO2 of 218 and a REE of 1518.  Her calculated basal metabolic rate is 0160 thus her basal metabolic rate is better than expected.  General: Cooperative, alert, well developed, in no acute distress. HEENT: Conjunctivae and lids unremarkable. Cardiovascular: Regular rhythm.  Lungs: Normal work of breathing. Neurologic: No focal deficits.   Lab  Results  Component Value Date   CREATININE 0.61 02/05/2021   BUN 10 02/05/2021   NA 140 02/05/2021   K 4.9 02/05/2021   CL 104 02/05/2021   CO2 19 (L) 02/05/2021   Lab Results  Component Value Date   ALT 30 02/05/2021   AST 21 02/05/2021   ALKPHOS 70 02/05/2021   BILITOT 0.3 02/05/2021   Lab Results  Component Value Date   HGBA1C 6.3 (H) 02/05/2021   HGBA1C 6.3 11/07/2020   HGBA1C 6.2 08/06/2020   HGBA1C 6.2 (H) 04/11/2020   Lab Results  Component Value Date   INSULIN 16.3 02/05/2021   Lab Results  Component Value Date   TSH 1.870 02/05/2021   Lab Results  Component Value Date   CHOL 230 (H) 02/05/2021   HDL 75 02/05/2021   LDLCALC 132 (H) 02/05/2021   TRIG 133 02/05/2021   CHOLHDL 3 11/24/2017   Lab Results  Component Value Date   WBC 5.8 02/05/2021   HGB 14.8 02/05/2021   HCT 43.5 02/05/2021   MCV 90 02/05/2021   PLT 424 (H) 04/11/2020   Lab Results  Component Value Date   IRON 72 11/24/2017   TIBC 356 11/24/2017   FERRITIN 88 11/24/2017   Attestation Statements:   Reviewed by  clinician on day of visit: allergies, medications, problem list, medical history, surgical history, family history, social history, and previous encounter notes.  Time spent on visit including pre-visit chart review and post-visit charting and care was 60 minutes.    I, Trixie Dredge, am acting as transcriptionist for Dennard Nip, MD.  I have reviewed the above documentation for accuracy and completeness, and I agree with the above. - Dennard Nip, MD

## 2021-02-19 ENCOUNTER — Other Ambulatory Visit: Payer: Self-pay

## 2021-02-19 ENCOUNTER — Encounter (INDEPENDENT_AMBULATORY_CARE_PROVIDER_SITE_OTHER): Payer: Self-pay | Admitting: Family Medicine

## 2021-02-19 ENCOUNTER — Ambulatory Visit (INDEPENDENT_AMBULATORY_CARE_PROVIDER_SITE_OTHER): Payer: PPO | Admitting: Family Medicine

## 2021-02-19 VITALS — BP 145/78 | HR 68 | Temp 98.3°F | Ht 62.0 in | Wt 188.0 lb

## 2021-02-19 DIAGNOSIS — Z6834 Body mass index (BMI) 34.0-34.9, adult: Secondary | ICD-10-CM

## 2021-02-19 DIAGNOSIS — E669 Obesity, unspecified: Secondary | ICD-10-CM

## 2021-02-19 DIAGNOSIS — E559 Vitamin D deficiency, unspecified: Secondary | ICD-10-CM | POA: Diagnosis not present

## 2021-02-19 DIAGNOSIS — R7303 Prediabetes: Secondary | ICD-10-CM

## 2021-02-19 DIAGNOSIS — E7849 Other hyperlipidemia: Secondary | ICD-10-CM

## 2021-02-19 DIAGNOSIS — E7800 Pure hypercholesterolemia, unspecified: Secondary | ICD-10-CM

## 2021-02-19 MED ORDER — VITAMIN D (ERGOCALCIFEROL) 1.25 MG (50000 UNIT) PO CAPS: 50000.0000 [IU] | ORAL_CAPSULE | ORAL | 0 refills | Status: DC

## 2021-02-25 NOTE — Progress Notes (Signed)
Chief Complaint:   OBESITY Denise Donaldson is here to discuss her progress with her obesity treatment plan along with follow-up of her obesity related diagnoses. Denise Donaldson is on keeping a food journal and adhering to recommended goals of 1100-1300 calories and 75+ grams of protein daily and states she is following her eating plan approximately 50% of the time. Denise Donaldson states she is doing 0 minutes 0 times per week.  Today's visit was #: 2 Starting weight: 187 lbs Starting date: 02/05/2021 Today's weight: 188 lbs Today's date: 02/19/2021 Total lbs lost to date: 0 Total lbs lost since last in-office visit: 0  Interim History: Denise Donaldson was instructed to keep a food journal or follow our vegetarian plan, but she struggled to do either of these. Instead she has tried to eat healthy foods and portion control, but unfortunately this has failed to cause weight loss. She is struggling to meet even half of her protein.  Subjective:   1. Hyperlipidemia, pure Chrishelle's LDL is elevated but her HDL and triglycerides are within normal limits. She wonders if this is genetic and how she can find this out. She eats mostly vegetarian. She is not interested in starting a statin.  2. Vitamin D deficiency Lap[place] is on Vit D 2,000 IU daily.   3. Pre-diabetes Denise Donaldson's A1c is 6.3 and she is close to developing diabetes mellitus. She is already on metformin, but her levels are still not controlled.  Assessment/Plan:   1. Hyperlipidemia, pure Cardiovascular risk and specific lipid/LDL goals reviewed. We discussed several lifestyle modifications today. I will refer Denise Donaldson to genetics as she is adamant that she wants to find out. She will continue to work on diet, exercise and weight loss efforts. Orders and follow up as documented in patient record.   - Ambulatory referral to Genetics  2. Vitamin D deficiency Low Vitamin D level contributes to fatigue and are associated with obesity, breast, and colon  cancer. Denise Donaldson agreed to start prescription Vitamin D 50,000 IU every week with no refills, and she will continue Vit D 2,000 IU daily. She will follow-up for routine testing of Vitamin D, at least 2-3 times per year to avoid over-replacement.  - Vitamin D, Ergocalciferol, (DRISDOL) 1.25 MG (50000 UNIT) CAPS capsule; Take 1 capsule (50,000 Units total) by mouth every 7 (seven) days.  Dispense: 4 capsule; Refill: 0  3. Pre-diabetes Denise Donaldson was offered a GLP-1, but she declined to start this, and she decided to stop her metformin as she feels.   4. Obesity with current BMI 34.5 Denise Donaldson is not currently in the action stage of change. As such, her goal is to continue with weight loss efforts. She has agreed to practicing portion control and making smarter food choices, such as increasing vegetables and decreasing simple carbohydrates.   Denise Donaldson was offered many options to increase her protein intake including vegetarian and vegan options, but she disliked most of my suggestions. I do think the patient is in the active stage of change right now.  Behavioral modification strategies: increasing water intake and decreasing sodium intake.  Denise Donaldson has agreed to follow-up with our clinic in 2 to 3 weeks. She was informed of the importance of frequent follow-up visits to maximize her success with intensive lifestyle modifications for her multiple health conditions.   Objective:   Blood pressure (!) 145/78, pulse 68, temperature 98.3 F (36.8 C), height 5\' 2"  (1.575 m), weight 188 lb (85.3 kg), SpO2 96 %. Body mass index is 34.39 kg/m.  General:  Cooperative, alert, well developed, in no acute distress. HEENT: Conjunctivae and lids unremarkable. Cardiovascular: Regular rhythm.  Lungs: Normal work of breathing. Neurologic: No focal deficits.   Lab Results  Component Value Date   CREATININE 0.61 02/05/2021   BUN 10 02/05/2021   NA 140 02/05/2021   K 4.9 02/05/2021   CL 104 02/05/2021   CO2  19 (L) 02/05/2021   Lab Results  Component Value Date   ALT 30 02/05/2021   AST 21 02/05/2021   ALKPHOS 70 02/05/2021   BILITOT 0.3 02/05/2021   Lab Results  Component Value Date   HGBA1C 6.3 (H) 02/05/2021   HGBA1C 6.3 11/07/2020   HGBA1C 6.2 08/06/2020   HGBA1C 6.2 (H) 04/11/2020   Lab Results  Component Value Date   INSULIN 16.3 02/05/2021   Lab Results  Component Value Date   TSH 1.870 02/05/2021   Lab Results  Component Value Date   CHOL 230 (H) 02/05/2021   HDL 75 02/05/2021   LDLCALC 132 (H) 02/05/2021   TRIG 133 02/05/2021   CHOLHDL 3 11/24/2017   Lab Results  Component Value Date   WBC 5.8 02/05/2021   HGB 14.8 02/05/2021   HCT 43.5 02/05/2021   MCV 90 02/05/2021   PLT 424 (H) 04/11/2020   Lab Results  Component Value Date   IRON 72 11/24/2017   TIBC 356 11/24/2017   FERRITIN 88 11/24/2017   Attestation Statements:   Reviewed by clinician on day of visit: allergies, medications, problem list, medical history, surgical history, family history, social history, and previous encounter notes.  Time spent on visit including pre-visit chart review and post-visit care and charting was 45 minutes.    I, Trixie Dredge, am acting as transcriptionist for Dennard Nip, MD.  I have reviewed the above documentation for accuracy and completeness, and I agree with the above. -  Dennard Nip, MD

## 2021-03-10 ENCOUNTER — Other Ambulatory Visit: Payer: Self-pay

## 2021-03-10 ENCOUNTER — Ambulatory Visit (INDEPENDENT_AMBULATORY_CARE_PROVIDER_SITE_OTHER): Payer: PPO | Admitting: Internal Medicine

## 2021-03-10 ENCOUNTER — Encounter: Payer: Self-pay | Admitting: Internal Medicine

## 2021-03-10 DIAGNOSIS — I10 Essential (primary) hypertension: Secondary | ICD-10-CM

## 2021-03-10 DIAGNOSIS — E669 Obesity, unspecified: Secondary | ICD-10-CM

## 2021-03-10 DIAGNOSIS — E1169 Type 2 diabetes mellitus with other specified complication: Secondary | ICD-10-CM

## 2021-03-10 DIAGNOSIS — Z6835 Body mass index (BMI) 35.0-35.9, adult: Secondary | ICD-10-CM | POA: Diagnosis not present

## 2021-03-10 DIAGNOSIS — E559 Vitamin D deficiency, unspecified: Secondary | ICD-10-CM

## 2021-03-10 NOTE — Assessment & Plan Note (Signed)
Continue Metformin 

## 2021-03-10 NOTE — Assessment & Plan Note (Signed)
Cont w/Amlodipine, Clonidine prn and Bystolic

## 2021-03-10 NOTE — Progress Notes (Signed)
Subjective:  Patient ID: Denise Donaldson Current, female    DOB: 01-Oct-1955  Age: 65 y.o. MRN: [id number]  CC: Follow-up (4 month f/u)   HPI Mimie Maulden presents for HTN, DM, Vit D def  Outpatient Medications Prior to Visit  Medication Sig Dispense Refill   amLODipine (NORVASC) 2.5 MG tablet Take 1 tablet (2.5 mg total) by mouth 2 (two) times daily. 180 tablet 3   b complex vitamins tablet Take 1 tablet by mouth daily. 100 tablet 3   Cholecalciferol (VITAMIN D3) 50 MCG (2000 UT) capsule Take 1 capsule (2,000 Units total) by mouth daily. 100 capsule 3   cloNIDine (CATAPRES) 0.1 MG tablet Take 1 tablet (0.1 mg total) by mouth 2 (two) times daily as needed (for blood pressure over 180). 60 tablet 1   metFORMIN (GLUCOPHAGE) 500 MG tablet Take 1 tablet (500 mg total) by mouth daily with breakfast. 90 tablet 3   nebivolol (BYSTOLIC) 5 MG tablet Take 1 tablet (5 mg total) by mouth in the morning and at bedtime. 180 tablet 3   pantoprazole (PROTONIX) 40 MG tablet Take 40 mg by mouth daily. PRN     Vitamin D, Ergocalciferol, (DRISDOL) 1.25 MG (50000 UNIT) CAPS capsule Take 1 capsule (50,000 Units total) by mouth every 7 (seven) days. 4 capsule 0   triamcinolone (KENALOG) 0.025 % ointment Apply topically 2 (two) times daily. (Patient not taking: Reported on 03/10/2021) 60 g 2   No facility-administered medications prior to visit.    ROS: Review of Systems  Constitutional:  Negative for activity change, appetite change, chills, fatigue and unexpected weight change.  HENT:  Negative for congestion, mouth sores and sinus pressure.   Eyes:  Negative for visual disturbance.  Respiratory:  Negative for cough and chest tightness.   Gastrointestinal:  Negative for abdominal pain and nausea.  Genitourinary:  Negative for difficulty urinating, frequency and vaginal pain.  Musculoskeletal:  Negative for back pain and gait problem.  Skin:  Negative for pallor and rash.  Neurological:  Negative for  dizziness, tremors, weakness, numbness and headaches.  Psychiatric/Behavioral:  Negative for confusion and sleep disturbance.    Objective:  BP 120/68 (BP Location: Left Arm)   Pulse 75   Temp 98.2 F (36.8 C) (Oral)   Ht 5\' 2"  (1.575 m)   Wt 188 lb (85.3 kg)   SpO2 97%   BMI 34.39 kg/m   BP Readings from Last 3 Encounters:  03/10/21 120/68  02/19/21 (!) 145/78  02/05/21 (!) 143/76    Wt Readings from Last 3 Encounters:  03/10/21 188 lb (85.3 kg)  02/19/21 188 lb (85.3 kg)  02/05/21 187 lb (84.8 kg)    Physical Exam Constitutional:      General: She is not in acute distress.    Appearance: She is well-developed. She is obese.  HENT:     Head: Normocephalic.     Right Ear: External ear normal.     Left Ear: External ear normal.     Nose: Nose normal.  Eyes:     General:        Right eye: No discharge.        Left eye: No discharge.     Conjunctiva/sclera: Conjunctivae normal.     Pupils: Pupils are equal, round, and reactive to light.  Neck:     Thyroid: No thyromegaly.     Vascular: No JVD.     Trachea: No tracheal deviation.  Cardiovascular:     Rate and Rhythm: Normal  rate and regular rhythm.     Heart sounds: Normal heart sounds.  Pulmonary:     Effort: No respiratory distress.     Breath sounds: No stridor. No wheezing.  Abdominal:     General: Bowel sounds are normal. There is no distension.     Palpations: Abdomen is soft. There is no mass.     Tenderness: There is no abdominal tenderness. There is no guarding or rebound.  Musculoskeletal:        General: No tenderness.     Cervical back: Normal range of motion and neck supple. No rigidity.  Lymphadenopathy:     Cervical: No cervical adenopathy.  Skin:    Findings: No erythema or rash.  Neurological:     Mental Status: She is oriented to person, place, and time.     Cranial Nerves: No cranial nerve deficit.     Motor: No abnormal muscle tone.     Coordination: Coordination normal.     Deep  Tendon Reflexes: Reflexes normal.  Psychiatric:        Behavior: Behavior normal.        Thought Content: Thought content normal.        Judgment: Judgment normal.    Lab Results  Component Value Date   WBC 5.8 02/05/2021   HGB 14.8 02/05/2021   HCT 43.5 02/05/2021   PLT 424 (H) 04/11/2020   GLUCOSE 110 (H) 02/05/2021   CHOL 230 (H) 02/05/2021   TRIG 133 02/05/2021   HDL 75 02/05/2021   LDLCALC 132 (H) 02/05/2021   ALT 30 02/05/2021   AST 21 02/05/2021   NA 140 02/05/2021   K 4.9 02/05/2021   CL 104 02/05/2021   CREATININE 0.61 02/05/2021   BUN 10 02/05/2021   CO2 19 (L) 02/05/2021   TSH 1.870 02/05/2021   HGBA1C 6.3 (H) 02/05/2021    MM DIAG BREAST TOMO BILATERAL  Result Date: 10/23/2020 CLINICAL DATA:  Diffuse intermittent lateral right breast pain. EXAM: DIGITAL DIAGNOSTIC BILATERAL MAMMOGRAM WITH TOMO AND CAD TECHNIQUE: Bilateral digital diagnostic mammography and breast tomosynthesis was performed. Digital images of the breasts were evaluated with computer-aided detection. COMPARISON:  Previous exam(s). ACR Breast Density Category b: There are scattered areas of fibroglandular density. FINDINGS: No suspicious masses, calcifications, or distortion are identified in either breast. IMPRESSION: No mammographic evidence of malignancy. RECOMMENDATION: Annual screening mammography. I have discussed the findings and recommendations with the patient. If applicable, a reminder letter will be sent to the patient regarding the next appointment. BI-RADS CATEGORY  1: Negative. Electronically Signed   By: [name] M.D   On: 10/23/2020 10:49    Assessment & Plan:    Walker Kehr, MD

## 2021-03-10 NOTE — Patient Instructions (Addendum)
The Obesity Code book by Sharman Cheek   These suggestions will probably help you to improve your metabolism if you are not overweight and to lose weight if you are overweight: 1.  Reduce your consumption of sugars and starches.  Eliminate high fructose corn syrup from your diet.  Reduce your consumption of processed foods.  For desserts try to have seasonal fruits, berries, nuts, cheeses or dark chocolate with more than 70% cacao. 2.  Do not snack 3.  You do not have to eat breakfast.  If you choose to have breakfast - eat plain greek yogurt, eggs, oatmeal (without sugar) - use honey if you need to. 4.  Drink water, freshly brewed unsweetened tea (green, black or herbal) or coffee.  Do not drink sodas including diet sodas , juices, beverages sweetened with artificial sweeteners. 5.  Reduce your consumption of refined grains. 6.  Avoid protein drinks such as Optifast, Slim fast etc. Eat chicken, fish, meat, dairy and beans for your sources of protein. 7.  Natural unprocessed fats like cold pressed virgin olive oil, butter, coconut oil are good for you.  Eat avocados. 8.  Increase your consumption of fiber.  Fruits, berries, vegetables, whole grains, flaxseed, chia seeds, beans, popcorn, nuts, oatmeal are good sources of fiber 9.  Use vinegar in your diet, i.e. apple cider vinegar, red wine or balsamic vinegar 10.  You can try fasting.  For example you can skip breakfast and lunch every other day (24-hour fast) 11.  Stress reduction, good night sleep, relaxation, meditation, yoga and other physical activity is likely to help you to maintain low weight too. 12.  If you drink alcohol, limit your alcohol intake to no more than 2 drinks a day.   Cabbage soup recipe that will not make you gain weight: Take 1 small head of cabbage, 1 average pack of celery, 4 green peppers, 4 onions, 2 cans diced tomatoes (they are not available without salt), salt and spices to taste.  Chop cabbage, celery, peppers and  onions.  And tomatoes and 2-2.5 liters (2.5 quarts) of water so that it would just cover the vegetables.  Bring to boil.  Add spices and salt.  Turn heat to low/medium and simmer for 20-25 minutes.  Naturally, you can make a smaller batch and change some of the ingredients.

## 2021-03-10 NOTE — Assessment & Plan Note (Signed)
Wt Readings from Last 3 Encounters:  03/10/21 188 lb (85.3 kg)  02/19/21 188 lb (85.3 kg)  02/05/21 187 lb (84.8 kg)

## 2021-03-10 NOTE — Assessment & Plan Note (Signed)
On Vit D 

## 2021-03-12 ENCOUNTER — Ambulatory Visit (INDEPENDENT_AMBULATORY_CARE_PROVIDER_SITE_OTHER): Payer: PPO | Admitting: Adult Health

## 2021-04-01 ENCOUNTER — Ambulatory Visit: Payer: PPO | Admitting: Genetic Counselor

## 2021-04-01 ENCOUNTER — Other Ambulatory Visit: Payer: Self-pay

## 2021-04-12 NOTE — Progress Notes (Signed)
Referring Provider: Dennard Nip, MD   Referral Reason Chania Dimitriyeva was referred for genetic consult and testing of familial hypercholesterolemia (FH).  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 She reports having normal LDL-C levels in the past. However, her last lipid panel showed LDL-C of 132.  Risk Factors Markela denies having hypothyroidism, renal or hepatic dysfunction or diabetes that can also lead to elevated LDL-C   Family history Samreen (III.1) has three healthy sons, ages 55 (IV.1), 72 (IV.2) and 32 (IV.3). Her younger sister, age 6 (III.2) lives in [place] and is in good health.   There is no history of heart attacks or sudden death in her family. Her father (II.1) died in his sleep at age 78. An autopsy confirmed lung disease as cause of death. He had several siblings- many died of cancer but a few did live to 70s. Paternal grandfather (I.1) died of prostate cancer at age 64 and grandmother is suspected to have died from a stroke at age 40. She was found dead on the floor two days later.  Her mother (II.2) had gall stones and died before surgery could be done. One brother (II.3) was out drinking- he fell down and was found frozen to death. A sister (II.5) died at 34 from a diabetic coma. She is unaware of the cause of death in her other siblings (II.4, II.6). Maternal grandfather (I.3) died at 41 in WW2 and grandmother (I.40 died at 28.  Genetic Consultation Notes  We walked through the risk factors that can lead to hypercholesterolemia. I explained to Markeisha that the characteristic features of a genetic condition include absence of risk factors that can lead to elevated LDL-C, early age of presentation, increased disease severity and family history of the  condition. The clinical manifestations of FH were also reviewed. She denies having xanthomas, corneal arcus or a heart attack.  I discussed the molecular pathogenesis of FH. I informed her that [place] is primarily caused by pathogenic variants in three genes, namely APOB, LDLR and PCSK9. These pathogenic variants impact LDLR synthesis, degradation and recycling in cells and results in elevated LDL-C levels. We then walked through autosomal dominant inheritance pattern and viewed pedigree of families with heterozygous FH (HeFH) and homozygous FH (HoFH). Explained to her that digenic or compound heterozygous mutations in APOB, LDLR and PCSK9 genes can cause HoFH.   We reviewed the likely outcomes of FH genetic testing. Based on the diagnostic criteria for FH, yields can range from 50%-90%. A positive yield is observed in  ~63% of patients with a definite clinical diagnosis of FH. A negative test does not exclude a genetic basis for FH. In some cases, polygenic inheritance, where more than an average number of common variants, each with small effect, are known to increase plasma lipid levels. Additionally, limitations in current genetic testing methodology can produce a negative result. Variants of unknown significance (VUS) can be seen in some cases. I explained that typically a VUS is so classified if the variant is not well understood as very few individuals have been reported to harbor this variant or its role in gene function has not been elucidated. Screening other first-degree family members by genetic testing was also discussed. Additionally, we briefly touched upon the molecular basis of the different treatment modalities that are currently available.  Impression  and Plan  In summary, Luma has elevated LDL-C of 132 and no history of heart attacks in the absence of typical risk factors for hypercholesterolemia. Based on her personal and family history it is highly likely that she has polygenic  inheritance for hyperlipidemia resulting in LDL-C values of 132. Hence, there is a high likelihood that she may have a negative result. Kalyiah is reluctant to proceed with FH genetic testing. It is recommended that she get regular lipid screening and pursue appropriate treatment and lifestyle changes to reduce her LDL-C. She is agreeable with this.  Please note that the patient has not been counseled in this visit on personal, cultural or ethical issues that she may face due to her heart condition.    Lattie Corns, Ph.D, [facility] Clinical Molecular Geneticist

## 2021-05-02 DIAGNOSIS — H43811 Vitreous degeneration, right eye: Secondary | ICD-10-CM | POA: Diagnosis not present

## 2021-05-02 DIAGNOSIS — H5213 Myopia, bilateral: Secondary | ICD-10-CM | POA: Diagnosis not present

## 2021-05-02 DIAGNOSIS — H2513 Age-related nuclear cataract, bilateral: Secondary | ICD-10-CM | POA: Diagnosis not present

## 2021-06-09 ENCOUNTER — Other Ambulatory Visit: Payer: Self-pay

## 2021-06-10 ENCOUNTER — Ambulatory Visit: Payer: PPO | Admitting: Internal Medicine

## 2021-06-25 ENCOUNTER — Ambulatory Visit (INDEPENDENT_AMBULATORY_CARE_PROVIDER_SITE_OTHER): Payer: PPO

## 2021-06-25 ENCOUNTER — Other Ambulatory Visit: Payer: Self-pay

## 2021-06-25 VITALS — BP 120/78 | HR 76 | Temp 98.3°F | Ht 62.0 in | Wt 188.4 lb

## 2021-06-25 DIAGNOSIS — Z0000 Encounter for general adult medical examination without abnormal findings: Secondary | ICD-10-CM

## 2021-06-25 NOTE — Patient Instructions (Signed)
Denise Donaldson , Thank you for taking time to come for your Medicare Wellness Visit. I appreciate your ongoing commitment to your health goals. Please review the following plan we discussed and let me know if I can assist you in the future.   Screening recommendations/referrals: Colonoscopy: 04/27/2012; due very 10 years (due 04/27/2022) Mammogram: 10/23/2020; due every year Bone Density: never done Recommended yearly ophthalmology/optometry visit for glaucoma screening and checkup Recommended yearly dental visit for hygiene and checkup  Vaccinations: Influenza vaccine: declined Pneumococcal vaccine: declined Tdap vaccine: 06/01/2016; due every 10 years (due 06/01/2026) Shingles vaccine: never done   Covid-19: 12/01/2019, 12/25/2019, 08/04/2020  Advanced directives: Advance directive discussed with you today. Even though you declined this today please call our office should you change your mind and we can give you the proper paperwork for you to fill out.  Conditions/risks identified: Yes; Client understands the importance of follow-up with providers by attending scheduled visits and discussed goals to eat healthier, increase physical activity, exercise the brain, socialize more, get enough sleep and make time for laughter.  Next appointment: Please schedule your next Medicare Wellness Visit with your Nurse Health Advisor in 1 year by calling [phone].   Preventive Care 67 Years and Older, Female Preventive care refers to lifestyle choices and visits with your health care provider that can promote health and wellness. What does preventive care include? A yearly physical exam. This is also called an annual well check. Dental exams once or twice a year. Routine eye exams. Ask your health care provider how often you should have your eyes checked. Personal lifestyle choices, including: Daily care of your teeth and gums. Regular physical activity. Eating a healthy diet. Avoiding tobacco and  drug use. Limiting alcohol use. Practicing safe sex. Taking low-dose aspirin every day. Taking vitamin and mineral supplements as recommended by your health care provider. What happens during an annual well check? The services and screenings done by your health care provider during your annual well check will depend on your age, overall health, lifestyle risk factors, and family history of disease. Counseling  Your health care provider may ask you questions about your: Alcohol use. Tobacco use. Drug use. Emotional well-being. Home and relationship well-being. Sexual activity. Eating habits. History of falls. Memory and ability to understand (cognition). Work and work Statistician. Reproductive health. Screening  You may have the following tests or measurements: Height, weight, and BMI. Blood pressure. Lipid and cholesterol levels. These may be checked every 5 years, or more frequently if you are over 83 years old. Skin check. Lung cancer screening. You may have this screening every year starting at age 38 if you have a 30-pack-year history of smoking and currently smoke or have quit within the past 15 years. Fecal occult blood test (FOBT) of the stool. You may have this test every year starting at age 76. Flexible sigmoidoscopy or colonoscopy. You may have a sigmoidoscopy every 5 years or a colonoscopy every 10 years starting at age 75. Hepatitis C blood test. Hepatitis B blood test. Sexually transmitted disease (STD) testing. Diabetes screening. This is done by checking your blood sugar (glucose) after you have not eaten for a while (fasting). You may have this done every 1-3 years. Bone density scan. This is done to screen for osteoporosis. You may have this done starting at age 8. Mammogram. This may be done every 1-2 years. Talk to your health care provider about how often you should have regular mammograms. Talk with your health care provider  about your test results, treatment  options, and if necessary, the need for more tests. Vaccines  Your health care provider may recommend certain vaccines, such as: Influenza vaccine. This is recommended every year. Tetanus, diphtheria, and acellular pertussis (Tdap, Td) vaccine. You may need a Td booster every 10 years. Zoster vaccine. You may need this after age 7. Pneumococcal 13-valent conjugate (PCV13) vaccine. One dose is recommended after age 28. Pneumococcal polysaccharide (PPSV23) vaccine. One dose is recommended after age 1. Talk to your health care provider about which screenings and vaccines you need and how often you need them. This information is not intended to replace advice given to you by your health care provider. Make sure you discuss any questions you have with your health care provider. Document Released: 10/04/2015 Document Revised: 05/27/2016 Document Reviewed: 07/09/2015 Elsevier Interactive Patient Education  2017 [place] Prevention in the Home Falls can cause injuries. They can happen to people of all ages. There are many things you can do to make your home safe and to help prevent falls. What can I do on the outside of my home? Regularly fix the edges of walkways and driveways and fix any cracks. Remove anything that might make you trip as you walk through a door, such as a raised step or threshold. Trim any bushes or trees on the path to your home. Use bright outdoor lighting. Clear any walking paths of anything that might make someone trip, such as rocks or tools. Regularly check to see if handrails are loose or broken. Make sure that both sides of any steps have handrails. Any raised decks and porches should have guardrails on the edges. Have any leaves, snow, or ice cleared regularly. Use sand or salt on walking paths during winter. Clean up any spills in your garage right away. This includes oil or grease spills. What can I do in the bathroom? Use night lights. Install grab  bars by the toilet and in the tub and shower. Do not use towel bars as grab bars. Use non-skid mats or decals in the tub or shower. If you need to sit down in the shower, use a plastic, non-slip stool. Keep the floor dry. Clean up any water that spills on the floor as soon as it happens. Remove soap buildup in the tub or shower regularly. Attach bath mats securely with double-sided non-slip rug tape. Do not have throw rugs and other things on the floor that can make you trip. What can I do in the bedroom? Use night lights. Make sure that you have a light by your bed that is easy to reach. Do not use any sheets or blankets that are too big for your bed. They should not hang down onto the floor. Have a firm chair that has side arms. You can use this for support while you get dressed. Do not have throw rugs and other things on the floor that can make you trip. What can I do in the kitchen? Clean up any spills right away. Avoid walking on wet floors. Keep items that you use a lot in easy-to-reach places. If you need to reach something above you, use a strong step stool that has a grab bar. Keep electrical cords out of the way. Do not use floor polish or wax that makes floors slippery. If you must use wax, use non-skid floor wax. Do not have throw rugs and other things on the floor that can make you trip. What can I do  with my stairs? Do not leave any items on the stairs. Make sure that there are handrails on both sides of the stairs and use them. Fix handrails that are broken or loose. Make sure that handrails are as long as the stairways. Check any carpeting to make sure that it is firmly attached to the stairs. Fix any carpet that is loose or worn. Avoid having throw rugs at the top or bottom of the stairs. If you do have throw rugs, attach them to the floor with carpet tape. Make sure that you have a light switch at the top of the stairs and the bottom of the stairs. If you do not have them,  ask someone to add them for you. What else can I do to help prevent falls? Wear shoes that: Do not have high heels. Have rubber bottoms. Are comfortable and fit you well. Are closed at the toe. Do not wear sandals. If you use a stepladder: Make sure that it is fully opened. Do not climb a closed stepladder. Make sure that both sides of the stepladder are locked into place. Ask someone to hold it for you, if possible. Clearly mark and make sure that you can see: Any grab bars or handrails. First and last steps. Where the edge of each step is. Use tools that help you move around (mobility aids) if they are needed. These include: Canes. Walkers. Scooters. Crutches. Turn on the lights when you go into a dark area. Replace any light bulbs as soon as they burn out. Set up your furniture so you have a clear path. Avoid moving your furniture around. If any of your floors are uneven, fix them. If there are any pets around you, be aware of where they are. Review your medicines with your doctor. Some medicines can make you feel dizzy. This can increase your chance of falling. Ask your doctor what other things that you can do to help prevent falls. This information is not intended to replace advice given to you by your health care provider. Make sure you discuss any questions you have with your health care provider. Document Released: 07/04/2009 Document Revised: 02/13/2016 Document Reviewed: 10/12/2014 Elsevier Interactive Patient Education  2017 [employer].

## 2021-06-25 NOTE — Progress Notes (Addendum)
Subjective:   Denise Donaldson is a 65 y.o. female who presents for Medicare Annual (Subsequent) preventive examination.  Review of Systems     Cardiac Risk Factors include: advanced age (>65men, >76 women);diabetes mellitus;family history of premature cardiovascular disease;hypertension;obesity (BMI >30kg/m2);sedentary lifestyle     Objective:    Today's Vitals   06/25/21 1309  BP: 120/78  Pulse: 76  Temp: 98.3 F (36.8 C)  SpO2: 96%  Weight: 188 lb 6.4 oz (85.5 kg)  Height: 5\' 2"  (1.575 m)  PainSc: 0-No pain   Body mass index is 34.46 kg/m.  Advanced Directives 06/25/2021 06/24/2020 03/06/2019 04/27/2012  Does Patient Have a Medical Advance Directive? No No No Patient does not have advance directive  Would patient like information on creating a medical advance directive? No - Patient declined No - Patient declined No - Patient declined -    Current Medications (verified) Outpatient Encounter Medications as of 06/25/2021  Medication Sig   amLODipine (NORVASC) 2.5 MG tablet Take 1 tablet (2.5 mg total) by mouth 2 (two) times daily.   b complex vitamins tablet Take 1 tablet by mouth daily.   Cholecalciferol (VITAMIN D3) 50 MCG (2000 UT) capsule Take 1 capsule (2,000 Units total) by mouth daily.   cloNIDine (CATAPRES) 0.1 MG tablet Take 1 tablet (0.1 mg total) by mouth 2 (two) times daily as needed (for blood pressure over 180).   nebivolol (BYSTOLIC) 5 MG tablet Take 1 tablet (5 mg total) by mouth in the morning and at bedtime.   pantoprazole (PROTONIX) 40 MG tablet Take 40 mg by mouth daily. PRN   Vitamin D, Ergocalciferol, (DRISDOL) 1.25 MG (50000 UNIT) CAPS capsule Take 1 capsule (50,000 Units total) by mouth every 7 (seven) days.   metFORMIN (GLUCOPHAGE) 500 MG tablet Take 1 tablet (500 mg total) by mouth daily with breakfast. (Patient not taking: Reported on 06/25/2021)   No facility-administered encounter medications on file as of 06/25/2021.    Allergies  (verified) Penicillins, Losartan, Metoprolol tartrate, Coreg [carvedilol], Lisinopril, and Penicillin g   History: Past Medical History:  Diagnosis Date   Chest pain    Costochondral chest pain 2005   Left   Diverticulosis    Foot fracture, right    Dr Louanne Skye   Foot swelling    GERD (gastroesophageal reflux disease)    Dr Olevia Perches, EGD 2011 WNL   Head ache    Helicobacter pylori gastritis    History of pneumonia    HTN (hypertension)    Hyperplastic colon polyp    Menopausal disorder    After 52 Dr Corinna Capra   Mini stroke    Numbness of lower limb    Numbness of upper limb    Obesity    Other fatigue    Pectus excavatum    Pre-diabetes    Spine pain    Venous insufficiency    Bilat LE   Vitamin D deficiency    Past Surgical History:  Procedure Laterality Date   APPENDECTOMY     BREAST EXCISIONAL BIOPSY     left breast excisional biopsy 1980   CARDIAC CATHETERIZATION  2006   had double vision past cath   CESAREAN SECTION     1 time   COLONOSCOPY  04/27/2012   Procedure: COLONOSCOPY;  Surgeon: Lafayette Dragon, MD;  Location: [facility];  Service: Endoscopy;  Laterality: N/A;   double vision  2006   past cardiac cath/ lasted 9 months and went away   ESOPHAGOGASTRODUODENOSCOPY  04/27/2012  Procedure: ESOPHAGOGASTRODUODENOSCOPY (EGD);  Surgeon: Lafayette Dragon, MD;  Location: [facility];  Service: Endoscopy;  Laterality: N/A;   OVARIAN CYST REMOVAL     Family History  Problem Relation Age of Onset   Prostate cancer Father    Heart disease Father    Hypertension Father    Heart failure Father    Hypertension Sister    Prostate cancer Paternal Grandfather 23   Colon cancer Paternal Uncle    Stomach cancer Paternal Uncle 74   Diabetes Mother    Diabetes Maternal Aunt    Heart disease Maternal Grandmother    Stroke Paternal Grandmother    Esophageal cancer Neg Hx    Liver cancer Neg Hx    Rectal cancer Neg Hx    Social History   Socioeconomic History   Marital  status: Married    Spouse name: Not on file   Number of children: 3   Years of education: Not on file   Highest education level: Not on file  Occupational History   Occupation: retired  Tobacco Use   Smoking status: Never   Smokeless tobacco: Never  Vaping Use   Vaping Use: Never used  Substance and Sexual Activity   Alcohol use: No   Drug use: No   Sexual activity: Yes    Birth control/protection: Post-menopausal  Other Topics Concern   Not on file  Social History Narrative   Daily Caffeine - 2/day   Regular Exercise -  NO   Social Determinants of Health   Financial Resource Strain: Low Risk    Difficulty of Paying Living Expenses: Not hard at all  Food Insecurity: No Food Insecurity   Worried About Charity fundraiser in the Last Year: Never true   [place] in the Last Year: Never true  Transportation Needs: No Transportation Needs   Lack of Transportation (Medical): No   Lack of Transportation (Non-Medical): No  Physical Activity: Inactive   Days of Exercise per Week: 0 days   Minutes of Exercise per Session: 0 min  Stress: No Stress Concern Present   Feeling of Stress : Not at all  Social Connections: Moderately Isolated   Frequency of Communication with Friends and Family: More than three times a week   Frequency of Social Gatherings with Friends and Family: Not on file   Attends Religious Services: Never   Marine scientist or Organizations: No   Attends Music therapist: Never   Marital Status: Married    Tobacco Counseling Counseling given: Not Answered   Clinical Intake:  Pre-visit preparation completed: Yes  Pain : No/denies pain Pain Score: 0-No pain     BMI - recorded: 34.46 Nutritional Status: BMI > 30  Obese Nutritional Risks: None Diabetes: Yes CBG done?: No Did pt. bring in CBG monitor from home?: No  How often do you need to have someone help you when you read instructions, pamphlets, or other written  materials from your doctor or pharmacy?: 1 - Never What is the last grade level you completed in school?: Master's Degree  Diabetic? yes  Interpreter Needed?: No  Information entered by :: Lisette Abu, LPN   Activities of Daily Living In your present state of health, do you have any difficulty performing the following activities: 06/25/2021  Hearing? N  Vision? N  Difficulty concentrating or making decisions? N  Walking or climbing stairs? N  Dressing or bathing? N  Doing errands, shopping? N  Conservation officer, nature and  eating ? N  Using the Toilet? N  In the past six months, have you accidently leaked urine? N  Do you have problems with loss of bowel control? N  Managing your Medications? N  Managing your Finances? N  Housekeeping or managing your Housekeeping? N  Some recent data might be hidden    Patient Care Team: Plotnikov, Evie Lacks, MD as PCP - General Troy Sine, MD (Cardiology) Mauri Pole, MD as Consulting Physician (Gastroenterology) Luberta Mutter, MD as Consulting Physician (Ophthalmology)  Indicate any recent Medical Services you may have received from other than [facility] providers in the past year (date may be approximate).     Assessment:   This is a routine wellness examination for [place].  Hearing/Vision screen Hearing Screening - Comments:: Patient denied any hearing difficulty.   No hearing aids.  Vision Screening - Comments:: Patient wears corrective glasses/contacts.  Eye exam done annually by: Luberta Mutter, MD.  Dietary issues and exercise activities discussed: Current Exercise Habits: The patient does not participate in regular exercise at present, Exercise limited by: None identified   Goals Addressed   None   Depression Screen PHQ 2/9 Scores 06/25/2021 02/05/2021 06/24/2020 04/11/2020 11/24/2017  PHQ - 2 Score 0 0 0 0 0  PHQ- 9 Score - 0 - - -    Fall Risk Fall Risk  06/25/2021 06/24/2020  Falls in the past year? 0 0  Number  falls in past yr: 0 0  Injury with Fall? 0 0  Risk for fall due to : No Fall Risks -  Follow up Falls evaluation completed -    FALL RISK PREVENTION PERTAINING TO THE HOME:  Any stairs in or around the home? Yes  If so, are there any without handrails? No  Home free of loose throw rugs in walkways, pet beds, electrical cords, etc? Yes  Adequate lighting in your home to reduce risk of falls? Yes   ASSISTIVE DEVICES UTILIZED TO PREVENT FALLS:  Life alert? No  Use of a cane, walker or w/c? No  Grab bars in the bathroom? No  Shower chair or bench in shower? No  Elevated toilet seat or a handicapped toilet? No   TIMED UP AND GO:  Was the test performed? Yes .  Length of time to ambulate 10 feet: 7 sec.   Gait steady and fast without use of assistive device  Cognitive Function: Normal cognitive status assessed by direct observation by this Nurse Health Advisor. No abnormalities found.          Immunizations Immunization History  Administered Date(s) Administered   PFIZER(Purple Top)SARS-COV-2 Vaccination 12/01/2019, 12/25/2019, 08/04/2020   Tdap 06/01/2016    TDAP status: Up to date  Flu Vaccine status: Declined, Education has been provided regarding the importance of this vaccine but patient still declined. Advised may receive this vaccine at local pharmacy or Health Dept. Aware to provide a copy of the vaccination record if obtained from local pharmacy or Health Dept. Verbalized acceptance and understanding.  Pneumococcal vaccine status: Declined,  Education has been provided regarding the importance of this vaccine but patient still declined. Advised may receive this vaccine at local pharmacy or Health Dept. Aware to provide a copy of the vaccination record if obtained from local pharmacy or Health Dept. Verbalized acceptance and understanding.   Covid-19 vaccine status: Completed vaccines  Qualifies for Shingles Vaccine? Yes   Zostavax completed No   Shingrix  Completed?: No.    Education has been provided regarding the  importance of this vaccine. Patient has been advised to call insurance company to determine out of pocket expense if they have not yet received this vaccine. Advised may also receive vaccine at local pharmacy or Health Dept. Verbalized acceptance and understanding.  Screening Tests Health Maintenance  Topic Date Due   FOOT EXAM  Never done   OPHTHALMOLOGY EXAM  Never done   Hepatitis C Screening  Never done   Zoster Vaccines- Shingrix (1 of 2) Never done   DEXA SCAN  Never done   COVID-19 Vaccine (4 - Booster for Pfizer series) 10/27/2020   INFLUENZA VACCINE  Never done   HEMOGLOBIN A1C  08/08/2021   COLONOSCOPY (Pts 45-30yrs Insurance coverage will need to be confirmed)  04/27/2022   MAMMOGRAM  10/23/2022   TETANUS/TDAP  06/01/2026   HPV VACCINES  Aged Out    Health Maintenance  Health Maintenance Due  Topic Date Due   FOOT EXAM  Never done   OPHTHALMOLOGY EXAM  Never done   Hepatitis C Screening  Never done   Zoster Vaccines- Shingrix (1 of 2) Never done   DEXA SCAN  Never done   COVID-19 Vaccine (4 - Booster for Pfizer series) 10/27/2020   INFLUENZA VACCINE  Never done    Colorectal cancer screening: Type of screening: Colonoscopy. Completed 04/27/2012. Repeat every 10 years  Mammogram status: Completed 10/23/2020. Repeat every year  Bone density status: never done  Lung Cancer Screening: (Low Dose CT Chest recommended if Age 59-80 years, 30 pack-year currently smoking OR have quit w/in 15years.) does not qualify.   Lung Cancer Screening Referral: no  Additional Screening:  Hepatitis C Screening: does not qualify; Completed no  Vision Screening: Recommended annual ophthalmology exams for early detection of glaucoma and other disorders of the eye. Is the patient up to date with their annual eye exam?  Yes  Who is the provider or what is the name of the office in which the patient attends annual eye exams?  Luberta Mutter, MD If pt is not established with a provider, would they like to be referred to a provider to establish care? No .   Dental Screening: Recommended annual dental exams for proper oral hygiene  Community Resource Referral / Chronic Care Management: CRR required this visit?  No   CCM required this visit?  No      Plan:     I have personally reviewed and noted the following in the patient's chart:   Medical and social history Use of alcohol, tobacco or illicit drugs  Current medications and supplements including opioid prescriptions.  Functional ability and status Nutritional status Physical activity Advanced directives List of other physicians Hospitalizations, surgeries, and ER visits in previous 12 months Vitals Screenings to include cognitive, depression, and falls Referrals and appointments  In addition, I have reviewed and discussed with patient certain preventive protocols, quality metrics, and best practice recommendations. A written personalized care plan for preventive services as well as general preventive health recommendations were provided to patient.     Sheral Flow, LPN   29/01/2840   Nurse Notes:  Hearing Screening - Comments:: Patient denied any hearing difficulty.   No hearing aids.  Vision Screening - Comments:: Patient wears corrective glasses/contacts.  Eye exam done annually by: Luberta Mutter, MD.    Medical screening examination/treatment/procedure(s) were performed by non-physician practitioner and as supervising physician I was immediately available for consultation/collaboration.  I agree with above. Lew Dawes, MD

## 2021-06-26 ENCOUNTER — Ambulatory Visit (INDEPENDENT_AMBULATORY_CARE_PROVIDER_SITE_OTHER): Payer: PPO | Admitting: Internal Medicine

## 2021-06-26 ENCOUNTER — Encounter: Payer: Self-pay | Admitting: Internal Medicine

## 2021-06-26 DIAGNOSIS — E1169 Type 2 diabetes mellitus with other specified complication: Secondary | ICD-10-CM

## 2021-06-26 DIAGNOSIS — E669 Obesity, unspecified: Secondary | ICD-10-CM | POA: Diagnosis not present

## 2021-06-26 DIAGNOSIS — Z6835 Body mass index (BMI) 35.0-35.9, adult: Secondary | ICD-10-CM

## 2021-06-26 DIAGNOSIS — E66812 Obesity, class 2: Secondary | ICD-10-CM

## 2021-06-26 DIAGNOSIS — K2100 Gastro-esophageal reflux disease with esophagitis, without bleeding: Secondary | ICD-10-CM | POA: Diagnosis not present

## 2021-06-26 DIAGNOSIS — E559 Vitamin D deficiency, unspecified: Secondary | ICD-10-CM | POA: Diagnosis not present

## 2021-06-26 DIAGNOSIS — I10 Essential (primary) hypertension: Secondary | ICD-10-CM

## 2021-06-26 DIAGNOSIS — R739 Hyperglycemia, unspecified: Secondary | ICD-10-CM | POA: Diagnosis not present

## 2021-06-26 NOTE — Assessment & Plan Note (Signed)
Clonidine prn  for very high BP episodes SBP>180 Cont w/Amlodipine, and Bystolic

## 2021-06-26 NOTE — Assessment & Plan Note (Signed)
Cont on Vit D 

## 2021-06-26 NOTE — Progress Notes (Signed)
Subjective:  Patient ID: Denise Donaldson Current, female    DOB: 11-01-1955  Age: 65 y.o. MRN: [id number]  CC: Follow-up (3 month f/u)   HPI Denise Donaldson presents for HTN, GERD, DM f/u.  She is complaining of difficulties with weight loss.  The patient states she does not eat much at all  Outpatient Medications Prior to Visit  Medication Sig Dispense Refill   amLODipine (NORVASC) 2.5 MG tablet Take 1 tablet (2.5 mg total) by mouth 2 (two) times daily. 180 tablet 3   b complex vitamins tablet Take 1 tablet by mouth daily. 100 tablet 3   Cholecalciferol (VITAMIN D3) 50 MCG (2000 UT) capsule Take 1 capsule (2,000 Units total) by mouth daily. 100 capsule 3   cloNIDine (CATAPRES) 0.1 MG tablet Take 1 tablet (0.1 mg total) by mouth 2 (two) times daily as needed (for blood pressure over 180). 60 tablet 1   nebivolol (BYSTOLIC) 5 MG tablet Take 1 tablet (5 mg total) by mouth in the morning and at bedtime. 180 tablet 3   pantoprazole (PROTONIX) 40 MG tablet Take 40 mg by mouth daily. PRN     metFORMIN (GLUCOPHAGE) 500 MG tablet Take 1 tablet (500 mg total) by mouth daily with breakfast. (Patient not taking: No sig reported) 90 tablet 3   Vitamin D, Ergocalciferol, (DRISDOL) 1.25 MG (50000 UNIT) CAPS capsule Take 1 capsule (50,000 Units total) by mouth every 7 (seven) days. (Patient not taking: Reported on 06/26/2021) 4 capsule 0   No facility-administered medications prior to visit.    ROS: Review of Systems  Constitutional:  Negative for activity change, appetite change, chills, fatigue and unexpected weight change.  HENT:  Negative for congestion, mouth sores and sinus pressure.   Eyes:  Negative for visual disturbance.  Respiratory:  Negative for cough and chest tightness.   Gastrointestinal:  Negative for abdominal pain and nausea.  Genitourinary:  Negative for difficulty urinating, frequency and vaginal pain.  Musculoskeletal:  Negative for back pain and gait problem.  Skin:  Negative  for pallor and rash.  Neurological:  Negative for dizziness, tremors, weakness, numbness and headaches.  Psychiatric/Behavioral:  Negative for confusion, sleep disturbance and suicidal ideas.    Objective:  BP 132/70 (BP Location: Left Arm)   Pulse 72   Temp 98.3 F (36.8 C) (Oral)   Ht 5\' 2"  (1.575 m)   Wt 187 lb 9.6 oz (85.1 kg)   SpO2 96%   BMI 34.31 kg/m   BP Readings from Last 3 Encounters:  06/26/21 132/70  06/25/21 120/78  03/10/21 120/68    Wt Readings from Last 3 Encounters:  06/26/21 187 lb 9.6 oz (85.1 kg)  06/25/21 188 lb 6.4 oz (85.5 kg)  03/10/21 188 lb (85.3 kg)    Physical Exam Constitutional:      General: She is not in acute distress.    Appearance: She is well-developed. She is obese.  HENT:     Head: Normocephalic.     Right Ear: External ear normal.     Left Ear: External ear normal.     Nose: Nose normal.  Eyes:     General:        Right eye: No discharge.        Left eye: No discharge.     Conjunctiva/sclera: Conjunctivae normal.     Pupils: Pupils are equal, round, and reactive to light.  Neck:     Thyroid: No thyromegaly.     Vascular: No JVD.  Trachea: No tracheal deviation.  Cardiovascular:     Rate and Rhythm: Normal rate and regular rhythm.     Heart sounds: Normal heart sounds.  Pulmonary:     Effort: No respiratory distress.     Breath sounds: No stridor. No wheezing.  Abdominal:     General: Bowel sounds are normal. There is no distension.     Palpations: Abdomen is soft. There is no mass.     Tenderness: There is no abdominal tenderness. There is no guarding or rebound.  Musculoskeletal:        General: No tenderness.     Cervical back: Normal range of motion and neck supple. No rigidity.  Lymphadenopathy:     Cervical: No cervical adenopathy.  Skin:    Findings: No erythema or rash.  Neurological:     Mental Status: She is oriented to person, place, and time.     Cranial Nerves: No cranial nerve deficit.     Motor:  No abnormal muscle tone.     Coordination: Coordination normal.     Deep Tendon Reflexes: Reflexes normal.  Psychiatric:        Behavior: Behavior normal.        Thought Content: Thought content normal.        Judgment: Judgment normal.    Lab Results  Component Value Date   WBC 5.8 02/05/2021   HGB 14.8 02/05/2021   HCT 43.5 02/05/2021   PLT 424 (H) 04/11/2020   GLUCOSE 110 (H) 02/05/2021   CHOL 230 (H) 02/05/2021   TRIG 133 02/05/2021   HDL 75 02/05/2021   LDLCALC 132 (H) 02/05/2021   ALT 30 02/05/2021   AST 21 02/05/2021   NA 140 02/05/2021   K 4.9 02/05/2021   CL 104 02/05/2021   CREATININE 0.61 02/05/2021   BUN 10 02/05/2021   CO2 19 (L) 02/05/2021   TSH 1.870 02/05/2021   HGBA1C 6.3 (H) 02/05/2021    MM DIAG BREAST TOMO BILATERAL  Result Date: 10/23/2020 CLINICAL DATA:  Diffuse intermittent lateral right breast pain. EXAM: DIGITAL DIAGNOSTIC BILATERAL MAMMOGRAM WITH TOMO AND CAD TECHNIQUE: Bilateral digital diagnostic mammography and breast tomosynthesis was performed. Digital images of the breasts were evaluated with computer-aided detection. COMPARISON:  Previous exam(s). ACR Breast Density Category b: There are scattered areas of fibroglandular density. FINDINGS: No suspicious masses, calcifications, or distortion are identified in either breast. IMPRESSION: No mammographic evidence of malignancy. RECOMMENDATION: Annual screening mammography. I have discussed the findings and recommendations with the patient. If applicable, a reminder letter will be sent to the patient regarding the next appointment. BI-RADS CATEGORY  1: Negative. Electronically Signed   By: [name] M.D   On: 10/23/2020 10:49    Assessment & Plan:

## 2021-06-26 NOTE — Assessment & Plan Note (Signed)
Cont w/Metformin 

## 2021-06-26 NOTE — Patient Instructions (Addendum)
[place] at [facility]  The Obesity Code book by Sharman Cheek   These suggestions will probably help you to improve your metabolism if you are not overweight and to lose weight if you are overweight: 1.  Reduce your consumption of sugars and starches.  Eliminate high fructose corn syrup from your diet.  Reduce your consumption of processed foods.  For desserts try to have seasonal fruits, berries, nuts, cheeses or dark chocolate with more than 70% cacao. 2.  Do not snack 3.  You do not have to eat breakfast.  If you choose to have breakfast - eat plain greek yogurt, eggs, oatmeal (without sugar) - use honey if you need to. 4.  Drink water, freshly brewed unsweetened tea (green, black or herbal) or coffee.  Do not drink sodas including diet sodas , juices, beverages sweetened with artificial sweeteners. 5.  Reduce your consumption of refined grains. 6.  Avoid protein drinks such as Optifast, Slim fast etc. Eat chicken, fish, meat, dairy and beans for your sources of protein. 7.  Natural unprocessed fats like cold pressed virgin olive oil, butter, coconut oil are good for you.  Eat avocados. 8.  Increase your consumption of fiber.  Fruits, berries, vegetables, whole grains, flaxseed, chia seeds, beans, popcorn, nuts, oatmeal are good sources of fiber 9.  Use vinegar in your diet, i.e. apple cider vinegar, red wine or balsamic vinegar 10.  You can try fasting.  For example you can skip breakfast and lunch every other day (24-hour fast) 11.  Stress reduction, good night sleep, relaxation, meditation, yoga and other physical activity is likely to help you to maintain low weight too. 12.  If you drink alcohol, limit your alcohol intake to no more than 2 drinks a day.

## 2021-06-26 NOTE — Assessment & Plan Note (Addendum)
Cont on Metformin Mounjaro options discussed

## 2021-06-27 NOTE — Assessment & Plan Note (Signed)
-

## 2021-06-27 NOTE — Assessment & Plan Note (Signed)
The patient will try intermittent fasting.  Discussed.  She is not interested in [place].  Information provided.

## 2021-07-08 ENCOUNTER — Telehealth (INDEPENDENT_AMBULATORY_CARE_PROVIDER_SITE_OTHER): Payer: PPO | Admitting: Internal Medicine

## 2021-07-08 DIAGNOSIS — J069 Acute upper respiratory infection, unspecified: Secondary | ICD-10-CM | POA: Diagnosis not present

## 2021-07-08 MED ORDER — HYDROCODONE BIT-HOMATROP MBR 5-1.5 MG/5ML PO SOLN
5.0000 mL | Freq: Four times a day (QID) | ORAL | 0 refills | Status: DC | PRN
Start: 2021-07-08 — End: 2021-08-07

## 2021-07-08 MED ORDER — AZITHROMYCIN 250 MG PO TABS: ORAL_TABLET | ORAL | 0 refills | Status: DC

## 2021-07-08 NOTE — Progress Notes (Signed)
Virtual Visit via Telephone Note  I connected with Denise Donaldson on 07/08/21 at 11:40 AM EDT by telephone and verified that I am speaking with the correct person using two identifiers.  Location: Patient: Home Provider: [place] Office No one else is in the visit   I discussed the limitations, risks, security and privacy concerns of performing an evaluation and management service by telephone and the availability of in person appointments. I also discussed with the patient that there may be a patient responsible charge related to this service. The patient expressed understanding and agreed to proceed.   History of Present Illness:   C/o URI sx's x 3 d: cough Observations/Objective:   Assessment and Plan: See plan  Follow Up Instructions:    I discussed the assessment and treatment plan with the patient. The patient was provided an opportunity to ask questions and all were answered. The patient agreed with the plan and demonstrated an understanding of the instructions.   The patient was advised to call back or seek an in-person evaluation if the symptoms worsen or if the condition fails to improve as anticipated.  I provided 15 minutes of non-face-to-face time during this encounter.   Walker Kehr, MD

## 2021-07-13 ENCOUNTER — Encounter: Payer: Self-pay | Admitting: Internal Medicine

## 2021-07-13 NOTE — Assessment & Plan Note (Signed)
New.  Likely bacterial.  Prescribed Z-Pak and Hycodan as needed.  Call if problems

## 2021-07-15 ENCOUNTER — Other Ambulatory Visit: Payer: Self-pay | Admitting: Internal Medicine

## 2021-07-15 ENCOUNTER — Telehealth: Payer: Self-pay | Admitting: Internal Medicine

## 2021-07-15 DIAGNOSIS — J069 Acute upper respiratory infection, unspecified: Secondary | ICD-10-CM

## 2021-07-15 NOTE — Telephone Encounter (Signed)
Patient had telephone visit w/ provider 10/18 due to fever..cough & congestion.. & chills  Patient says she has taken all meds dr provided but she is still not feeling well.. declined to do another telephone visit  Wants nurse to call w/ provider recommendations [phone]

## 2021-07-16 ENCOUNTER — Ambulatory Visit (INDEPENDENT_AMBULATORY_CARE_PROVIDER_SITE_OTHER): Payer: PPO

## 2021-07-16 ENCOUNTER — Other Ambulatory Visit: Payer: Self-pay

## 2021-07-16 DIAGNOSIS — J069 Acute upper respiratory infection, unspecified: Secondary | ICD-10-CM | POA: Diagnosis not present

## 2021-07-16 DIAGNOSIS — R059 Cough, unspecified: Secondary | ICD-10-CM | POA: Diagnosis not present

## 2021-07-16 NOTE — Telephone Encounter (Signed)
Notified pt w/ MD response../l b 

## 2021-07-16 NOTE — Telephone Encounter (Signed)
Pls do a CXR on 10/26 OV if not better  Thanks

## 2021-08-07 ENCOUNTER — Ambulatory Visit (INDEPENDENT_AMBULATORY_CARE_PROVIDER_SITE_OTHER): Payer: PPO | Admitting: Internal Medicine

## 2021-08-07 ENCOUNTER — Other Ambulatory Visit: Payer: Self-pay

## 2021-08-07 ENCOUNTER — Encounter: Payer: Self-pay | Admitting: Internal Medicine

## 2021-08-07 DIAGNOSIS — E669 Obesity, unspecified: Secondary | ICD-10-CM | POA: Diagnosis not present

## 2021-08-07 DIAGNOSIS — E559 Vitamin D deficiency, unspecified: Secondary | ICD-10-CM | POA: Diagnosis not present

## 2021-08-07 DIAGNOSIS — K2100 Gastro-esophageal reflux disease with esophagitis, without bleeding: Secondary | ICD-10-CM | POA: Diagnosis not present

## 2021-08-07 DIAGNOSIS — Z6835 Body mass index (BMI) 35.0-35.9, adult: Secondary | ICD-10-CM | POA: Diagnosis not present

## 2021-08-07 DIAGNOSIS — E1169 Type 2 diabetes mellitus with other specified complication: Secondary | ICD-10-CM

## 2021-08-07 DIAGNOSIS — L859 Epidermal thickening, unspecified: Secondary | ICD-10-CM

## 2021-08-07 MED ORDER — RYBELSUS 3 MG PO TABS: 3.0000 mg | ORAL_TABLET | Freq: Every day | ORAL | 3 refills | Status: DC

## 2021-08-07 NOTE — Assessment & Plan Note (Signed)
Cont on Vit D 

## 2021-08-07 NOTE — Patient Instructions (Signed)
Try GLYTONE exfoliating body lotion by Pierre Fabre (free acid value 17.5)  

## 2021-08-07 NOTE — Assessment & Plan Note (Signed)
Try GLYTONE exfoliating body lotion by Pierre Fabre (free acid value 17.5)  

## 2021-08-07 NOTE — Assessment & Plan Note (Signed)
Cont w/metformin (taking 3/week) - We can add Rybelsus

## 2021-08-07 NOTE — Assessment & Plan Note (Addendum)
Rubelsus Rx given to treat DM

## 2021-08-07 NOTE — Progress Notes (Signed)
Subjective:  Patient ID: Denise Donaldson, female    DOB: 05/23/1956  Age: 65 y.o. MRN: [id number]  CC: Follow-up (6 week f/u)   HPI Denise Donaldson presents for HTN, GERD, hyperglycemia Cough - resolved  Outpatient Medications Prior to Visit  Medication Sig Dispense Refill   amLODipine (NORVASC) 2.5 MG tablet Take 1 tablet (2.5 mg total) by mouth 2 (two) times daily. 180 tablet 3   b complex vitamins tablet Take 1 tablet by mouth daily. 100 tablet 3   Cholecalciferol (VITAMIN D3) 50 MCG (2000 UT) capsule Take 1 capsule (2,000 Units total) by mouth daily. 100 capsule 3   cloNIDine (CATAPRES) 0.1 MG tablet Take 1 tablet (0.1 mg total) by mouth 2 (two) times daily as needed (for blood pressure over 180). 60 tablet 1   metFORMIN (GLUCOPHAGE) 500 MG tablet Take 1 tablet by mouth once daily with breakfast 90 tablet 3   nebivolol (BYSTOLIC) 5 MG tablet Take 1 tablet (5 mg total) by mouth in the morning and at bedtime. 180 tablet 3   pantoprazole (PROTONIX) 40 MG tablet Take 40 mg by mouth daily. PRN     azithromycin (ZITHROMAX Z-PAK) 250 MG tablet As directed (Patient not taking: Reported on 08/07/2021) 6 tablet 0   HYDROcodone bit-homatropine (HYCODAN) 5-1.5 MG/5ML syrup Take 5 mLs by mouth every 6 (six) hours as needed for cough. (Patient not taking: Reported on 08/07/2021) 240 mL 0   No facility-administered medications prior to visit.    ROS: Review of Systems  Constitutional:  Negative for activity change, appetite change, chills, fatigue and unexpected weight change.  HENT:  Negative for congestion, mouth sores and sinus pressure.   Eyes:  Negative for visual disturbance.  Respiratory:  Negative for cough and chest tightness.   Gastrointestinal:  Negative for abdominal pain and nausea.  Genitourinary:  Negative for difficulty urinating, frequency and vaginal pain.  Musculoskeletal:  Negative for back pain and gait problem.  Skin:  Negative for pallor and rash.   Neurological:  Negative for dizziness, tremors, weakness, numbness and headaches.  Psychiatric/Behavioral:  Negative for confusion and sleep disturbance. The patient is not nervous/anxious.    Objective:  BP 120/62 (BP Location: Left Arm)   Pulse 65   Temp 98.5 F (36.9 C) (Oral)   Ht 5\' 2"  (1.575 m)   Wt 186 lb 6.4 oz (84.6 kg)   SpO2 96%   BMI 34.09 kg/m   BP Readings from Last 3 Encounters:  08/07/21 120/62  06/26/21 132/70  06/25/21 120/78    Wt Readings from Last 3 Encounters:  08/07/21 186 lb 6.4 oz (84.6 kg)  06/26/21 187 lb 9.6 oz (85.1 kg)  06/25/21 188 lb 6.4 oz (85.5 kg)    Physical Exam Constitutional:      General: She is not in acute distress.    Appearance: She is well-developed. She is obese.  HENT:     Head: Normocephalic.     Right Ear: External ear normal.     Left Ear: External ear normal.     Nose: Nose normal.  Eyes:     General:        Right eye: No discharge.        Left eye: No discharge.     Conjunctiva/sclera: Conjunctivae normal.     Pupils: Pupils are equal, round, and reactive to light.  Neck:     Thyroid: No thyromegaly.     Vascular: No JVD.     Trachea: No tracheal deviation.  Cardiovascular:     Rate and Rhythm: Normal rate and regular rhythm.     Heart sounds: Normal heart sounds.  Pulmonary:     Effort: No respiratory distress.     Breath sounds: No stridor. No wheezing.  Abdominal:     General: Bowel sounds are normal. There is no distension.     Palpations: Abdomen is soft. There is no mass.     Tenderness: There is no abdominal tenderness. There is no guarding or rebound.  Musculoskeletal:        General: Tenderness present.     Cervical back: Normal range of motion and neck supple. No rigidity.  Lymphadenopathy:     Cervical: No cervical adenopathy.  Skin:    Findings: No erythema or rash.  Neurological:     Cranial Nerves: No cranial nerve deficit.     Motor: No abnormal muscle tone.     Coordination:  Coordination normal.     Deep Tendon Reflexes: Reflexes normal.  Psychiatric:        Behavior: Behavior normal.        Thought Content: Thought content normal.        Judgment: Judgment normal.  Focal Hyperkeratosis on feet  Lab Results  Component Value Date   WBC 5.8 02/05/2021   HGB 14.8 02/05/2021   HCT 43.5 02/05/2021   PLT 424 (H) 04/11/2020   GLUCOSE 110 (H) 02/05/2021   CHOL 230 (H) 02/05/2021   TRIG 133 02/05/2021   HDL 75 02/05/2021   LDLCALC 132 (H) 02/05/2021   ALT 30 02/05/2021   AST 21 02/05/2021   NA 140 02/05/2021   K 4.9 02/05/2021   CL 104 02/05/2021   CREATININE 0.61 02/05/2021   BUN 10 02/05/2021   CO2 19 (L) 02/05/2021   TSH 1.870 02/05/2021   HGBA1C 6.3 (H) 02/05/2021    MM DIAG BREAST TOMO BILATERAL  Result Date: 10/23/2020 CLINICAL DATA:  Diffuse intermittent lateral right breast pain. EXAM: DIGITAL DIAGNOSTIC BILATERAL MAMMOGRAM WITH TOMO AND CAD TECHNIQUE: Bilateral digital diagnostic mammography and breast tomosynthesis was performed. Digital images of the breasts were evaluated with computer-aided detection. COMPARISON:  Previous exam(s). ACR Breast Density Category b: There are scattered areas of fibroglandular density. FINDINGS: No suspicious masses, calcifications, or distortion are identified in either breast. IMPRESSION: No mammographic evidence of malignancy. RECOMMENDATION: Annual screening mammography. I have discussed the findings and recommendations with the patient. If applicable, a reminder letter will be sent to the patient regarding the next appointment. BI-RADS CATEGORY  1: Negative. Electronically Signed   By: [name] M.D   On: 10/23/2020 10:49    Assessment & Plan:   Problem List Items Addressed This Visit     Diabetes mellitus type 2 in obese ([address])    Cont w/metformin (taking 3/week) - We can add Rybelsus       Relevant Medications   Semaglutide (RYBELSUS) 3 MG TABS   GERD    Continue with Protonix       Hyperkeratosis    Try GLYTONE exfoliating body lotion by Desmond Dike (free acid value 17.5)      Obesity    Rubelsus Rx given to treat DM      Relevant Medications   Semaglutide (RYBELSUS) 3 MG TABS   Vitamin D deficiency    Cont on Vit D         Meds ordered this encounter  Medications   Semaglutide (RYBELSUS) 3 MG TABS    Sig: Take  3 mg by mouth daily.    Dispense:  30 tablet    Refill:  3      Follow-up: Return in about 2 months (around 10/07/2021) for a follow-up visit.  Walker Kehr, MD

## 2021-08-25 NOTE — Assessment & Plan Note (Signed)
-

## 2021-10-09 ENCOUNTER — Other Ambulatory Visit: Payer: Self-pay

## 2021-10-09 ENCOUNTER — Encounter: Payer: Self-pay | Admitting: Internal Medicine

## 2021-10-09 ENCOUNTER — Ambulatory Visit (INDEPENDENT_AMBULATORY_CARE_PROVIDER_SITE_OTHER): Payer: PPO | Admitting: Internal Medicine

## 2021-10-09 VITALS — BP 136/80 | HR 73 | Temp 98.4°F | Ht 62.0 in | Wt 184.0 lb

## 2021-10-09 DIAGNOSIS — E1169 Type 2 diabetes mellitus with other specified complication: Secondary | ICD-10-CM

## 2021-10-09 DIAGNOSIS — E669 Obesity, unspecified: Secondary | ICD-10-CM

## 2021-10-09 DIAGNOSIS — I10 Essential (primary) hypertension: Secondary | ICD-10-CM | POA: Diagnosis not present

## 2021-10-09 DIAGNOSIS — Z6835 Body mass index (BMI) 35.0-35.9, adult: Secondary | ICD-10-CM

## 2021-10-09 DIAGNOSIS — L570 Actinic keratosis: Secondary | ICD-10-CM

## 2021-10-09 DIAGNOSIS — B078 Other viral warts: Secondary | ICD-10-CM

## 2021-10-09 NOTE — Progress Notes (Signed)
Subjective:  Patient ID: Denise Donaldson, female    DOB: 1956/03/25  Age: 66 y.o. MRN: [id number]  CC: No chief complaint on file.   HPI Denise Donaldson presents for HTN, DM, GERD f/u  Outpatient Medications Prior to Visit  Medication Sig Dispense Refill   amLODipine (NORVASC) 2.5 MG tablet Take 1 tablet (2.5 mg total) by mouth 2 (two) times daily. 180 tablet 3   b complex vitamins tablet Take 1 tablet by mouth daily. 100 tablet 3   Cholecalciferol (VITAMIN D3) 50 MCG (2000 UT) capsule Take 1 capsule (2,000 Units total) by mouth daily. 100 capsule 3   cloNIDine (CATAPRES) 0.1 MG tablet Take 1 tablet (0.1 mg total) by mouth 2 (two) times daily as needed (for blood pressure over 180). 60 tablet 1   metFORMIN (GLUCOPHAGE) 500 MG tablet Take 1 tablet by mouth once daily with breakfast 90 tablet 3   nebivolol (BYSTOLIC) 5 MG tablet Take 1 tablet (5 mg total) by mouth in the morning and at bedtime. 180 tablet 3   pantoprazole (PROTONIX) 40 MG tablet Take 40 mg by mouth daily. PRN     Semaglutide (RYBELSUS) 3 MG TABS Take 3 mg by mouth daily. 30 tablet 3   No facility-administered medications prior to visit.    ROS: Review of Systems  Constitutional:  Negative for activity change, appetite change, chills, fatigue and unexpected weight change.  HENT:  Negative for congestion, mouth sores and sinus pressure.   Eyes:  Negative for visual disturbance.  Respiratory:  Negative for cough and chest tightness.   Gastrointestinal:  Negative for abdominal pain and nausea.  Genitourinary:  Negative for difficulty urinating, frequency and vaginal pain.  Musculoskeletal:  Negative for back pain and gait problem.  Skin:  Negative for pallor and rash.  Neurological:  Negative for dizziness, tremors, weakness, numbness and headaches.  Psychiatric/Behavioral:  Negative for confusion and sleep disturbance.    Objective:  BP 136/80 (BP Location: Left Arm, Patient Position: Sitting, Cuff Size:  Large)    Pulse 73    Temp 98.4 F (36.9 C) (Oral)    Ht 5\' 2"  (1.575 m)    Wt 184 lb (83.5 kg)    SpO2 97%    BMI 33.65 kg/m   BP Readings from Last 3 Encounters:  10/09/21 136/80  08/07/21 120/62  06/26/21 132/70    Wt Readings from Last 3 Encounters:  10/09/21 184 lb (83.5 kg)  08/07/21 186 lb 6.4 oz (84.6 kg)  06/26/21 187 lb 9.6 oz (85.1 kg)    Physical Exam Constitutional:      General: She is not in acute distress.    Appearance: She is well-developed. She is obese.  HENT:     Head: Normocephalic.     Right Ear: External ear normal.     Left Ear: External ear normal.     Nose: Nose normal.  Eyes:     General:        Right eye: No discharge.        Left eye: No discharge.     Conjunctiva/sclera: Conjunctivae normal.     Pupils: Pupils are equal, round, and reactive to light.  Neck:     Thyroid: No thyromegaly.     Vascular: No JVD.     Trachea: No tracheal deviation.  Cardiovascular:     Rate and Rhythm: Normal rate and regular rhythm.     Heart sounds: Normal heart sounds.  Pulmonary:     Effort: No  respiratory distress.     Breath sounds: No stridor. No wheezing.  Abdominal:     General: Bowel sounds are normal. There is no distension.     Palpations: Abdomen is soft. There is no mass.     Tenderness: There is no abdominal tenderness. There is no guarding or rebound.  Musculoskeletal:        General: No tenderness.     Cervical back: Normal range of motion and neck supple. No rigidity.  Lymphadenopathy:     Cervical: No cervical adenopathy.  Skin:    Findings: No erythema or rash.  Neurological:     Mental Status: She is oriented to person, place, and time.     Cranial Nerves: No cranial nerve deficit.     Motor: No abnormal muscle tone.     Coordination: Coordination normal.     Deep Tendon Reflexes: Reflexes normal.  Psychiatric:        Behavior: Behavior normal.        Thought Content: Thought content normal.        Judgment: Judgment normal.    Warts on skin   Procedure Note :     Procedure : Cryosurgery   Indication:   warts   Risks including unsuccessful procedure , bleeding, infection, bruising, scar, a need for a repeat  procedure and others were explained to the patient in detail as well as the benefits. Informed consent was obtained verbally.    6 lesion(s)  on different areas was/were treated with liquid nitrogen on a Q-tip in a usual fasion . Band-Aid was applied and antibiotic ointment was given for a later use.   Tolerated well. Complications none.   Postprocedure instructions :     Keep the wounds clean. You can wash them with liquid soap and water. Pat dry with gauze or a Kleenex tissue  Before applying antibiotic ointment and a Band-Aid.   You need to report immediately  if  any signs of infection develop.   Lab Results  Component Value Date   WBC 5.8 02/05/2021   HGB 14.8 02/05/2021   HCT 43.5 02/05/2021   PLT 424 (H) 04/11/2020   GLUCOSE 110 (H) 02/05/2021   CHOL 230 (H) 02/05/2021   TRIG 133 02/05/2021   HDL 75 02/05/2021   LDLCALC 132 (H) 02/05/2021   ALT 30 02/05/2021   AST 21 02/05/2021   NA 140 02/05/2021   K 4.9 02/05/2021   CL 104 02/05/2021   CREATININE 0.61 02/05/2021   BUN 10 02/05/2021   CO2 19 (L) 02/05/2021   TSH 1.870 02/05/2021   HGBA1C 6.3 (H) 02/05/2021    MM DIAG BREAST TOMO BILATERAL  Result Date: 10/23/2020 CLINICAL DATA:  Diffuse intermittent lateral right breast pain. EXAM: DIGITAL DIAGNOSTIC BILATERAL MAMMOGRAM WITH TOMO AND CAD TECHNIQUE: Bilateral digital diagnostic mammography and breast tomosynthesis was performed. Digital images of the breasts were evaluated with computer-aided detection. COMPARISON:  Previous exam(s). ACR Breast Density Category b: There are scattered areas of fibroglandular density. FINDINGS: No suspicious masses, calcifications, or distortion are identified in either breast. IMPRESSION: No mammographic evidence of malignancy. RECOMMENDATION:  Annual screening mammography. I have discussed the findings and recommendations with the patient. If applicable, a reminder letter will be sent to the patient regarding the next appointment. BI-RADS CATEGORY  1: Negative. Electronically Signed   By: [name] M.D   On: 10/23/2020 10:49    Assessment & Plan:   Problem List Items Addressed This Visit  Actinic keratoses   Diabetes mellitus type 2 in obese ([facility]) - Primary    Cont w/metformin and w/Rybelsus.  Monitor A1c      Essential hypertension    Better controlled Continue with weight loss Continue with amlodipine, Bystolic, as needed clonidine      Obesity    Better Continue with Rybelsus      Warts    See cryo         No orders of the defined types were placed in this encounter.     Follow-up: Return in about 3 months (around 01/07/2022) for a follow-up visit.  Walker Kehr, MD

## 2021-10-09 NOTE — Assessment & Plan Note (Addendum)
Better Continue with Rybelsus

## 2021-10-09 NOTE — Assessment & Plan Note (Addendum)
Cont w/metformin and w/Rybelsus.  Monitor A1c

## 2021-10-14 NOTE — Assessment & Plan Note (Signed)
See cryo 

## 2021-10-14 NOTE — Assessment & Plan Note (Signed)
Better controlled Continue with weight loss Continue with amlodipine, Bystolic, as needed clonidine

## 2021-11-19 ENCOUNTER — Ambulatory Visit (INDEPENDENT_AMBULATORY_CARE_PROVIDER_SITE_OTHER): Payer: PPO | Admitting: Internal Medicine

## 2021-11-19 ENCOUNTER — Other Ambulatory Visit: Payer: Self-pay

## 2021-11-19 ENCOUNTER — Encounter: Payer: Self-pay | Admitting: Internal Medicine

## 2021-11-19 DIAGNOSIS — J069 Acute upper respiratory infection, unspecified: Secondary | ICD-10-CM

## 2021-11-19 MED ORDER — HYDROCODONE BIT-HOMATROP MBR 5-1.5 MG/5ML PO SOLN: 5.0000 mL | Freq: Three times a day (TID) | ORAL | 0 refills | Status: DC | PRN

## 2021-11-19 MED ORDER — LEVOFLOXACIN 500 MG PO TABS: 500.0000 mg | ORAL_TABLET | Freq: Every day | ORAL | 0 refills | Status: DC

## 2021-11-19 NOTE — Assessment & Plan Note (Addendum)
Acute bronchitis versus pneumonia.  Prescribed Levaquin for 7 days.  Hycodan cough syrup as needed.  Chest x-ray if not better ? ?

## 2021-11-19 NOTE — Progress Notes (Signed)
Virtual Visit via Video Note ? ?I connected with Denise Donaldson on 11/19/21 at  8:10 AM EST by a video enabled telemedicine application and verified that I am speaking with the correct person using two identifiers. ?  ?I discussed the limitations of evaluation and management by telemedicine and the availability of in person appointments. The patient expressed understanding and agreed to proceed. ? ?I was located at our [facility] office. ?The patient was at home. ?There was no one else present in the visit. ? ?Chief Complaint  ?Patient presents with  ? Cough  ? Fever  ?  2/27 negative COVID  ?  ? ?History of Present Illness: ? ?Cough, HA, weakness and fever x 2-3 d. C/o productive cough w/a lot of creamy mucus x 1 day. Can't sleep. COVID (-).  ?Review of Systems  ?HENT:  Positive for congestion. Negative for sore throat.   ?Respiratory:  Positive for cough and sputum production.   ?Psychiatric/Behavioral:  The patient is not nervous/anxious and does not have insomnia.   ? ? ?Observations/Objective: ?The patient appears to be in no acute distress.  She appears to be tired.  Her voice is hoarse.  She is coughing during the interview. ? ?Assessment and Plan: ? ?Problem List Items Addressed This Visit   ? ? Upper respiratory infection  ?  Acute bronchitis versus pneumonia.  Prescribed Levaquin for 7 days.  Hycodan cough syrup as needed.  Chest x-ray if not better ? ?  ?  ? ? ? ?Meds ordered this encounter  ?Medications  ? HYDROcodone bit-homatropine (HYCODAN) 5-1.5 MG/5ML syrup  ?  Sig: Take 5 mLs by mouth every 8 (eight) hours as needed for cough.  ?  Dispense:  240 mL  ?  Refill:  0  ? levofloxacin (LEVAQUIN) 500 MG tablet  ?  Sig: Take 1 tablet (500 mg total) by mouth daily.  ?  Dispense:  7 tablet  ?  Refill:  0  ?  ? ?Follow Up Instructions: ? ?  ?I discussed the assessment and treatment plan with the patient. The patient was provided an opportunity to ask questions and all were answered. The patient agreed  with the plan and demonstrated an understanding of the instructions. ?  ?The patient was advised to call back or seek an in-person evaluation if the symptoms worsen or if the condition fails to improve as anticipated. ? ?I provided face-to-face time during this encounter. We were at different locations. ? ? ?Walker Kehr, MD ? ?

## 2021-11-25 ENCOUNTER — Telehealth: Payer: Self-pay | Admitting: Internal Medicine

## 2021-11-25 DIAGNOSIS — R051 Acute cough: Secondary | ICD-10-CM

## 2021-11-25 NOTE — Telephone Encounter (Signed)
Pt states she was advised by provider to call office and schedule an xray due to upper resp infection ? ?Informed pt I could not locate notes pertaining to an xray ? ?Pt requesting a c/b ? ?

## 2021-11-27 NOTE — Telephone Encounter (Signed)
CXR ordered.  Thx

## 2021-11-28 ENCOUNTER — Ambulatory Visit (INDEPENDENT_AMBULATORY_CARE_PROVIDER_SITE_OTHER): Payer: PPO

## 2021-11-28 ENCOUNTER — Telehealth: Payer: Self-pay

## 2021-11-28 DIAGNOSIS — R051 Acute cough: Secondary | ICD-10-CM

## 2021-11-28 DIAGNOSIS — R059 Cough, unspecified: Secondary | ICD-10-CM | POA: Diagnosis not present

## 2021-11-28 NOTE — Telephone Encounter (Signed)
Pt states she has a cold. Its hard to breath cause she is congested and the meds given to her x1 week ago helped but did not work. ? ?**Pt is going to do her CXR today at [place]. ?

## 2022-01-08 ENCOUNTER — Ambulatory Visit (INDEPENDENT_AMBULATORY_CARE_PROVIDER_SITE_OTHER): Payer: PPO | Admitting: Internal Medicine

## 2022-01-08 ENCOUNTER — Encounter: Payer: Self-pay | Admitting: Internal Medicine

## 2022-01-08 VITALS — BP 120/78 | HR 67 | Temp 98.1°F | Ht 62.0 in | Wt 185.0 lb

## 2022-01-08 DIAGNOSIS — R739 Hyperglycemia, unspecified: Secondary | ICD-10-CM | POA: Diagnosis not present

## 2022-01-08 DIAGNOSIS — Z6835 Body mass index (BMI) 35.0-35.9, adult: Secondary | ICD-10-CM | POA: Diagnosis not present

## 2022-01-08 DIAGNOSIS — E669 Obesity, unspecified: Secondary | ICD-10-CM | POA: Diagnosis not present

## 2022-01-08 DIAGNOSIS — E1169 Type 2 diabetes mellitus with other specified complication: Secondary | ICD-10-CM | POA: Diagnosis not present

## 2022-01-08 DIAGNOSIS — I10 Essential (primary) hypertension: Secondary | ICD-10-CM | POA: Diagnosis not present

## 2022-01-08 DIAGNOSIS — K2100 Gastro-esophageal reflux disease with esophagitis, without bleeding: Secondary | ICD-10-CM

## 2022-01-08 DIAGNOSIS — Z0000 Encounter for general adult medical examination without abnormal findings: Secondary | ICD-10-CM

## 2022-01-08 MED ORDER — PANTOPRAZOLE SODIUM 40 MG PO TBEC: 40.0000 mg | DELAYED_RELEASE_TABLET | Freq: Every day | ORAL | 3 refills | Status: AC

## 2022-01-08 NOTE — Patient Instructions (Addendum)
Blue-Emu cream use 2-3 times a day ?Ice ? ?

## 2022-01-08 NOTE — Progress Notes (Signed)
? ?Subjective:  ?Patient ID: Denise Donaldson, female    DOB: Feb 15, 1956  Age: 66 y.o. MRN: [id number] ? ?CC: No chief complaint on file. ? ? ?HPI ?Cherissa Cappelli presents for GERD, HTN, DM ? ?Outpatient Medications Prior to Visit  ?Medication Sig Dispense Refill  ? amLODipine (NORVASC) 2.5 MG tablet Take 1 tablet (2.5 mg total) by mouth 2 (two) times daily. 180 tablet 3  ? b complex vitamins tablet Take 1 tablet by mouth daily. 100 tablet 3  ? Cholecalciferol (VITAMIN D3) 50 MCG (2000 UT) capsule Take 1 capsule (2,000 Units total) by mouth daily. 100 capsule 3  ? cloNIDine (CATAPRES) 0.1 MG tablet Take 1 tablet (0.1 mg total) by mouth 2 (two) times daily as needed (for blood pressure over 180). 60 tablet 1  ? HYDROcodone bit-homatropine (HYCODAN) 5-1.5 MG/5ML syrup Take 5 mLs by mouth every 8 (eight) hours as needed for cough. 240 mL 0  ? metFORMIN (GLUCOPHAGE) 500 MG tablet Take 1 tablet by mouth once daily with breakfast 90 tablet 3  ? nebivolol (BYSTOLIC) 5 MG tablet Take 1 tablet (5 mg total) by mouth in the morning and at bedtime. 180 tablet 3  ? levofloxacin (LEVAQUIN) 500 MG tablet Take 1 tablet (500 mg total) by mouth daily. 7 tablet 0  ? pantoprazole (PROTONIX) 40 MG tablet Take 40 mg by mouth daily. PRN    ? Semaglutide (RYBELSUS) 3 MG TABS Take 3 mg by mouth daily. (Patient not taking: Reported on 01/08/2022) 30 tablet 3  ? ?No facility-administered medications prior to visit.  ? ? ?ROS: ?Review of Systems  ?Constitutional:  Negative for activity change, appetite change, chills, fatigue and unexpected weight change.  ?HENT:  Negative for congestion, mouth sores and sinus pressure.   ?Eyes:  Negative for visual disturbance.  ?Respiratory:  Negative for cough and chest tightness.   ?Gastrointestinal:  Negative for abdominal pain and nausea.  ?Genitourinary:  Negative for difficulty urinating, frequency and vaginal pain.  ?Musculoskeletal:  Negative for back pain and gait problem.  ?Skin:  Negative  for pallor and rash.  ?Neurological:  Negative for dizziness, tremors, weakness, numbness and headaches.  ?Psychiatric/Behavioral:  Negative for confusion and sleep disturbance.   ? ?Objective:  ?BP 120/78 (BP Location: Left Arm, Patient Position: Sitting, Cuff Size: Normal)   Pulse 67   Temp 98.1 ?F (36.7 ?C) (Oral)   Ht '5\' 2"'$  (1.575 m)   Wt 185 lb (83.9 kg)   SpO2 95%   BMI 33.84 kg/m?  ? ?BP Readings from Last 3 Encounters:  ?01/08/22 120/78  ?10/09/21 136/80  ?08/07/21 120/62  ? ? ?Wt Readings from Last 3 Encounters:  ?01/08/22 185 lb (83.9 kg)  ?10/09/21 184 lb (83.5 kg)  ?08/07/21 186 lb 6.4 oz (84.6 kg)  ? ? ?Physical Exam ?Constitutional:   ?   General: She is not in acute distress. ?   Appearance: She is well-developed. She is obese.  ?HENT:  ?   Head: Normocephalic.  ?   Right Ear: External ear normal.  ?   Left Ear: External ear normal.  ?   Nose: Nose normal.  ?Eyes:  ?   General:     ?   Right eye: No discharge.     ?   Left eye: No discharge.  ?   Conjunctiva/sclera: Conjunctivae normal.  ?   Pupils: Pupils are equal, round, and reactive to light.  ?Neck:  ?   Thyroid: No thyromegaly.  ?   Vascular: No  JVD.  ?   Trachea: No tracheal deviation.  ?Cardiovascular:  ?   Rate and Rhythm: Normal rate and regular rhythm.  ?   Heart sounds: Normal heart sounds.  ?Pulmonary:  ?   Effort: No respiratory distress.  ?   Breath sounds: No stridor. No wheezing.  ?Abdominal:  ?   General: Bowel sounds are normal. There is no distension.  ?   Palpations: Abdomen is soft. There is no mass.  ?   Tenderness: There is no abdominal tenderness. There is no guarding or rebound.  ?Musculoskeletal:     ?   General: No tenderness.  ?   Cervical back: Normal range of motion and neck supple. No rigidity.  ?Lymphadenopathy:  ?   Cervical: No cervical adenopathy.  ?Skin: ?   Findings: No erythema or rash.  ?Neurological:  ?   Cranial Nerves: No cranial nerve deficit.  ?   Motor: No abnormal muscle tone.  ?   Coordination:  Coordination normal.  ?   Deep Tendon Reflexes: Reflexes normal.  ?Psychiatric:     ?   Behavior: Behavior normal.     ?   Thought Content: Thought content normal.     ?   Judgment: Judgment normal.  ? ? ?Lab Results  ?Component Value Date  ? WBC 5.8 02/05/2021  ? HGB 14.8 02/05/2021  ? HCT 43.5 02/05/2021  ? PLT 424 (H) 04/11/2020  ? GLUCOSE 110 (H) 02/05/2021  ? CHOL 230 (H) 02/05/2021  ? TRIG 133 02/05/2021  ? HDL 75 02/05/2021  ? LDLCALC 132 (H) 02/05/2021  ? ALT 30 02/05/2021  ? AST 21 02/05/2021  ? NA 140 02/05/2021  ? K 4.9 02/05/2021  ? CL 104 02/05/2021  ? CREATININE 0.61 02/05/2021  ? BUN 10 02/05/2021  ? CO2 19 (L) 02/05/2021  ? TSH 1.870 02/05/2021  ? HGBA1C 6.3 (H) 02/05/2021  ? ? ?MM DIAG BREAST TOMO BILATERAL ? ?Result Date: 10/23/2020 ?CLINICAL DATA:  Diffuse intermittent lateral right breast pain. EXAM: DIGITAL DIAGNOSTIC BILATERAL MAMMOGRAM WITH TOMO AND CAD TECHNIQUE: Bilateral digital diagnostic mammography and breast tomosynthesis was performed. Digital images of the breasts were evaluated with computer-aided detection. COMPARISON:  Previous exam(s). ACR Breast Density Category b: There are scattered areas of fibroglandular density. FINDINGS: No suspicious masses, calcifications, or distortion are identified in either breast. IMPRESSION: No mammographic evidence of malignancy. RECOMMENDATION: Annual screening mammography. I have discussed the findings and recommendations with the patient. If applicable, a reminder letter will be sent to the patient regarding the next appointment. BI-RADS CATEGORY  1: Negative. Electronically Signed   By: [name] M.D   On: 10/23/2020 10:49  ? ? ?Assessment & Plan:  ? ?Problem List Items Addressed This Visit   ? ? Diabetes mellitus type 2 in obese [facility])  ?  Cont w/metformin ? ? ?  ?  ? Relevant Orders  ? TSH  ? Urinalysis  ? CBC with Differential/Platelet  ? Lipid panel  ? Comprehensive metabolic panel  ? Hemoglobin A1c  ? Essential hypertension  ?   Continue with weight loss ?Continue with amlodipine, Bystolic, as needed clonidine ? ?  ?  ? GERD  ?  Worse.  Restart Protonix ? ?  ?  ? Relevant Medications  ? pantoprazole (PROTONIX) 40 MG tablet  ? Hyperglycemia  ?  We will check A1c ? ?  ?  ? Relevant Orders  ? Hemoglobin A1c  ? Obesity  ?  Continue with a low-carb  diet ? ?  ?  ? ?Other Visit Diagnoses   ? ? Well adult exam    -  Primary  ? Relevant Orders  ? TSH  ? Urinalysis  ? CBC with Differential/Platelet  ? Lipid panel  ? Comprehensive metabolic panel  ? Hemoglobin A1c  ? ?  ?  ? ? ?Meds ordered this encounter  ?Medications  ? pantoprazole (PROTONIX) 40 MG tablet  ?  Sig: Take 1 tablet (40 mg total) by mouth daily. PRN  ?  Dispense:  90 tablet  ?  Refill:  3  ?  ? ? ?Follow-up: Return in about 2 months (around 03/10/2022) for Wellness Exam. ? ?Walker Kehr, MD ?

## 2022-01-08 NOTE — Assessment & Plan Note (Signed)
Cont w/metformin 

## 2022-01-11 NOTE — Assessment & Plan Note (Signed)
Worse Re-start Protonix 

## 2022-01-11 NOTE — Assessment & Plan Note (Signed)
Continue with a low-carb diet ?

## 2022-01-11 NOTE — Assessment & Plan Note (Signed)
We will check A1c ?

## 2022-01-11 NOTE — Assessment & Plan Note (Signed)
Continue with weight loss ?Continue with amlodipine, Bystolic, as needed clonidine ?

## 2022-03-10 ENCOUNTER — Encounter: Payer: Self-pay | Admitting: Internal Medicine

## 2022-03-10 ENCOUNTER — Ambulatory Visit (INDEPENDENT_AMBULATORY_CARE_PROVIDER_SITE_OTHER): Payer: PPO | Admitting: Internal Medicine

## 2022-03-10 DIAGNOSIS — R252 Cramp and spasm: Secondary | ICD-10-CM | POA: Insufficient documentation

## 2022-03-10 DIAGNOSIS — E1169 Type 2 diabetes mellitus with other specified complication: Secondary | ICD-10-CM

## 2022-03-10 DIAGNOSIS — R739 Hyperglycemia, unspecified: Secondary | ICD-10-CM | POA: Diagnosis not present

## 2022-03-10 DIAGNOSIS — Z0000 Encounter for general adult medical examination without abnormal findings: Secondary | ICD-10-CM

## 2022-03-10 DIAGNOSIS — E669 Obesity, unspecified: Secondary | ICD-10-CM

## 2022-03-10 DIAGNOSIS — R55 Syncope and collapse: Secondary | ICD-10-CM | POA: Diagnosis not present

## 2022-03-10 LAB — CBC WITH DIFFERENTIAL/PLATELET
Basophils Absolute: 0 10*3/uL (ref 0.0–0.1)
Basophils Relative: 0.8 % (ref 0.0–3.0)
Eosinophils Absolute: 0.1 10*3/uL (ref 0.0–0.7)
Eosinophils Relative: 1.7 % (ref 0.0–5.0)
HCT: 44.3 % (ref 36.0–46.0)
Hemoglobin: 14.6 g/dL (ref 12.0–15.0)
Lymphocytes Relative: 41.3 % (ref 12.0–46.0)
Lymphs Abs: 2.2 10*3/uL (ref 0.7–4.0)
MCHC: 32.9 g/dL (ref 30.0–36.0)
MCV: 92.4 fl (ref 78.0–100.0)
Monocytes Absolute: 0.4 10*3/uL (ref 0.1–1.0)
Monocytes Relative: 8 % (ref 3.0–12.0)
Neutro Abs: 2.6 10*3/uL (ref 1.4–7.7)
Neutrophils Relative %: 48.2 % (ref 43.0–77.0)
Platelets: 365 10*3/uL (ref 150.0–400.0)
RBC: 4.8 Mil/uL (ref 3.87–5.11)
RDW: 13.2 % (ref 11.5–15.5)
WBC: 5.3 10*3/uL (ref 4.0–10.5)

## 2022-03-10 LAB — COMPREHENSIVE METABOLIC PANEL
ALT: 34 U/L (ref 0–35)
AST: 26 U/L (ref 0–37)
Albumin: 4.2 g/dL (ref 3.5–5.2)
Alkaline Phosphatase: 51 U/L (ref 39–117)
BUN: 11 mg/dL (ref 6–23)
CO2: 27 mEq/L (ref 19–32)
Calcium: 10.5 mg/dL (ref 8.4–10.5)
Chloride: 106 mEq/L (ref 96–112)
Creatinine, Ser: 0.74 mg/dL (ref 0.40–1.20)
GFR: 83.24 mL/min (ref >60.00)
Glucose, Bld: 101 mg/dL — ABNORMAL HIGH (ref 70–99)
Potassium: 5.3 mEq/L — ABNORMAL HIGH (ref 3.5–5.1)
Sodium: 141 mEq/L (ref 135–145)
Total Bilirubin: 0.4 mg/dL (ref 0.2–1.2)
Total Protein: 7.5 g/dL (ref 6.0–8.3)

## 2022-03-10 LAB — URINALYSIS
Bilirubin Urine: NEGATIVE
Hgb urine dipstick: NEGATIVE
Ketones, ur: NEGATIVE
Leukocytes,Ua: NEGATIVE
Nitrite: NEGATIVE
Specific Gravity, Urine: 1.015 (ref 1.000–1.030)
Total Protein, Urine: NEGATIVE
Urine Glucose: NEGATIVE
Urobilinogen, UA: 0.2 (ref 0.0–1.0)
pH: 6.5 (ref 5.0–8.0)

## 2022-03-10 LAB — LIPID PANEL
Cholesterol: 244 mg/dL — ABNORMAL HIGH (ref 0–200)
HDL: 67.9 mg/dL (ref >39.00)
LDL Cholesterol: 148 mg/dL — ABNORMAL HIGH (ref 0–99)
NonHDL: 175.73
Total CHOL/HDL Ratio: 4
Triglycerides: 140 mg/dL (ref 0.0–149.0)
VLDL: 28 mg/dL (ref 0.0–40.0)

## 2022-03-10 LAB — TSH: TSH: 0.99 u[IU]/mL (ref 0.35–5.50)

## 2022-03-10 LAB — HEMOGLOBIN A1C: Hgb A1c MFr Bld: 6.4 % (ref 4.6–6.5)

## 2022-03-10 NOTE — Assessment & Plan Note (Signed)
B leg cramps Check CMET, CBC Try Mag oil spray prn

## 2022-03-10 NOTE — Assessment & Plan Note (Addendum)
6/23 new x 1 (h/o remote vaso-vagal syncope) Probably vaso-vagal Check CBC, CMET, TSH, UA Recent CXR was ok Hold BP meds if needed Cardiology ref, ECHO offered - pt declined

## 2022-03-10 NOTE — Progress Notes (Signed)
Subjective:  Patient ID: Denise Donaldson Current, female    DOB: 1956/01/16  Age: 66 y.o. MRN: [id number]  CC: No chief complaint on file.   HPI [name] presents for HTN, GERD, DM f/u C/o syncope on Thur - not all the way once; weak and lightheaded for a few days. Pt was not feeling well.Marland KitchenMarland KitchenNo CP, no SOB, no n/v/d  Outpatient Medications Prior to Visit  Medication Sig Dispense Refill   amLODipine (NORVASC) 2.5 MG tablet Take 1 tablet (2.5 mg total) by mouth 2 (two) times daily. 180 tablet 3   b complex vitamins tablet Take 1 tablet by mouth daily. 100 tablet 3   Cholecalciferol (VITAMIN D3) 50 MCG (2000 UT) capsule Take 1 capsule (2,000 Units total) by mouth daily. 100 capsule 3   cloNIDine (CATAPRES) 0.1 MG tablet Take 1 tablet (0.1 mg total) by mouth 2 (two) times daily as needed (for blood pressure over 180). 60 tablet 1   metFORMIN (GLUCOPHAGE) 500 MG tablet Take 1 tablet by mouth once daily with breakfast 90 tablet 3   nebivolol (BYSTOLIC) 5 MG tablet Take 1 tablet (5 mg total) by mouth in the morning and at bedtime. 180 tablet 3   pantoprazole (PROTONIX) 40 MG tablet Take 1 tablet (40 mg total) by mouth daily. PRN 90 tablet 3   HYDROcodone bit-homatropine (HYCODAN) 5-1.5 MG/5ML syrup Take 5 mLs by mouth every 8 (eight) hours as needed for cough. 240 mL 0   No facility-administered medications prior to visit.    ROS: Review of Systems  Constitutional:  Negative for activity change, appetite change, chills, fatigue and unexpected weight change.  HENT:  Negative for congestion, mouth sores and sinus pressure.   Eyes:  Negative for visual disturbance.  Respiratory:  Negative for cough and chest tightness.   Gastrointestinal:  Negative for abdominal pain and nausea.  Genitourinary:  Negative for difficulty urinating, frequency and vaginal pain.  Musculoskeletal:  Negative for back pain and gait problem.  Skin:  Negative for pallor and rash.  Neurological:  Positive for  syncope and light-headedness. Negative for dizziness, tremors, weakness, numbness and headaches.  Psychiatric/Behavioral:  Negative for confusion and sleep disturbance.     Objective:  BP 112/78 (BP Location: Left Arm, Patient Position: Sitting, Cuff Size: Normal)   Pulse (!) 58   Temp 97.9 F (36.6 C) (Oral)   Ht '5\' 2"'$  (1.575 m)   Wt 183 lb (83 kg)   SpO2 95%   BMI 33.47 kg/m   BP Readings from Last 3 Encounters:  03/10/22 112/78  01/08/22 120/78  10/09/21 136/80    Wt Readings from Last 3 Encounters:  03/10/22 183 lb (83 kg)  01/08/22 185 lb (83.9 kg)  10/09/21 184 lb (83.5 kg)    Physical Exam Constitutional:      General: She is not in acute distress.    Appearance: She is well-developed. She is obese.  HENT:     Head: Normocephalic.     Right Ear: External ear normal.     Left Ear: External ear normal.     Nose: Nose normal.  Eyes:     General:        Right eye: No discharge.        Left eye: No discharge.     Conjunctiva/sclera: Conjunctivae normal.     Pupils: Pupils are equal, round, and reactive to light.  Neck:     Thyroid: No thyromegaly.     Vascular: No JVD.  Trachea: No tracheal deviation.  Cardiovascular:     Rate and Rhythm: Regular rhythm. Bradycardia present.     Heart sounds: Normal heart sounds.  Pulmonary:     Effort: No respiratory distress.     Breath sounds: No stridor. No wheezing.  Abdominal:     General: Bowel sounds are normal. There is no distension.     Palpations: Abdomen is soft. There is no mass.     Tenderness: There is no abdominal tenderness. There is no guarding or rebound.  Musculoskeletal:        General: No tenderness.     Cervical back: Normal range of motion and neck supple. No rigidity.     Right lower leg: No edema.     Left lower leg: No edema.  Lymphadenopathy:     Cervical: No cervical adenopathy.  Skin:    Findings: No erythema or rash.  Neurological:     Mental Status: She is oriented to person,  place, and time.     Cranial Nerves: No cranial nerve deficit.     Motor: No abnormal muscle tone.     Coordination: Coordination normal.     Deep Tendon Reflexes: Reflexes normal.  Psychiatric:        Behavior: Behavior normal.        Thought Content: Thought content normal.        Judgment: Judgment normal.   Procedure: EKG Indication: syncope Impression: S brady. No acute changes.   Lab Results  Component Value Date   WBC 5.8 02/05/2021   HGB 14.8 02/05/2021   HCT 43.5 02/05/2021   PLT 424 (H) 04/11/2020   GLUCOSE 110 (H) 02/05/2021   CHOL 230 (H) 02/05/2021   TRIG 133 02/05/2021   HDL 75 02/05/2021   LDLCALC 132 (H) 02/05/2021   ALT 30 02/05/2021   AST 21 02/05/2021   NA 140 02/05/2021   K 4.9 02/05/2021   CL 104 02/05/2021   CREATININE 0.61 02/05/2021   BUN 10 02/05/2021   CO2 19 (L) 02/05/2021   TSH 1.870 02/05/2021   HGBA1C 6.3 (H) 02/05/2021    MM DIAG BREAST TOMO BILATERAL  Result Date: 10/23/2020 CLINICAL DATA:  Diffuse intermittent lateral right breast pain. EXAM: DIGITAL DIAGNOSTIC BILATERAL MAMMOGRAM WITH TOMO AND CAD TECHNIQUE: Bilateral digital diagnostic mammography and breast tomosynthesis was performed. Digital images of the breasts were evaluated with computer-aided detection. COMPARISON:  Previous exam(s). ACR Breast Density Category b: There are scattered areas of fibroglandular density. FINDINGS: No suspicious masses, calcifications, or distortion are identified in either breast. IMPRESSION: No mammographic evidence of malignancy. RECOMMENDATION: Annual screening mammography. I have discussed the findings and recommendations with the patient. If applicable, a reminder letter will be sent to the patient regarding the next appointment. BI-RADS CATEGORY  1: Negative. Electronically Signed   By: [name] M.D   On: 10/23/2020 10:49    Assessment & Plan:   Problem List Items Addressed This Visit     Cramps, extremity    B leg cramps Check  CMET, CBC Try Mag oil spray prn      Syncope    6/23 new x 1 (h/o remote vaso-vagal syncope) Probably vaso-vagal Check CBC, CMET, TSH, UA Recent CXR was ok Hold BP meds if needed Cardiology ref, ECHO offered - pt declined      Relevant Orders   EKG 12-Lead      No orders of the defined types were placed in this encounter.  Follow-up: Return in about 2 weeks (around 03/24/2022) for a follow-up visit.  Walker Kehr, MD

## 2022-03-26 ENCOUNTER — Ambulatory Visit (INDEPENDENT_AMBULATORY_CARE_PROVIDER_SITE_OTHER): Payer: PPO | Admitting: Internal Medicine

## 2022-03-26 ENCOUNTER — Encounter: Payer: Self-pay | Admitting: Internal Medicine

## 2022-03-26 DIAGNOSIS — E669 Obesity, unspecified: Secondary | ICD-10-CM | POA: Diagnosis not present

## 2022-03-26 DIAGNOSIS — E559 Vitamin D deficiency, unspecified: Secondary | ICD-10-CM | POA: Diagnosis not present

## 2022-03-26 DIAGNOSIS — E1169 Type 2 diabetes mellitus with other specified complication: Secondary | ICD-10-CM | POA: Diagnosis not present

## 2022-03-26 DIAGNOSIS — R55 Syncope and collapse: Secondary | ICD-10-CM

## 2022-03-26 DIAGNOSIS — I10 Essential (primary) hypertension: Secondary | ICD-10-CM

## 2022-03-26 NOTE — Assessment & Plan Note (Addendum)
Controlled w/diet. BP meds are on hold due to syncope x1 Continue with weight loss  Cardiology ref, ECHO offered - pt declined Re-start amlodipine, Bystolic, as needed clonidine if high BP

## 2022-03-26 NOTE — Assessment & Plan Note (Signed)
On Vit D 

## 2022-03-26 NOTE — Assessment & Plan Note (Signed)
Recent syncope - no relapse. Off BP meds now. Drink water. Cardiology ref, ECHO offered - pt declined

## 2022-03-26 NOTE — Progress Notes (Signed)
Subjective:  Patient ID: Darci Current, female    DOB: December 25, 1955  Age: 66 y.o. MRN: [id number]  CC: No chief complaint on file.   HPI Brittanya Killman presents for HTN, DM, obesity F/u on syncope - no relapse. Off BP meds now  Outpatient Medications Prior to Visit  Medication Sig Dispense Refill   amLODipine (NORVASC) 2.5 MG tablet Take 1 tablet (2.5 mg total) by mouth 2 (two) times daily. 180 tablet 3   b complex vitamins tablet Take 1 tablet by mouth daily. 100 tablet 3   Cholecalciferol (VITAMIN D3) 50 MCG (2000 UT) capsule Take 1 capsule (2,000 Units total) by mouth daily. 100 capsule 3   cloNIDine (CATAPRES) 0.1 MG tablet Take 1 tablet (0.1 mg total) by mouth 2 (two) times daily as needed (for blood pressure over 180). 60 tablet 1   metFORMIN (GLUCOPHAGE) 500 MG tablet Take 1 tablet by mouth once daily with breakfast 90 tablet 3   nebivolol (BYSTOLIC) 5 MG tablet Take 1 tablet (5 mg total) by mouth in the morning and at bedtime. 180 tablet 3   pantoprazole (PROTONIX) 40 MG tablet Take 1 tablet (40 mg total) by mouth daily. PRN 90 tablet 3   No facility-administered medications prior to visit.    ROS: Review of Systems  Constitutional:  Negative for activity change, appetite change, chills, fatigue and unexpected weight change.  HENT:  Negative for congestion, mouth sores and sinus pressure.   Eyes:  Negative for visual disturbance.  Respiratory:  Negative for cough and chest tightness.   Gastrointestinal:  Negative for abdominal pain and nausea.  Genitourinary:  Negative for difficulty urinating, frequency and vaginal pain.  Musculoskeletal:  Negative for back pain and gait problem.  Skin:  Negative for pallor and rash.  Neurological:  Negative for dizziness, tremors, weakness, numbness and headaches.  Psychiatric/Behavioral:  Negative for confusion and sleep disturbance. The patient is not nervous/anxious.     Objective:  BP 130/80 (BP Location: Left Arm,  Patient Position: Sitting, Cuff Size: Normal)   Pulse 69   Temp 98 F (36.7 C) (Oral)   Ht '5\' 2"'$  (1.575 m)   SpO2 97%   BMI 33.47 kg/m   BP Readings from Last 3 Encounters:  03/26/22 130/80  03/10/22 112/78  01/08/22 120/78    Wt Readings from Last 3 Encounters:  03/10/22 183 lb (83 kg)  01/08/22 185 lb (83.9 kg)  10/09/21 184 lb (83.5 kg)    Physical Exam Constitutional:      General: She is not in acute distress.    Appearance: She is well-developed. She is obese.  HENT:     Head: Normocephalic.     Right Ear: External ear normal.     Left Ear: External ear normal.     Nose: Nose normal.  Eyes:     General:        Right eye: No discharge.        Left eye: No discharge.     Conjunctiva/sclera: Conjunctivae normal.     Pupils: Pupils are equal, round, and reactive to light.  Neck:     Thyroid: No thyromegaly.     Vascular: No JVD.     Trachea: No tracheal deviation.  Cardiovascular:     Rate and Rhythm: Normal rate and regular rhythm.     Heart sounds: Normal heart sounds.  Pulmonary:     Effort: No respiratory distress.     Breath sounds: No stridor. No wheezing.  Abdominal:  General: Bowel sounds are normal. There is no distension.     Palpations: Abdomen is soft. There is no mass.     Tenderness: There is no abdominal tenderness. There is no guarding or rebound.  Musculoskeletal:        General: No tenderness.     Cervical back: Normal range of motion and neck supple. No rigidity.  Lymphadenopathy:     Cervical: No cervical adenopathy.  Skin:    Findings: No erythema or rash.  Neurological:     Mental Status: She is oriented to person, place, and time.     Cranial Nerves: No cranial nerve deficit.     Motor: No abnormal muscle tone.     Coordination: Coordination normal.     Deep Tendon Reflexes: Reflexes normal.  Psychiatric:        Behavior: Behavior normal.        Thought Content: Thought content normal.        Judgment: Judgment normal.     Lab Results  Component Value Date   WBC 5.3 03/10/2022   HGB 14.6 03/10/2022   HCT 44.3 03/10/2022   PLT 365.0 03/10/2022   GLUCOSE 101 (H) 03/10/2022   CHOL 244 (H) 03/10/2022   TRIG 140.0 03/10/2022   HDL 67.90 03/10/2022   LDLCALC 148 (H) 03/10/2022   ALT 34 03/10/2022   AST 26 03/10/2022   NA 141 03/10/2022   K 5.3 (H) 03/10/2022   CL 106 03/10/2022   CREATININE 0.74 03/10/2022   BUN 11 03/10/2022   CO2 27 03/10/2022   TSH 0.99 03/10/2022   HGBA1C 6.4 03/10/2022    MM DIAG BREAST TOMO BILATERAL  Result Date: 10/23/2020 CLINICAL DATA:  Diffuse intermittent lateral right breast pain. EXAM: DIGITAL DIAGNOSTIC BILATERAL MAMMOGRAM WITH TOMO AND CAD TECHNIQUE: Bilateral digital diagnostic mammography and breast tomosynthesis was performed. Digital images of the breasts were evaluated with computer-aided detection. COMPARISON:  Previous exam(s). ACR Breast Density Category b: There are scattered areas of fibroglandular density. FINDINGS: No suspicious masses, calcifications, or distortion are identified in either breast. IMPRESSION: No mammographic evidence of malignancy. RECOMMENDATION: Annual screening mammography. I have discussed the findings and recommendations with the patient. If applicable, a reminder letter will be sent to the patient regarding the next appointment. BI-RADS CATEGORY  1: Negative. Electronically Signed   By: [name] M.D   On: 10/23/2020 10:49    Assessment & Plan:   Problem List Items Addressed This Visit     Diabetes mellitus type 2 in obese ([place])    Check A1c Cont w/metformin       Essential hypertension    Better controlled Continue with weight loss Continue with amlodipine, Bystolic, as needed clonidine      Vitamin D deficiency    On Vit D         No orders of the defined types were placed in this encounter.     Follow-up: Return in about 3 months (around 06/26/2022) for a follow-up visit.  Walker Kehr, MD

## 2022-03-26 NOTE — Assessment & Plan Note (Signed)
Check A1c Cont w/metformin

## 2022-04-29 ENCOUNTER — Encounter (INDEPENDENT_AMBULATORY_CARE_PROVIDER_SITE_OTHER): Payer: Self-pay

## 2022-06-05 DIAGNOSIS — H5213 Myopia, bilateral: Secondary | ICD-10-CM | POA: Diagnosis not present

## 2022-06-05 DIAGNOSIS — H2513 Age-related nuclear cataract, bilateral: Secondary | ICD-10-CM | POA: Diagnosis not present

## 2022-06-25 ENCOUNTER — Ambulatory Visit: Payer: PPO | Admitting: Internal Medicine

## 2022-07-06 ENCOUNTER — Ambulatory Visit: Payer: PPO

## 2022-07-06 ENCOUNTER — Ambulatory Visit: Payer: PPO | Admitting: Internal Medicine

## 2022-07-09 ENCOUNTER — Encounter: Payer: Self-pay | Admitting: Gastroenterology

## 2022-07-15 ENCOUNTER — Ambulatory Visit (INDEPENDENT_AMBULATORY_CARE_PROVIDER_SITE_OTHER): Payer: PPO | Admitting: Internal Medicine

## 2022-07-15 ENCOUNTER — Encounter: Payer: Self-pay | Admitting: Internal Medicine

## 2022-07-15 ENCOUNTER — Ambulatory Visit (INDEPENDENT_AMBULATORY_CARE_PROVIDER_SITE_OTHER): Payer: PPO

## 2022-07-15 VITALS — BP 112/70 | HR 70 | Temp 98.0°F | Ht 62.0 in | Wt 189.6 lb

## 2022-07-15 DIAGNOSIS — I10 Essential (primary) hypertension: Secondary | ICD-10-CM | POA: Diagnosis not present

## 2022-07-15 DIAGNOSIS — Z6835 Body mass index (BMI) 35.0-35.9, adult: Secondary | ICD-10-CM

## 2022-07-15 DIAGNOSIS — R519 Headache, unspecified: Secondary | ICD-10-CM

## 2022-07-15 DIAGNOSIS — E669 Obesity, unspecified: Secondary | ICD-10-CM

## 2022-07-15 DIAGNOSIS — R609 Edema, unspecified: Secondary | ICD-10-CM

## 2022-07-15 DIAGNOSIS — Z0000 Encounter for general adult medical examination without abnormal findings: Secondary | ICD-10-CM | POA: Diagnosis not present

## 2022-07-15 DIAGNOSIS — E1169 Type 2 diabetes mellitus with other specified complication: Secondary | ICD-10-CM | POA: Diagnosis not present

## 2022-07-15 MED ORDER — NAPROXEN 500 MG PO TABS: 500.0000 mg | ORAL_TABLET | Freq: Two times a day (BID) | ORAL | 2 refills | Status: DC | PRN

## 2022-07-15 NOTE — Patient Instructions (Signed)
Migraines due to weather changes Advil or Aleve prn

## 2022-07-15 NOTE — Patient Instructions (Signed)
Denise Donaldson , Thank you for taking time to come for your Medicare Wellness Visit. I appreciate your ongoing commitment to your health goals. Please review the following plan we discussed and let me know if I can assist you in the future.   These are the goals we discussed:  Goals   None     This is a list of the screening recommended for you and due dates:  Health Maintenance  Topic Date Due   Complete foot exam   Never done   Eye exam for diabetics  Never done   Yearly kidney health urinalysis for diabetes  Never done   Hepatitis C Screening: USPSTF Recommendation to screen - Ages 57-79 yo.  Never done   Pneumonia Vaccine (1 - PCV) Never done   DEXA scan (bone density measurement)  Never done   COVID-19 Vaccine (4 - Pfizer risk series) 09/29/2020   Colon Cancer Screening  04/27/2022   Hemoglobin A1C  09/09/2022   Mammogram  10/23/2022   Yearly kidney function blood test for diabetes  03/11/2023   Medicare Annual Wellness Visit  08/15/2023   Tetanus Vaccine  06/01/2026   HPV Vaccine  Aged Out   Flu Shot  Discontinued   Zoster (Shingles) Vaccine  Discontinued    Advanced directives: No  Conditions/risks identified: Yes  Next appointment: Follow up in one year for your annual wellness visit.   Preventive Care 33 Years and Older, Female Preventive care refers to lifestyle choices and visits with your health care provider that can promote health and wellness. What does preventive care include? A yearly physical exam. This is also called an annual well check. Dental exams once or twice a year. Routine eye exams. Ask your health care provider how often you should have your eyes checked. Personal lifestyle choices, including: Daily care of your teeth and gums. Regular physical activity. Eating a healthy diet. Avoiding tobacco and drug use. Limiting alcohol use. Practicing safe sex. Taking low-dose aspirin every day. Taking vitamin and mineral supplements as recommended  by your health care provider. What happens during an annual well check? The services and screenings done by your health care provider during your annual well check will depend on your age, overall health, lifestyle risk factors, and family history of disease. Counseling  Your health care provider may ask you questions about your: Alcohol use. Tobacco use. Drug use. Emotional well-being. Home and relationship well-being. Sexual activity. Eating habits. History of falls. Memory and ability to understand (cognition). Work and work Statistician. Reproductive health. Screening  You may have the following tests or measurements: Height, weight, and BMI. Blood pressure. Lipid and cholesterol levels. These may be checked every 5 years, or more frequently if you are over 33 years old. Skin check. Lung cancer screening. You may have this screening every year starting at age 22 if you have a 30-pack-year history of smoking and currently smoke or have quit within the past 15 years. Fecal occult blood test (FOBT) of the stool. You may have this test every year starting at age 90. Flexible sigmoidoscopy or colonoscopy. You may have a sigmoidoscopy every 5 years or a colonoscopy every 10 years starting at age 61. Hepatitis C blood test. Hepatitis B blood test. Sexually transmitted disease (STD) testing. Diabetes screening. This is done by checking your blood sugar (glucose) after you have not eaten for a while (fasting). You may have this done every 1-3 years. Bone density scan. This is done to screen for osteoporosis. You  may have this done starting at age 67. Mammogram. This may be done every 1-2 years. Talk to your health care provider about how often you should have regular mammograms. Talk with your health care provider about your test results, treatment options, and if necessary, the need for more tests. Vaccines  Your health care provider may recommend certain vaccines, such as: Influenza  vaccine. This is recommended every year. Tetanus, diphtheria, and acellular pertussis (Tdap, Td) vaccine. You may need a Td booster every 10 years. Zoster vaccine. You may need this after age 28. Pneumococcal 13-valent conjugate (PCV13) vaccine. One dose is recommended after age 86. Pneumococcal polysaccharide (PPSV23) vaccine. One dose is recommended after age 60. Talk to your health care provider about which screenings and vaccines you need and how often you need them. This information is not intended to replace advice given to you by your health care provider. Make sure you discuss any questions you have with your health care provider. Document Released: 10/04/2015 Document Revised: 05/27/2016 Document Reviewed: 07/09/2015 Elsevier Interactive Patient Education  2017 [place] Prevention in the Home Falls can cause injuries. They can happen to people of all ages. There are many things you can do to make your home safe and to help prevent falls. What can I do on the outside of my home? Regularly fix the edges of walkways and driveways and fix any cracks. Remove anything that might make you trip as you walk through a door, such as a raised step or threshold. Trim any bushes or trees on the path to your home. Use bright outdoor lighting. Clear any walking paths of anything that might make someone trip, such as rocks or tools. Regularly check to see if handrails are loose or broken. Make sure that both sides of any steps have handrails. Any raised decks and porches should have guardrails on the edges. Have any leaves, snow, or ice cleared regularly. Use sand or salt on walking paths during winter. Clean up any spills in your garage right away. This includes oil or grease spills. What can I do in the bathroom? Use night lights. Install grab bars by the toilet and in the tub and shower. Do not use towel bars as grab bars. Use non-skid mats or decals in the tub or shower. If you  need to sit down in the shower, use a plastic, non-slip stool. Keep the floor dry. Clean up any water that spills on the floor as soon as it happens. Remove soap buildup in the tub or shower regularly. Attach bath mats securely with double-sided non-slip rug tape. Do not have throw rugs and other things on the floor that can make you trip. What can I do in the bedroom? Use night lights. Make sure that you have a light by your bed that is easy to reach. Do not use any sheets or blankets that are too big for your bed. They should not hang down onto the floor. Have a firm chair that has side arms. You can use this for support while you get dressed. Do not have throw rugs and other things on the floor that can make you trip. What can I do in the kitchen? Clean up any spills right away. Avoid walking on wet floors. Keep items that you use a lot in easy-to-reach places. If you need to reach something above you, use a strong step stool that has a grab bar. Keep electrical cords out of the way. Do not use floor  polish or wax that makes floors slippery. If you must use wax, use non-skid floor wax. Do not have throw rugs and other things on the floor that can make you trip. What can I do with my stairs? Do not leave any items on the stairs. Make sure that there are handrails on both sides of the stairs and use them. Fix handrails that are broken or loose. Make sure that handrails are as long as the stairways. Check any carpeting to make sure that it is firmly attached to the stairs. Fix any carpet that is loose or worn. Avoid having throw rugs at the top or bottom of the stairs. If you do have throw rugs, attach them to the floor with carpet tape. Make sure that you have a light switch at the top of the stairs and the bottom of the stairs. If you do not have them, ask someone to add them for you. What else can I do to help prevent falls? Wear shoes that: Do not have high heels. Have rubber  bottoms. Are comfortable and fit you well. Are closed at the toe. Do not wear sandals. If you use a stepladder: Make sure that it is fully opened. Do not climb a closed stepladder. Make sure that both sides of the stepladder are locked into place. Ask someone to hold it for you, if possible. Clearly mark and make sure that you can see: Any grab bars or handrails. First and last steps. Where the edge of each step is. Use tools that help you move around (mobility aids) if they are needed. These include: Canes. Walkers. Scooters. Crutches. Turn on the lights when you go into a dark area. Replace any light bulbs as soon as they burn out. Set up your furniture so you have a clear path. Avoid moving your furniture around. If any of your floors are uneven, fix them. If there are any pets around you, be aware of where they are. Review your medicines with your doctor. Some medicines can make you feel dizzy. This can increase your chance of falling. Ask your doctor what other things that you can do to help prevent falls. This information is not intended to replace advice given to you by your health care provider. Make sure you discuss any questions you have with your health care provider. Document Released: 07/04/2009 Document Revised: 02/13/2016 Document Reviewed: 10/12/2014 Elsevier Interactive Patient Education  2017 [employer].

## 2022-07-15 NOTE — Assessment & Plan Note (Addendum)
Migraines due to weather changes ?neuralgia Naproxen bid x 1-2 weeks

## 2022-07-15 NOTE — Assessment & Plan Note (Signed)
Periodic

## 2022-07-15 NOTE — Assessment & Plan Note (Signed)
Hold BP meds if needed Cardiology ref, ECHO offered - pt declined

## 2022-07-15 NOTE — Progress Notes (Signed)
Subjective:  Patient ID: Denise Donaldson, female    DOB: 03-Jun-1956  Age: 66 y.o. MRN: [id number]  CC: Follow-up (3 month f/u)   HPI Dellamae Sutherland presents for occasional sleepiness - may sleep x 16 h once per 1-2 weeks x many years. C/o labile BP, HA 5/week  Outpatient Medications Prior to Visit  Medication Sig Dispense Refill   amLODipine (NORVASC) 2.5 MG tablet Take 1 tablet (2.5 mg total) by mouth 2 (two) times daily. 180 tablet 3   b complex vitamins tablet Take 1 tablet by mouth daily. 100 tablet 3   Cholecalciferol (VITAMIN D3) 50 MCG (2000 UT) capsule Take 1 capsule (2,000 Units total) by mouth daily. 100 capsule 3   cloNIDine (CATAPRES) 0.1 MG tablet Take 1 tablet (0.1 mg total) by mouth 2 (two) times daily as needed (for blood pressure over 180). 60 tablet 1   metFORMIN (GLUCOPHAGE) 500 MG tablet Take 1 tablet by mouth once daily with breakfast 90 tablet 3   nebivolol (BYSTOLIC) 5 MG tablet Take 1 tablet (5 mg total) by mouth in the morning and at bedtime. 180 tablet 3   pantoprazole (PROTONIX) 40 MG tablet Take 1 tablet (40 mg total) by mouth daily. PRN 90 tablet 3   No facility-administered medications prior to visit.    ROS: Review of Systems  Constitutional:  Positive for fatigue. Negative for activity change, appetite change, chills and unexpected weight change.  HENT:  Negative for congestion, mouth sores and sinus pressure.   Eyes:  Negative for visual disturbance.  Respiratory:  Negative for cough and chest tightness.   Gastrointestinal:  Negative for abdominal pain and nausea.  Genitourinary:  Negative for difficulty urinating, frequency and vaginal pain.  Musculoskeletal:  Negative for back pain and gait problem.  Skin:  Negative for pallor and rash.  Neurological:  Negative for dizziness, tremors, weakness, numbness and headaches.  Psychiatric/Behavioral:  Positive for sleep disturbance. Negative for confusion.     Objective:  BP 112/70 (BP  Location: Left Arm)   Pulse 70   Temp 98 F (36.7 C) (Oral)   Ht '5\' 2"'$  (1.575 m)   Wt 182 lb 9.6 oz (82.8 kg)   SpO2 95%   BMI 33.40 kg/m   BP Readings from Last 3 Encounters:  07/15/22 112/70  07/15/22 112/70  03/26/22 130/80    Wt Readings from Last 3 Encounters:  07/15/22 189 lb 9.6 oz (86 kg)  07/15/22 182 lb 9.6 oz (82.8 kg)  03/10/22 183 lb (83 kg)    Physical Exam Constitutional:      General: She is not in acute distress.    Appearance: She is well-developed. She is obese.  HENT:     Head: Normocephalic.     Right Ear: External ear normal.     Left Ear: External ear normal.     Nose: Nose normal.  Eyes:     General:        Right eye: No discharge.        Left eye: No discharge.     Conjunctiva/sclera: Conjunctivae normal.     Pupils: Pupils are equal, round, and reactive to light.  Neck:     Thyroid: No thyromegaly.     Vascular: No JVD.     Trachea: No tracheal deviation.  Cardiovascular:     Rate and Rhythm: Normal rate and regular rhythm.     Heart sounds: Normal heart sounds.  Pulmonary:     Effort: No respiratory distress.  Breath sounds: No stridor. No wheezing.  Abdominal:     General: Bowel sounds are normal. There is no distension.     Palpations: Abdomen is soft. There is no mass.     Tenderness: There is no abdominal tenderness. There is no guarding or rebound.  Musculoskeletal:        General: No tenderness.     Cervical back: Normal range of motion and neck supple. No rigidity.  Lymphadenopathy:     Cervical: No cervical adenopathy.  Skin:    Findings: No erythema or rash.  Neurological:     Cranial Nerves: No cranial nerve deficit.     Motor: No abnormal muscle tone.     Coordination: Coordination normal.     Deep Tendon Reflexes: Reflexes normal.  Psychiatric:        Behavior: Behavior normal.        Thought Content: Thought content normal.        Judgment: Judgment normal.     Lab Results  Component Value Date   WBC  5.3 03/10/2022   HGB 14.6 03/10/2022   HCT 44.3 03/10/2022   PLT 365.0 03/10/2022   GLUCOSE 101 (H) 03/10/2022   CHOL 244 (H) 03/10/2022   TRIG 140.0 03/10/2022   HDL 67.90 03/10/2022   LDLCALC 148 (H) 03/10/2022   ALT 34 03/10/2022   AST 26 03/10/2022   NA 141 03/10/2022   K 5.3 (H) 03/10/2022   CL 106 03/10/2022   CREATININE 0.74 03/10/2022   BUN 11 03/10/2022   CO2 27 03/10/2022   TSH 0.99 03/10/2022   HGBA1C 6.4 03/10/2022    MM DIAG BREAST TOMO BILATERAL  Result Date: 10/23/2020 CLINICAL DATA:  Diffuse intermittent lateral right breast pain. EXAM: DIGITAL DIAGNOSTIC BILATERAL MAMMOGRAM WITH TOMO AND CAD TECHNIQUE: Bilateral digital diagnostic mammography and breast tomosynthesis was performed. Digital images of the breasts were evaluated with computer-aided detection. COMPARISON:  Previous exam(s). ACR Breast Density Category b: There are scattered areas of fibroglandular density. FINDINGS: No suspicious masses, calcifications, or distortion are identified in either breast. IMPRESSION: No mammographic evidence of malignancy. RECOMMENDATION: Annual screening mammography. I have discussed the findings and recommendations with the patient. If applicable, a reminder letter will be sent to the patient regarding the next appointment. BI-RADS CATEGORY  1: Negative. Electronically Signed   By: [name] M.D   On: 10/23/2020 10:49    Assessment & Plan:   Problem List Items Addressed This Visit     Diabetes mellitus type 2 in obese ([place])    Cont w/metformin (taking 3/week) - Added Rybelsus 11/22 - stopped Cont w/metformin       Edema    Periodic      Essential hypertension    Cardiology ref, ECHO offered - pt declined Re-start amlodipine, Bystolic, as needed clonidine if high BP      Headache    Migraines due to weather changes ?neuralgia Naproxen bid x 1-2 weeks      Relevant Medications   naproxen (NAPROSYN) 500 MG tablet   Obesity    Wt Readings from Last  3 Encounters:  07/15/22 182 lb 9.6 oz (82.8 kg)  03/10/22 183 lb (83 kg)  01/08/22 185 lb (83.9 kg)           Meds ordered this encounter  Medications   naproxen (NAPROSYN) 500 MG tablet    Sig: Take 1 tablet (500 mg total) by mouth 2 (two) times daily as needed for moderate pain.  Dispense:  60 tablet    Refill:  2      Follow-up: Return in about 2 months (around 09/14/2022) for a follow-up visit.  Walker Kehr, MD

## 2022-07-15 NOTE — Assessment & Plan Note (Signed)
Cont w/metformin (taking 3/week) - Added Rybelsus 11/22 - stopped Cont w/metformin

## 2022-07-15 NOTE — Assessment & Plan Note (Signed)
Cardiology ref, ECHO offered - pt declined Re-start amlodipine, Bystolic, as needed clonidine if high BP

## 2022-07-15 NOTE — Progress Notes (Cosign Needed Addendum)
Subjective:   Denise Donaldson is a 66 y.o. female who presents for Medicare Annual (Subsequent) preventive examination.  Review of Systems     Cardiac Risk Factors include: advanced age (>81mn, >>29women);family history of premature cardiovascular disease;hypertension;sedentary lifestyle     Objective:    Today's Vitals   07/15/22 1404  BP: 112/70  Pulse: 70  Temp: 98 F (36.7 C)  TempSrc: Oral  SpO2: 95%  Weight: 189 lb 9.6 oz (86 kg)  Height: '5\' 2"'$  (1.575 m)  PainSc: 0-No pain   Body mass index is 34.68 kg/m.     06/25/2021    1:16 PM 06/24/2020    9:53 AM 03/06/2019    2:46 PM 04/27/2012    8:11 AM  Advanced Directives  Does Patient Have a Medical Advance Directive? No No No Patient does not have advance directive  Would patient like information on creating a medical advance directive? No - Patient declined No - Patient declined No - Patient declined     Current Medications (verified) Outpatient Encounter Medications as of 07/15/2022  Medication Sig   amLODipine (NORVASC) 2.5 MG tablet Take 1 tablet (2.5 mg total) by mouth 2 (two) times daily.   b complex vitamins tablet Take 1 tablet by mouth daily.   Cholecalciferol (VITAMIN D3) 50 MCG (2000 UT) capsule Take 1 capsule (2,000 Units total) by mouth daily.   cloNIDine (CATAPRES) 0.1 MG tablet Take 1 tablet (0.1 mg total) by mouth 2 (two) times daily as needed (for blood pressure over 180).   metFORMIN (GLUCOPHAGE) 500 MG tablet Take 1 tablet by mouth once daily with breakfast   nebivolol (BYSTOLIC) 5 MG tablet Take 1 tablet (5 mg total) by mouth in the morning and at bedtime.   pantoprazole (PROTONIX) 40 MG tablet Take 1 tablet (40 mg total) by mouth daily. PRN   No facility-administered encounter medications on file as of 07/15/2022.    Allergies (verified) Penicillins, Losartan, Metoprolol tartrate, Coreg [carvedilol], Lisinopril, Penicillin g, and Semaglutide(0.25 or 0.'5mg'$ -dos)   History: Past Medical  History:  Diagnosis Date   Chest pain    Costochondral chest pain 2005   Left   Diverticulosis    Foot fracture, right    Dr NLouanne Skye  Foot swelling    GERD (gastroesophageal reflux disease)    Dr BOlevia Perches EGD 2011 WNL   Head ache    Helicobacter pylori gastritis    History of pneumonia    HTN (hypertension)    Hyperplastic colon polyp    Menopausal disorder    After 52 Dr LCorinna Capra  Mini stroke    Numbness of lower limb    Numbness of upper limb    Obesity    Other fatigue    Pectus excavatum    Pre-diabetes    Spine pain    Venous insufficiency    Bilat LE   Vitamin D deficiency    Past Surgical History:  Procedure Laterality Date   APPENDECTOMY     BREAST EXCISIONAL BIOPSY     left breast excisional biopsy 1980   CARDIAC CATHETERIZATION  2006   had double vision past cath   CESAREAN SECTION     1 time   COLONOSCOPY  04/27/2012   Procedure: COLONOSCOPY;  Surgeon: DLafayette Dragon MD;  Location: [facility];  Service: Endoscopy;  Laterality: N/A;   double vision  2006   past cardiac cath/ lasted 9 months and went away   ESOPHAGOGASTRODUODENOSCOPY  04/27/2012   Procedure:  ESOPHAGOGASTRODUODENOSCOPY (EGD);  Surgeon: Lafayette Dragon, MD;  Location: [facility];  Service: Endoscopy;  Laterality: N/A;   OVARIAN CYST REMOVAL     Family History  Problem Relation Age of Onset   Prostate cancer Father    Heart disease Father    Hypertension Father    Heart failure Father    Hypertension Sister    Prostate cancer Paternal Grandfather 68   Colon cancer Paternal Uncle    Stomach cancer Paternal Uncle 53   Diabetes Mother    Diabetes Maternal Aunt    Heart disease Maternal Grandmother    Stroke Paternal Grandmother    Esophageal cancer Neg Hx    Liver cancer Neg Hx    Rectal cancer Neg Hx    Social History   Socioeconomic History   Marital status: Married    Spouse name: Not on file   Number of children: 3   Years of education: Not on file   Highest education level: Not  on file  Occupational History   Occupation: retired  Tobacco Use   Smoking status: Never   Smokeless tobacco: Never  Vaping Use   Vaping Use: Never used  Substance and Sexual Activity   Alcohol use: No   Drug use: No   Sexual activity: Yes    Birth control/protection: Post-menopausal  Other Topics Concern   Not on file  Social History Narrative   Daily Caffeine - 2/day   Regular Exercise -  NO   Social Determinants of Health   Financial Resource Strain: Low Risk  (07/15/2022)   Overall Financial Resource Strain (CARDIA)    Difficulty of Paying Living Expenses: Not hard at all  Food Insecurity: No Food Insecurity (07/15/2022)   Hunger Vital Sign    Worried About Running Out of Food in the Last Year: Never true    Ran Out of Food in the Last Year: Never true  Transportation Needs: No Transportation Needs (07/15/2022)   PRAPARE - Hydrologist (Medical): No    Lack of Transportation (Non-Medical): No  Physical Activity: Inactive (07/15/2022)   Exercise Vital Sign    Days of Exercise per Week: 0 days    Minutes of Exercise per Session: 0 min  Stress: No Stress Concern Present (07/15/2022)   [place]    Feeling of Stress : Not at all  Social Connections: Moderately Isolated (07/15/2022)   Social Connection and Isolation Panel [NHANES]    Frequency of Communication with Friends and Family: More than three times a week    Frequency of Social Gatherings with Friends and Family: Patient refused    Attends Religious Services: Never    Marine scientist or Organizations: No    Attends Music therapist: Never    Marital Status: Married    Tobacco Counseling Counseling given: Not Answered   Clinical Intake:  Pre-visit preparation completed: Yes  Pain : No/denies pain Pain Score: 0-No pain     BMI - recorded: 34.68 Nutritional Status: BMI > 30   Obese Nutritional Risks: None Diabetes: Yes CBG done?: No Did pt. bring in CBG monitor from home?: No  How often do you need to have someone help you when you read instructions, pamphlets, or other written materials from your doctor or pharmacy?: 1 - Never What is the last grade level you completed in school?: HSG  Diabetic: No; prediabetes  Interpreter Needed?: No  Information entered by ::  Lisette Abu, LPN.   Activities of Daily Living    07/15/2022    2:09 PM  In your present state of health, do you have any difficulty performing the following activities:  Hearing? 0  Vision? 0  Difficulty concentrating or making decisions? 0  Walking or climbing stairs? 0  Dressing or bathing? 0  Doing errands, shopping? 0  Preparing Food and eating ? N  Using the Toilet? N  In the past six months, have you accidently leaked urine? N  Do you have problems with loss of bowel control? N  Managing your Medications? N  Managing your Finances? N  Housekeeping or managing your Housekeeping? N    Patient Care Team: Plotnikov, Evie Lacks, MD as PCP - General Claiborne Billings Joyice Faster, MD (Cardiology) Mauri Pole, MD as Consulting Physician (Gastroenterology) Luberta Mutter, MD as Consulting Physician (Ophthalmology)  Indicate any recent Medical Services you may have received from other than [facility] providers in the past year (date may be approximate).     Assessment:   This is a routine wellness examination for Evalisse.  Hearing/Vision screen Hearing Screening - Comments:: Denies hearing difficulties   Vision Screening - Comments:: Wears rx glasses - up to date with routine eye exams with Luberta Mutter, MD.   Dietary issues and exercise activities discussed: Current Exercise Habits: The patient does not participate in regular exercise at present, Exercise limited by: Other - see comments (chronic fatigue)   Goals Addressed             This Visit's Progress    Client  understands the importance of follow-up with providers by attending scheduled visits        Depression Screen    07/15/2022    1:59 PM 06/25/2021    2:56 PM 02/05/2021    9:06 AM 06/24/2020    9:56 AM 04/11/2020    3:56 PM 11/24/2017    1:49 PM  PHQ 2/9 Scores  PHQ - 2 Score 0 0 0 0 0 0  PHQ- 9 Score 0  0       Fall Risk    07/15/2022    1:59 PM 06/25/2021    2:59 PM 06/24/2020    9:54 AM  Fall Risk   Falls in the past year? 0 0 0  Number falls in past yr: 0 0 0  Injury with Fall? 0 0 0  Risk for fall due to : No Fall Risks No Fall Risks   Follow up  Falls evaluation completed     FALL RISK PREVENTION PERTAINING TO THE HOME:  Any stairs in or around the home? Yes  If so, are there any without handrails? No  Home free of loose throw rugs in walkways, pet beds, electrical cords, etc? Yes  Adequate lighting in your home to reduce risk of falls? Yes   ASSISTIVE DEVICES UTILIZED TO PREVENT FALLS:  Life alert? No  Use of a cane, walker or w/c? No  Grab bars in the bathroom? No  Shower chair or bench in shower? No  Elevated toilet seat or a handicapped toilet? No   TIMED UP AND GO:  Was the test performed? Yes .  Length of time to ambulate 10 feet: 6 sec.   Gait steady and fast without use of assistive device  Cognitive Function:        07/15/2022    2:10 PM  6CIT Screen  What Year? 0 points  What month? 0 points  What time? 0  points  Count back from 20 0 points  Months in reverse 0 points  Repeat phrase 0 points  Total Score 0 points    Immunizations Immunization History  Administered Date(s) Administered   PFIZER(Purple Top)SARS-COV-2 Vaccination 12/01/2019, 12/25/2019, 08/04/2020   Tdap 06/01/2016    TDAP status: Up to date  Flu Vaccine status: Declined, Education has been provided regarding the importance of this vaccine but patient still declined. Advised may receive this vaccine at local pharmacy or Health Dept. Aware to provide a copy of the  vaccination record if obtained from local pharmacy or Health Dept. Verbalized acceptance and understanding.  Pneumococcal vaccine status: Declined,  Education has been provided regarding the importance of this vaccine but patient still declined. Advised may receive this vaccine at local pharmacy or Health Dept. Aware to provide a copy of the vaccination record if obtained from local pharmacy or Health Dept. Verbalized acceptance and understanding.   Covid-19 vaccine status: Completed vaccines  Qualifies for Shingles Vaccine? Yes   Zostavax completed No   Shingrix Completed?: No.    Education has been provided regarding the importance of this vaccine. Patient has been advised to call insurance company to determine out of pocket expense if they have not yet received this vaccine. Advised may also receive vaccine at local pharmacy or Health Dept. Verbalized acceptance and understanding.  Screening Tests Health Maintenance  Topic Date Due   FOOT EXAM  Never done   OPHTHALMOLOGY EXAM  Never done   Diabetic kidney evaluation - Urine ACR  Never done   Hepatitis C Screening  Never done   Pneumonia Vaccine 23+ Years old (1 - PCV) Never done   DEXA SCAN  Never done   COVID-19 Vaccine (4 - Pfizer risk series) 09/29/2020   COLONOSCOPY (Pts 45-36yr Insurance coverage will need to be confirmed)  04/27/2022   HEMOGLOBIN A1C  09/09/2022   MAMMOGRAM  10/23/2022   Diabetic kidney evaluation - GFR measurement  03/11/2023   Medicare Annual Wellness (AWV)  08/15/2023   TETANUS/TDAP  06/01/2026   HPV VACCINES  Aged Out   INFLUENZA VACCINE  Discontinued   Zoster Vaccines- Shingrix  Discontinued    Health Maintenance  Health Maintenance Due  Topic Date Due   FOOT EXAM  Never done   OPHTHALMOLOGY EXAM  Never done   Diabetic kidney evaluation - Urine ACR  Never done   Hepatitis C Screening  Never done   Pneumonia Vaccine 68 Years old (1 - PCV) Never done   DEXA SCAN  Never done   COVID-19 Vaccine (4  - Pfizer risk series) 09/29/2020   COLONOSCOPY (Pts 45-424yrInsurance coverage will need to be confirmed)  04/27/2022    Colorectal cancer screening: Type of screening: Colonoscopy. Completed 04/27/2012. Repeat every 10 years  Mammogram status: Completed 10/23/2020. Repeat every year EVERY 1-2 YEARS)  Bone Density Status: never done  Lung Cancer Screening: (Low Dose CT Chest recommended if Age 84103-80ears, 30 pack-year currently smoking OR have quit w/in 15years.) does not qualify.   Lung Cancer Screening Referral: no  Additional Screening:  Hepatitis C Screening: does qualify; Completed no  Vision Screening: Recommended annual ophthalmology exams for early detection of glaucoma and other disorders of the eye. Is the patient up to date with their annual eye exam?  Yes  Who is the provider or what is the name of the office in which the patient attends annual eye exams? Ch[facility] MutterMD. If pt is not established with a provider, would  they like to be referred to a provider to establish care? No .   Dental Screening: Recommended annual dental exams for proper oral hygiene  Community Resource Referral / Chronic Care Management: CRR required this visit?  No   CCM required this visit?  No      Plan:     I have personally reviewed and noted the following in the patient's chart:   Medical and social history Use of alcohol, tobacco or illicit drugs  Current medications and supplements including opioid prescriptions. Patient is not currently taking opioid prescriptions. Functional ability and status Nutritional status Physical activity Advanced directives List of other physicians Hospitalizations, surgeries, and ER visits in previous 12 months Vitals Screenings to include cognitive, depression, and falls Referrals and appointments  In addition, I have reviewed and discussed with patient certain preventive protocols, quality metrics, and best practice recommendations. A  written personalized care plan for preventive services as well as general preventive health recommendations were provided to patient.     Sheral Flow, LPN   79/39/0300   Nurse Notes: N/A   Medical screening examination/treatment/procedure(s) were performed by non-physician practitioner and as supervising physician I was immediately available for consultation/collaboration.  I agree with above. Lew Dawes, MD

## 2022-07-15 NOTE — Assessment & Plan Note (Signed)
Wt Readings from Last 3 Encounters:  07/15/22 182 lb 9.6 oz (82.8 kg)  03/10/22 183 lb (83 kg)  01/08/22 185 lb (83.9 kg)

## 2022-09-18 ENCOUNTER — Encounter: Payer: Self-pay | Admitting: Internal Medicine

## 2022-09-18 ENCOUNTER — Ambulatory Visit (INDEPENDENT_AMBULATORY_CARE_PROVIDER_SITE_OTHER): Payer: PPO | Admitting: Internal Medicine

## 2022-09-18 VITALS — BP 128/80 | HR 73 | Temp 98.9°F | Ht 62.0 in | Wt 181.0 lb

## 2022-09-18 DIAGNOSIS — I10 Essential (primary) hypertension: Secondary | ICD-10-CM

## 2022-09-18 DIAGNOSIS — E669 Obesity, unspecified: Secondary | ICD-10-CM | POA: Diagnosis not present

## 2022-09-18 DIAGNOSIS — Z6835 Body mass index (BMI) 35.0-35.9, adult: Secondary | ICD-10-CM | POA: Diagnosis not present

## 2022-09-18 DIAGNOSIS — E559 Vitamin D deficiency, unspecified: Secondary | ICD-10-CM

## 2022-09-18 DIAGNOSIS — K2100 Gastro-esophageal reflux disease with esophagitis, without bleeding: Secondary | ICD-10-CM

## 2022-09-18 DIAGNOSIS — E1169 Type 2 diabetes mellitus with other specified complication: Secondary | ICD-10-CM | POA: Diagnosis not present

## 2022-09-18 NOTE — Assessment & Plan Note (Signed)
Re-start amlodipine, Bystolic, as needed clonidine if high BP

## 2022-09-18 NOTE — Assessment & Plan Note (Signed)
Cont w/metformin 

## 2022-09-18 NOTE — Assessment & Plan Note (Signed)
?  Chronic  ?Cont on Protonix ?

## 2022-09-18 NOTE — Assessment & Plan Note (Signed)
On Vit D 

## 2022-09-18 NOTE — Progress Notes (Signed)
Subjective:  Patient ID: Denise Donaldson, female    DOB: Mar 08, 1956  Age: 66 y.o. MRN: [id number]  CC: Follow-up   HPI Denise Donaldson presents for HTN, GERD, DM M-in-law just died.   Outpatient Medications Prior to Visit  Medication Sig Dispense Refill   amLODipine (NORVASC) 2.5 MG tablet Take 1 tablet (2.5 mg total) by mouth 2 (two) times daily. 180 tablet 3   b complex vitamins tablet Take 1 tablet by mouth daily. 100 tablet 3   Cholecalciferol (VITAMIN D3) 50 MCG (2000 UT) capsule Take 1 capsule (2,000 Units total) by mouth daily. 100 capsule 3   cloNIDine (CATAPRES) 0.1 MG tablet Take 1 tablet (0.1 mg total) by mouth 2 (two) times daily as needed (for blood pressure over 180). 60 tablet 1   metFORMIN (GLUCOPHAGE) 500 MG tablet Take 1 tablet by mouth once daily with breakfast 90 tablet 3   nebivolol (BYSTOLIC) 5 MG tablet Take 1 tablet (5 mg total) by mouth in the morning and at bedtime. 180 tablet 3   pantoprazole (PROTONIX) 40 MG tablet Take 1 tablet (40 mg total) by mouth daily. PRN 90 tablet 3   naproxen (NAPROSYN) 500 MG tablet Take 1 tablet (500 mg total) by mouth 2 (two) times daily as needed for moderate pain. (Patient not taking: Reported on 09/18/2022) 60 tablet 2   No facility-administered medications prior to visit.    ROS: Review of Systems  Constitutional:  Positive for unexpected weight change. Negative for activity change, appetite change, chills and fatigue.  HENT:  Negative for congestion, mouth sores and sinus pressure.   Eyes:  Negative for visual disturbance.  Respiratory:  Negative for cough and chest tightness.   Gastrointestinal:  Negative for abdominal pain and nausea.  Genitourinary:  Negative for difficulty urinating, frequency and vaginal pain.  Musculoskeletal:  Negative for back pain and gait problem.  Skin:  Negative for pallor and rash.  Neurological:  Negative for dizziness, tremors, weakness, numbness and headaches.   Psychiatric/Behavioral:  Negative for confusion, sleep disturbance and suicidal ideas. The patient is not nervous/anxious.     Objective:  BP 128/80 (BP Location: Right Arm, Patient Position: Sitting, Cuff Size: Normal)   Pulse 73   Temp 98.9 F (37.2 C) (Oral)   Ht '5\' 2"'$  (1.575 m)   Wt 181 lb (82.1 kg)   SpO2 98%   BMI 33.11 kg/m   BP Readings from Last 3 Encounters:  09/18/22 128/80  07/15/22 112/70  07/15/22 112/70    Wt Readings from Last 3 Encounters:  09/18/22 181 lb (82.1 kg)  07/15/22 189 lb 9.6 oz (86 kg)  07/15/22 182 lb 9.6 oz (82.8 kg)    Physical Exam Constitutional:      General: She is not in acute distress.    Appearance: She is well-developed. She is obese.  HENT:     Head: Normocephalic.     Right Ear: External ear normal.     Left Ear: External ear normal.     Nose: Nose normal.  Eyes:     General:        Right eye: No discharge.        Left eye: No discharge.     Conjunctiva/sclera: Conjunctivae normal.     Pupils: Pupils are equal, round, and reactive to light.  Neck:     Thyroid: No thyromegaly.     Vascular: No JVD.     Trachea: No tracheal deviation.  Cardiovascular:     Rate  and Rhythm: Normal rate and regular rhythm.     Heart sounds: Normal heart sounds.  Pulmonary:     Effort: No respiratory distress.     Breath sounds: No stridor. No wheezing.  Abdominal:     General: Bowel sounds are normal. There is no distension.     Palpations: Abdomen is soft. There is no mass.     Tenderness: There is no abdominal tenderness. There is no guarding or rebound.  Musculoskeletal:        General: No tenderness.     Cervical back: Normal range of motion and neck supple. No rigidity.  Lymphadenopathy:     Cervical: No cervical adenopathy.  Skin:    Findings: No erythema or rash.  Neurological:     Mental Status: She is oriented to person, place, and time.     Cranial Nerves: No cranial nerve deficit.     Motor: No abnormal muscle tone.      Coordination: Coordination normal.     Deep Tendon Reflexes: Reflexes normal.  Psychiatric:        Behavior: Behavior normal.        Thought Content: Thought content normal.        Judgment: Judgment normal.     Lab Results  Component Value Date   WBC 5.3 03/10/2022   HGB 14.6 03/10/2022   HCT 44.3 03/10/2022   PLT 365.0 03/10/2022   GLUCOSE 101 (H) 03/10/2022   CHOL 244 (H) 03/10/2022   TRIG 140.0 03/10/2022   HDL 67.90 03/10/2022   LDLCALC 148 (H) 03/10/2022   ALT 34 03/10/2022   AST 26 03/10/2022   NA 141 03/10/2022   K 5.3 (H) 03/10/2022   CL 106 03/10/2022   CREATININE 0.74 03/10/2022   BUN 11 03/10/2022   CO2 27 03/10/2022   TSH 0.99 03/10/2022   HGBA1C 6.4 03/10/2022    MM DIAG BREAST TOMO BILATERAL  Result Date: 10/23/2020 CLINICAL DATA:  Diffuse intermittent lateral right breast pain. EXAM: DIGITAL DIAGNOSTIC BILATERAL MAMMOGRAM WITH TOMO AND CAD TECHNIQUE: Bilateral digital diagnostic mammography and breast tomosynthesis was performed. Digital images of the breasts were evaluated with computer-aided detection. COMPARISON:  Previous exam(s). ACR Breast Density Category b: There are scattered areas of fibroglandular density. FINDINGS: No suspicious masses, calcifications, or distortion are identified in either breast. IMPRESSION: No mammographic evidence of malignancy. RECOMMENDATION: Annual screening mammography. I have discussed the findings and recommendations with the patient. If applicable, a reminder letter will be sent to the patient regarding the next appointment. BI-RADS CATEGORY  1: Negative. Electronically Signed   By: [name] M.D   On: 10/23/2020 10:49    Assessment & Plan:   Problem List Items Addressed This Visit     Vitamin D deficiency    On Vit D      Obesity    Better       GERD - Primary    Chronic Cont on Protonix      Essential hypertension    Re-start amlodipine, Bystolic, as needed clonidine if high BP      Diabetes  mellitus type 2 in obese (HCC)    Cont w/metformin          No orders of the defined types were placed in this encounter.     Follow-up: Return in about 3 months (around 12/18/2022) for a follow-up visit.  Walker Kehr, MD

## 2022-09-18 NOTE — Assessment & Plan Note (Signed)
Better  

## 2022-11-03 ENCOUNTER — Other Ambulatory Visit: Payer: Self-pay | Admitting: Internal Medicine

## 2022-12-15 ENCOUNTER — Encounter: Payer: Self-pay | Admitting: Internal Medicine

## 2022-12-15 ENCOUNTER — Ambulatory Visit (INDEPENDENT_AMBULATORY_CARE_PROVIDER_SITE_OTHER): Payer: Medicare HMO | Admitting: Internal Medicine

## 2022-12-15 VITALS — BP 110/82 | HR 60 | Temp 98.2°F | Ht 62.0 in | Wt 178.0 lb

## 2022-12-15 DIAGNOSIS — Z1211 Encounter for screening for malignant neoplasm of colon: Secondary | ICD-10-CM | POA: Diagnosis not present

## 2022-12-15 DIAGNOSIS — R202 Paresthesia of skin: Secondary | ICD-10-CM | POA: Diagnosis not present

## 2022-12-15 DIAGNOSIS — E559 Vitamin D deficiency, unspecified: Secondary | ICD-10-CM

## 2022-12-15 DIAGNOSIS — E669 Obesity, unspecified: Secondary | ICD-10-CM | POA: Diagnosis not present

## 2022-12-15 DIAGNOSIS — R739 Hyperglycemia, unspecified: Secondary | ICD-10-CM | POA: Diagnosis not present

## 2022-12-15 DIAGNOSIS — R252 Cramp and spasm: Secondary | ICD-10-CM

## 2022-12-15 DIAGNOSIS — L659 Nonscarring hair loss, unspecified: Secondary | ICD-10-CM | POA: Diagnosis not present

## 2022-12-15 LAB — COMPREHENSIVE METABOLIC PANEL
ALT: 28 U/L (ref 0–35)
AST: 23 U/L (ref 0–37)
Albumin: 4.3 g/dL (ref 3.5–5.2)
Alkaline Phosphatase: 58 U/L (ref 39–117)
BUN: 10 mg/dL (ref 6–23)
CO2: 27 mEq/L (ref 19–32)
Calcium: 9.7 mg/dL (ref 8.4–10.5)
Chloride: 105 mEq/L (ref 96–112)
Creatinine, Ser: 0.67 mg/dL (ref 0.40–1.20)
GFR: 89.45 mL/min (ref >60.00)
Glucose, Bld: 80 mg/dL (ref 70–99)
Potassium: 4.3 mEq/L (ref 3.5–5.1)
Sodium: 139 mEq/L (ref 135–145)
Total Bilirubin: 0.5 mg/dL (ref 0.2–1.2)
Total Protein: 7.5 g/dL (ref 6.0–8.3)

## 2022-12-15 LAB — CBC WITH DIFFERENTIAL/PLATELET
Basophils Absolute: 0 10*3/uL (ref 0.0–0.1)
Basophils Relative: 0.8 % (ref 0.0–3.0)
Eosinophils Absolute: 0.1 10*3/uL (ref 0.0–0.7)
Eosinophils Relative: 2 % (ref 0.0–5.0)
HCT: 43.9 % (ref 36.0–46.0)
Hemoglobin: 14.8 g/dL (ref 12.0–15.0)
Lymphocytes Relative: 40.5 % (ref 12.0–46.0)
Lymphs Abs: 2.6 10*3/uL (ref 0.7–4.0)
MCHC: 33.7 g/dL (ref 30.0–36.0)
MCV: 91.5 fl (ref 78.0–100.0)
Monocytes Absolute: 0.6 10*3/uL (ref 0.1–1.0)
Monocytes Relative: 8.9 % (ref 3.0–12.0)
Neutro Abs: 3.1 10*3/uL (ref 1.4–7.7)
Neutrophils Relative %: 47.8 % (ref 43.0–77.0)
Platelets: 384 10*3/uL (ref 150.0–400.0)
RBC: 4.79 Mil/uL (ref 3.87–5.11)
RDW: 13.2 % (ref 11.5–15.5)
WBC: 6.5 10*3/uL (ref 4.0–10.5)

## 2022-12-15 LAB — HEMOGLOBIN A1C: Hgb A1c MFr Bld: 6.2 % (ref 4.6–6.5)

## 2022-12-15 LAB — VITAMIN B12: Vitamin B-12: 416 pg/mL (ref 211–911)

## 2022-12-15 LAB — T4, FREE: Free T4: 1.32 ng/dL (ref 0.60–1.60)

## 2022-12-15 LAB — TSH: TSH: 0.99 u[IU]/mL (ref 0.35–5.50)

## 2022-12-15 LAB — VITAMIN D 25 HYDROXY (VIT D DEFICIENCY, FRACTURES): VITD: 20.85 ng/mL — ABNORMAL LOW (ref 30.00–100.00)

## 2022-12-15 LAB — MAGNESIUM: Magnesium: 2.1 mg/dL (ref 1.5–2.5)

## 2022-12-15 MED ORDER — VITAMIN D3 50 MCG (2000 UT) PO CAPS: 2000.0000 [IU] | ORAL_CAPSULE | Freq: Every day | ORAL | 3 refills | Status: DC

## 2022-12-15 MED ORDER — NEBIVOLOL HCL 5 MG PO TABS: ORAL_TABLET | ORAL | 11 refills | Status: DC

## 2022-12-15 MED ORDER — METFORMIN HCL 500 MG PO TABS: 500.0000 mg | ORAL_TABLET | Freq: Every day | ORAL | 3 refills | Status: DC

## 2022-12-15 NOTE — Assessment & Plan Note (Signed)
Check A1c. 

## 2022-12-15 NOTE — Assessment & Plan Note (Signed)
Check CMET, CBC Try Magnesium oil spray prn

## 2022-12-15 NOTE — Patient Instructions (Addendum)
Try Magnesium oil spray for cramps 

## 2022-12-15 NOTE — Assessment & Plan Note (Addendum)
Discussed Check FT4, B12, Vit D Given prescription for weekly vitamin D.  After  vitamin D prescription is finished, get "Vit D3+K2 " vitamin and take daily.  Continue with a B complex.  This should help with hair and nails. [name]..

## 2022-12-15 NOTE — Progress Notes (Signed)
Subjective:  Patient ID: Denise Donaldson, female    DOB: 01-11-1956  Age: 67 y.o. MRN: [id number]  CC: Follow-up (3 MNTH F/U)   HPI Denise Donaldson presents for hair loss C/o leg cramps, worse in supine position  Outpatient Medications Prior to Visit  Medication Sig Dispense Refill   amLODipine (NORVASC) 2.5 MG tablet Take 1 tablet (2.5 mg total) by mouth 2 (two) times daily. 180 tablet 3   b complex vitamins tablet Take 1 tablet by mouth daily. 100 tablet 3   cloNIDine (CATAPRES) 0.1 MG tablet Take 1 tablet (0.1 mg total) by mouth 2 (two) times daily as needed (for blood pressure over 180). 60 tablet 1   pantoprazole (PROTONIX) 40 MG tablet Take 1 tablet (40 mg total) by mouth daily. PRN 90 tablet 3   Cholecalciferol (VITAMIN D3) 50 MCG (2000 UT) capsule Take 1 capsule (2,000 Units total) by mouth daily. 100 capsule 3   metFORMIN (GLUCOPHAGE) 500 MG tablet Take 1 tablet (500 mg total) by mouth daily with breakfast. Annual appt due in April must see provider for future refills 90 tablet 0   nebivolol (BYSTOLIC) 5 MG tablet Take 1 by mouth in the morning and at bedtime 60 tablet 3   naproxen (NAPROSYN) 500 MG tablet Take 1 tablet (500 mg total) by mouth 2 (two) times daily as needed for moderate pain. (Patient not taking: Reported on 09/18/2022) 60 tablet 2   No facility-administered medications prior to visit.    ROS: Review of Systems  Constitutional:  Positive for unexpected weight change. Negative for activity change, appetite change, chills and fatigue.  HENT:  Negative for congestion, mouth sores and sinus pressure.   Eyes:  Negative for visual disturbance.  Respiratory:  Negative for cough and chest tightness.   Gastrointestinal:  Negative for abdominal pain and nausea.  Genitourinary:  Negative for difficulty urinating, frequency and vaginal pain.  Musculoskeletal:  Negative for back pain and gait problem.  Skin:  Negative for pallor and rash.  Neurological:   Negative for dizziness, tremors, weakness, numbness and headaches.  Psychiatric/Behavioral:  Negative for confusion and sleep disturbance.     Objective:  BP 110/82 (BP Location: Right Arm, Patient Position: Sitting, Cuff Size: Large)   Pulse 60   Temp 98.2 F (36.8 C) (Oral)   Ht 5\' 2"  (1.575 m)   Wt 178 lb (80.7 kg)   SpO2 98%   BMI 32.56 kg/m   BP Readings from Last 3 Encounters:  12/15/22 110/82  09/18/22 128/80  07/15/22 112/70    Wt Readings from Last 3 Encounters:  12/15/22 178 lb (80.7 kg)  09/18/22 181 lb (82.1 kg)  07/15/22 189 lb 9.6 oz (86 kg)    Physical Exam Constitutional:      General: She is not in acute distress.    Appearance: She is well-developed. She is obese.  HENT:     Head: Normocephalic.     Right Ear: External ear normal.     Left Ear: External ear normal.     Nose: Nose normal.  Eyes:     General:        Right eye: No discharge.        Left eye: No discharge.     Conjunctiva/sclera: Conjunctivae normal.     Pupils: Pupils are equal, round, and reactive to light.  Neck:     Thyroid: No thyromegaly.     Vascular: No JVD.     Trachea: No tracheal deviation.  Cardiovascular:  Rate and Rhythm: Normal rate and regular rhythm.     Heart sounds: Normal heart sounds.  Pulmonary:     Effort: No respiratory distress.     Breath sounds: No stridor. No wheezing.  Abdominal:     General: Bowel sounds are normal. There is no distension.     Palpations: Abdomen is soft. There is no mass.     Tenderness: There is no abdominal tenderness. There is no guarding or rebound.  Musculoskeletal:        General: No tenderness.     Cervical back: Normal range of motion and neck supple. No rigidity.  Lymphadenopathy:     Cervical: No cervical adenopathy.  Skin:    Findings: No erythema or rash.  Neurological:     Cranial Nerves: No cranial nerve deficit.     Motor: No abnormal muscle tone.     Coordination: Coordination normal.     Deep Tendon  Reflexes: Reflexes normal.  Psychiatric:        Behavior: Behavior normal.        Thought Content: Thought content normal.        Judgment: Judgment normal.   Thin nails  Lab Results  Component Value Date   WBC 6.5 12/15/2022   HGB 14.8 12/15/2022   HCT 43.9 12/15/2022   PLT 384.0 12/15/2022   GLUCOSE 80 12/15/2022   CHOL 244 (H) 03/10/2022   TRIG 140.0 03/10/2022   HDL 67.90 03/10/2022   LDLCALC 148 (H) 03/10/2022   ALT 28 12/15/2022   AST 23 12/15/2022   NA 139 12/15/2022   K 4.3 12/15/2022   CL 105 12/15/2022   CREATININE 0.67 12/15/2022   BUN 10 12/15/2022   CO2 27 12/15/2022   TSH 0.99 12/15/2022   HGBA1C 6.2 12/15/2022    MM DIAG BREAST TOMO BILATERAL  Result Date: 10/23/2020 CLINICAL DATA:  Diffuse intermittent lateral right breast pain. EXAM: DIGITAL DIAGNOSTIC BILATERAL MAMMOGRAM WITH TOMO AND CAD TECHNIQUE: Bilateral digital diagnostic mammography and breast tomosynthesis was performed. Digital images of the breasts were evaluated with computer-aided detection. COMPARISON:  Previous exam(s). ACR Breast Density Category b: There are scattered areas of fibroglandular density. FINDINGS: No suspicious masses, calcifications, or distortion are identified in either breast. IMPRESSION: No mammographic evidence of malignancy. RECOMMENDATION: Annual screening mammography. I have discussed the findings and recommendations with the patient. If applicable, a reminder letter will be sent to the patient regarding the next appointment. BI-RADS CATEGORY  1: Negative. Electronically Signed   By: [name] M.D   On: 10/23/2020 10:49    Assessment & Plan:   Problem List Items Addressed This Visit       Endocrine   Diabetes mellitus type 2 in obese ([place]) - Primary    Check A1c      Relevant Medications   metFORMIN (GLUCOPHAGE) 500 MG tablet   Other Relevant Orders   Hemoglobin A1c (Completed)     Other   Hair loss    Discussed Check FT4, B12, Vit D Given  prescription for weekly vitamin D.  After  vitamin D prescription is finished, get "Vit D3+K2 " vitamin and take daily.  Continue with a B complex.  This should help with hair and nails. ...       Relevant Orders   CBC with Differential/Platelet (Completed)   TSH (Completed)   T4, free (Completed)   Vitamin B12 (Completed)   VITAMIN D 25 Hydroxy (Vit-D Deficiency, Fractures) (Completed)   Magnesium (Completed)  Comprehensive metabolic panel (Completed)   Hyperglycemia    Check A1c      Leg cramps    Check CMET, CBC Try Magnesium oil spray prn      Relevant Orders   Hemoglobin A1c (Completed)   CBC with Differential/Platelet (Completed)   TSH (Completed)   T4, free (Completed)   Vitamin B12 (Completed)   VITAMIN D 25 Hydroxy (Vit-D Deficiency, Fractures) (Completed)   Magnesium (Completed)   Comprehensive metabolic panel (Completed)   Paresthesia   Relevant Orders   Vitamin B12 (Completed)   Vitamin D deficiency    Given prescription for weekly vitamin D.  After  vitamin D prescription is finished, get "Vit D3+K2 " vitamin and take daily.  Continue with a B complex.  This should help with hair and nails. ...       Relevant Orders   VITAMIN D 25 Hydroxy (Vit-D Deficiency, Fractures) (Completed)   Other Visit Diagnoses     Colon cancer screening       Relevant Orders   Ambulatory referral to Gastroenterology         Meds ordered this encounter  Medications   Cholecalciferol (VITAMIN D3) 50 MCG (2000 UT) CAPS    Sig: Take 1 capsule (2,000 Units total) by mouth daily.    Dispense:  100 capsule    Refill:  3   metFORMIN (GLUCOPHAGE) 500 MG tablet    Sig: Take 1 tablet (500 mg total) by mouth daily with breakfast. Annual appt due in April must see provider for future refills    Dispense:  90 tablet    Refill:  3   nebivolol (BYSTOLIC) 5 MG tablet    Sig: Take 1 by mouth in the morning and at bedtime    Dispense:  60 tablet    Refill:  11      Follow-up:  Return in about 3 months (around 03/17/2023) for a follow-up visit.  Walker Kehr, MD

## 2022-12-16 ENCOUNTER — Other Ambulatory Visit: Payer: Self-pay | Admitting: Internal Medicine

## 2022-12-16 MED ORDER — VITAMIN D (ERGOCALCIFEROL) 1.25 MG (50000 UNIT) PO CAPS: 50000.0000 [IU] | ORAL_CAPSULE | ORAL | 0 refills | Status: DC

## 2022-12-16 NOTE — Assessment & Plan Note (Signed)
Given prescription for weekly vitamin D.  After  vitamin D prescription is finished, get "Vit D3+K2 " vitamin and take daily.  Continue with a B complex.  This should help with hair and nails. [name]..

## 2023-03-17 ENCOUNTER — Encounter: Payer: Self-pay | Admitting: Internal Medicine

## 2023-03-17 ENCOUNTER — Ambulatory Visit: Payer: No Typology Code available for payment source | Admitting: Internal Medicine

## 2023-03-17 VITALS — BP 122/80 | HR 72 | Temp 97.9°F | Ht 62.0 in | Wt 181.0 lb

## 2023-03-17 DIAGNOSIS — E559 Vitamin D deficiency, unspecified: Secondary | ICD-10-CM | POA: Diagnosis not present

## 2023-03-17 DIAGNOSIS — K2100 Gastro-esophageal reflux disease with esophagitis, without bleeding: Secondary | ICD-10-CM | POA: Diagnosis not present

## 2023-03-17 DIAGNOSIS — I10 Essential (primary) hypertension: Secondary | ICD-10-CM | POA: Diagnosis not present

## 2023-03-17 DIAGNOSIS — G500 Trigeminal neuralgia: Secondary | ICD-10-CM

## 2023-03-17 DIAGNOSIS — Z23 Encounter for immunization: Secondary | ICD-10-CM | POA: Diagnosis not present

## 2023-03-17 DIAGNOSIS — E1169 Type 2 diabetes mellitus with other specified complication: Secondary | ICD-10-CM | POA: Diagnosis not present

## 2023-03-17 MED ORDER — AMLODIPINE BESYLATE 2.5 MG PO TABS: 2.5000 mg | ORAL_TABLET | Freq: Two times a day (BID) | ORAL | 3 refills | Status: AC

## 2023-03-17 MED ORDER — NAPROXEN 500 MG PO TABS: 500.0000 mg | ORAL_TABLET | Freq: Two times a day (BID) | ORAL | 0 refills | Status: DC | PRN

## 2023-03-17 NOTE — Assessment & Plan Note (Signed)
On Vit D 

## 2023-03-17 NOTE — Assessment & Plan Note (Signed)
Check A1c. 

## 2023-03-17 NOTE — Assessment & Plan Note (Signed)
?  Chronic  ?Cont on Protonix ?

## 2023-03-17 NOTE — Progress Notes (Signed)
Subjective:  Patient ID: Denise Donaldson, female    DOB: 04/08/1956  Age: 67 y.o. MRN: [id number]  CC: Follow-up (3 MNTH F/U)   HPI Denise Donaldson presents for HTN, GERD, DM, Vit D def  Outpatient Medications Prior to Visit  Medication Sig Dispense Refill   b complex vitamins tablet Take 1 tablet by mouth daily. 100 tablet 3   Cholecalciferol (VITAMIN D3) 50 MCG (2000 UT) CAPS Take 1 capsule (2,000 Units total) by mouth daily. 100 capsule 3   cloNIDine (CATAPRES) 0.1 MG tablet Take 1 tablet (0.1 mg total) by mouth 2 (two) times daily as needed (for blood pressure over 180). 60 tablet 1   metFORMIN (GLUCOPHAGE) 500 MG tablet Take 1 tablet (500 mg total) by mouth daily with breakfast. 90 tablet 3   nebivolol (BYSTOLIC) 5 MG tablet Take 1 by mouth in the morning and at bedtime 60 tablet 11   pantoprazole (PROTONIX) 40 MG tablet Take 1 tablet (40 mg total) by mouth daily. PRN 90 tablet 3   amLODipine (NORVASC) 2.5 MG tablet Take 1 tablet (2.5 mg total) by mouth 2 (two) times daily. 180 tablet 3   naproxen (NAPROSYN) 500 MG tablet Take 1 tablet (500 mg total) by mouth 2 (two) times daily as needed for moderate pain. (Patient not taking: Reported on 09/18/2022) 60 tablet 2   Vitamin D, Ergocalciferol, (DRISDOL) 1.25 MG (50000 UNIT) CAPS capsule Take 1 capsule (50,000 Units total) by mouth every 7 (seven) days. 8 capsule 0   No facility-administered medications prior to visit.    ROS: Review of Systems  Constitutional:  Positive for unexpected weight change. Negative for activity change, appetite change, chills and fatigue.  HENT:  Negative for congestion, mouth sores and sinus pressure.   Eyes:  Negative for visual disturbance.  Respiratory:  Negative for cough and chest tightness.   Gastrointestinal:  Negative for abdominal pain and nausea.  Genitourinary:  Negative for difficulty urinating, frequency and vaginal pain.  Musculoskeletal:  Positive for arthralgias. Negative for  back pain and gait problem.  Skin:  Negative for pallor and rash.  Neurological:  Negative for dizziness, tremors, weakness, numbness and headaches.  Hematological:  Bruises/bleeds easily.  Psychiatric/Behavioral:  Negative for confusion and sleep disturbance.     Objective:  BP 122/80 (BP Location: Right Arm, Patient Position: Sitting, Cuff Size: Large)   Pulse 72   Temp 97.9 F (36.6 C) (Oral)   Ht 5\' 2"  (1.575 m)   Wt 181 lb (82.1 kg)   SpO2 96%   BMI 33.11 kg/m   BP Readings from Last 3 Encounters:  03/17/23 122/80  12/15/22 110/82  09/18/22 128/80    Wt Readings from Last 3 Encounters:  03/17/23 181 lb (82.1 kg)  12/15/22 178 lb (80.7 kg)  09/18/22 181 lb (82.1 kg)    Physical Exam Constitutional:      General: She is not in acute distress.    Appearance: She is well-developed. She is obese.  HENT:     Head: Normocephalic.     Right Ear: External ear normal.     Left Ear: External ear normal.     Nose: Nose normal.  Eyes:     General:        Right eye: No discharge.        Left eye: No discharge.     Conjunctiva/sclera: Conjunctivae normal.     Pupils: Pupils are equal, round, and reactive to light.  Neck:     Thyroid:  No thyromegaly.     Vascular: No JVD.     Trachea: No tracheal deviation.  Cardiovascular:     Rate and Rhythm: Normal rate and regular rhythm.     Heart sounds: Normal heart sounds.  Pulmonary:     Effort: No respiratory distress.     Breath sounds: No stridor. No wheezing.  Abdominal:     General: Bowel sounds are normal. There is no distension.     Palpations: Abdomen is soft. There is no mass.     Tenderness: There is no abdominal tenderness. There is no guarding or rebound.  Musculoskeletal:        General: No tenderness.     Cervical back: Normal range of motion and neck supple. No rigidity.  Lymphadenopathy:     Cervical: No cervical adenopathy.  Skin:    Findings: No erythema or rash.  Neurological:     Mental Status: She  is oriented to person, place, and time.     Cranial Nerves: No cranial nerve deficit.     Motor: No abnormal muscle tone.     Coordination: Coordination normal.     Deep Tendon Reflexes: Reflexes normal.  Psychiatric:        Behavior: Behavior normal.        Thought Content: Thought content normal.        Judgment: Judgment normal.     Lab Results  Component Value Date   WBC 6.5 12/15/2022   HGB 14.8 12/15/2022   HCT 43.9 12/15/2022   PLT 384.0 12/15/2022   GLUCOSE 80 12/15/2022   CHOL 244 (H) 03/10/2022   TRIG 140.0 03/10/2022   HDL 67.90 03/10/2022   LDLCALC 148 (H) 03/10/2022   ALT 28 12/15/2022   AST 23 12/15/2022   NA 139 12/15/2022   K 4.3 12/15/2022   CL 105 12/15/2022   CREATININE 0.67 12/15/2022   BUN 10 12/15/2022   CO2 27 12/15/2022   TSH 0.99 12/15/2022   HGBA1C 6.2 12/15/2022    MM DIAG BREAST TOMO BILATERAL  Result Date: 10/23/2020 CLINICAL DATA:  Diffuse intermittent lateral right breast pain. EXAM: DIGITAL DIAGNOSTIC BILATERAL MAMMOGRAM WITH TOMO AND CAD TECHNIQUE: Bilateral digital diagnostic mammography and breast tomosynthesis was performed. Digital images of the breasts were evaluated with computer-aided detection. COMPARISON:  Previous exam(s). ACR Breast Density Category b: There are scattered areas of fibroglandular density. FINDINGS: No suspicious masses, calcifications, or distortion are identified in either breast. IMPRESSION: No mammographic evidence of malignancy. RECOMMENDATION: Annual screening mammography. I have discussed the findings and recommendations with the patient. If applicable, a reminder letter will be sent to the patient regarding the next appointment. BI-RADS CATEGORY  1: Negative. Electronically Signed   By: Gerome Sam III M.D   On: 10/23/2020 10:49    Assessment & Plan:   Problem List Items Addressed This Visit     Essential hypertension - Primary    Re-start amlodipine, Bystolic, as needed clonidine if high BP       Relevant Medications   amLODipine (NORVASC) 2.5 MG tablet   Other Relevant Orders   Comprehensive metabolic panel   Hemoglobin A1c   GERD    Chronic Cont on Protonix      Trigeminal neuralgia    L side: around the L orbit Naproxen prn        Vitamin D deficiency    On Vit D      Type 2 diabetes mellitus with obesity (HCC)    Check A1c  Relevant Orders   Hemoglobin A1c      Meds ordered this encounter  Medications   naproxen (NAPROSYN) 500 MG tablet    Sig: Take 1 tablet (500 mg total) by mouth 2 (two) times daily as needed for moderate pain.    Dispense:  60 tablet    Refill:  0   amLODipine (NORVASC) 2.5 MG tablet    Sig: Take 1 tablet (2.5 mg total) by mouth 2 (two) times daily.    Dispense:  180 tablet    Refill:  3      Follow-up: Return in about 4 months (around 07/17/2023) for a follow-up visit.  Sonda Primes, MD

## 2023-03-17 NOTE — Patient Instructions (Signed)
Good Morning Denise Donaldson,   Our office received a referral from Dr. Jacinta Shoe, M.D. to schedule an appointment. If you will give our office a call at your convenience to discuss scheduling at [phone] option 1   Thank you, [facility]

## 2023-03-17 NOTE — Assessment & Plan Note (Signed)
Re-start amlodipine, Bystolic, as needed clonidine if high BP 

## 2023-03-17 NOTE — Addendum Note (Signed)
Addended by: Delsa Grana R on: 03/17/2023 02:06 PM   Modules accepted: Orders

## 2023-03-17 NOTE — Assessment & Plan Note (Signed)
L side: around the L orbit Naproxen prn

## 2023-05-13 ENCOUNTER — Other Ambulatory Visit: Payer: No Typology Code available for payment source

## 2023-05-13 DIAGNOSIS — E1169 Type 2 diabetes mellitus with other specified complication: Secondary | ICD-10-CM

## 2023-05-13 DIAGNOSIS — E669 Obesity, unspecified: Secondary | ICD-10-CM | POA: Diagnosis not present

## 2023-05-13 DIAGNOSIS — I10 Essential (primary) hypertension: Secondary | ICD-10-CM

## 2023-05-13 LAB — COMPREHENSIVE METABOLIC PANEL
ALT: 33 U/L (ref 0–35)
AST: 24 U/L (ref 0–37)
Albumin: 4 g/dL (ref 3.5–5.2)
Alkaline Phosphatase: 50 U/L (ref 39–117)
BUN: 15 mg/dL (ref 6–23)
CO2: 26 mEq/L (ref 19–32)
Calcium: 9.7 mg/dL (ref 8.4–10.5)
Chloride: 108 mEq/L (ref 96–112)
Creatinine, Ser: 0.74 mg/dL (ref 0.40–1.20)
GFR: 82.56 mL/min (ref >60.00)
Glucose, Bld: 140 mg/dL — ABNORMAL HIGH (ref 70–99)
Potassium: 4.2 mEq/L (ref 3.5–5.1)
Sodium: 139 mEq/L (ref 135–145)
Total Bilirubin: 0.5 mg/dL (ref 0.2–1.2)
Total Protein: 7.2 g/dL (ref 6.0–8.3)

## 2023-05-13 LAB — HEMOGLOBIN A1C: Hgb A1c MFr Bld: 6.4 % (ref 4.6–6.5)

## 2023-06-14 LAB — HM DIABETES EYE EXAM

## 2023-07-05 ENCOUNTER — Telehealth: Payer: Self-pay | Admitting: Internal Medicine

## 2023-07-05 MED ORDER — NEBIVOLOL HCL 10 MG PO TABS: 10.0000 mg | ORAL_TABLET | Freq: Every day | ORAL | 3 refills | Status: DC

## 2023-07-05 NOTE — Telephone Encounter (Signed)
Patient called back and said that insurance will cover the diastolic if it is prescribed for 10 mg one time a day

## 2023-07-05 NOTE — Telephone Encounter (Signed)
Insurance will not pay for the distolic - patient would like something else sent in.  Please advise.

## 2023-07-05 NOTE — Telephone Encounter (Signed)
Ok Thx

## 2023-07-13 ENCOUNTER — Ambulatory Visit (INDEPENDENT_AMBULATORY_CARE_PROVIDER_SITE_OTHER): Payer: No Typology Code available for payment source

## 2023-07-13 VITALS — Ht 62.0 in | Wt 184.0 lb

## 2023-07-13 DIAGNOSIS — Z0000 Encounter for general adult medical examination without abnormal findings: Secondary | ICD-10-CM | POA: Diagnosis not present

## 2023-07-13 NOTE — Patient Instructions (Addendum)
Denise Donaldson , Thank you for taking time to come for your Medicare Wellness Visit. I appreciate your ongoing commitment to your health goals. Please review the following plan we discussed and let me know if I can assist you in the future.   Referrals/Orders/Follow-Ups/Clinician Recommendations: Remember to call to schedule your colonoscopy at [facility] [phone]).  You are due for a Covid and Flu vaccine. Aim for 30 minutes of exercise or brisk walking, 6-8 glasses of water, and 5 servings of fruits and vegetables each day.    This is a list of the screening recommended for you and due dates:  Health Maintenance  Topic Date Due   Complete foot exam   Never done   Eye exam for diabetics  Never done   Yearly kidney health urinalysis for diabetes  Never done   Hepatitis C Screening  Never done   DEXA scan (bone density measurement)  Never done   Colon Cancer Screening  04/27/2022   Mammogram  10/23/2022   COVID-19 Vaccine (4 - 2023-24 season) 05/23/2023   Hemoglobin A1C  11/13/2023   Yearly kidney function blood test for diabetes  05/12/2024   Medicare Annual Wellness Visit  07/12/2024   DTaP/Tdap/Td vaccine (2 - Td or Tdap) 06/01/2026   Pneumonia Vaccine  Completed   HPV Vaccine  Aged Out   Flu Shot  Discontinued   Zoster (Shingles) Vaccine  Discontinued    Advanced directives: (Copy Requested) Please bring a copy of your health care power of attorney and living will to the office to be added to your chart at your convenience.  Next Medicare Annual Wellness Visit scheduled for next year: Yes

## 2023-07-13 NOTE — Progress Notes (Signed)
Subjective:   Denise Donaldson is a 67 y.o. female who presents for Medicare Annual (Subsequent) preventive examination.  Visit Complete: Virtual I connected with  Verley Monjaras on 07/13/23 by a audio enabled telemedicine application and verified that I am speaking with the correct person using two identifiers.  Patient Location: Home  Provider Location: Home Office  I discussed the limitations of evaluation and management by telemedicine. The patient expressed understanding and agreed to proceed.  Vital Signs: Because this visit was a virtual/telehealth visit, some criteria may be missing or patient reported. Any vitals not documented were not able to be obtained and vitals that have been documented are patient reported.   Cardiac Risk Factors include: advanced age (>77men, >68 women);hypertension;diabetes mellitus     Objective:    Today's Vitals   07/13/23 1340  Weight: 184 lb (83.5 kg)  Height: 5\' 2"  (1.575 m)   Body mass index is 33.65 kg/m.     07/13/2023    1:55 PM 07/15/2022    2:47 PM 06/25/2021    1:16 PM 06/24/2020    9:53 AM 03/06/2019    2:46 PM 04/27/2012    8:11 AM  Advanced Directives  Does Patient Have a Medical Advance Directive? No No No No No Patient does not have advance directive  Would patient like information on creating a medical advance directive?  No - Patient declined No - Patient declined No - Patient declined No - Patient declined     Current Medications (verified) Outpatient Encounter Medications as of 07/13/2023  Medication Sig   amLODipine (NORVASC) 2.5 MG tablet Take 1 tablet (2.5 mg total) by mouth 2 (two) times daily.   b complex vitamins tablet Take 1 tablet by mouth daily.   Cholecalciferol (VITAMIN D3) 50 MCG (2000 UT) CAPS Take 1 capsule (2,000 Units total) by mouth daily.   cloNIDine (CATAPRES) 0.1 MG tablet Take 1 tablet (0.1 mg total) by mouth 2 (two) times daily as needed (for blood pressure over 180).   metFORMIN  (GLUCOPHAGE) 500 MG tablet Take 1 tablet (500 mg total) by mouth daily with breakfast.   naproxen (NAPROSYN) 500 MG tablet Take 1 tablet (500 mg total) by mouth 2 (two) times daily as needed for moderate pain.   pantoprazole (PROTONIX) 40 MG tablet Take 1 tablet (40 mg total) by mouth daily. PRN   nebivolol (BYSTOLIC) 10 MG tablet Take 1 tablet (10 mg total) by mouth daily. (Patient not taking: Reported on 07/13/2023)   No facility-administered encounter medications on file as of 07/13/2023.    Allergies (verified) Penicillins, Losartan, Metoprolol tartrate, Coreg [carvedilol], Lisinopril, Penicillin g, and Semaglutide(0.25 or 0.5mg -dos)   History: Past Medical History:  Diagnosis Date   Chest pain    Costochondral chest pain 2005   Left   Diverticulosis    Foot fracture, right    Dr Otelia Sergeant   Foot swelling    GERD (gastroesophageal reflux disease)    Dr Juanda Chance, EGD 2011 WNL   Head ache    Helicobacter pylori gastritis    History of pneumonia    HTN (hypertension)    Hyperplastic colon polyp    Menopausal disorder    After 52 Dr Rana Snare   Mini stroke    Numbness of lower limb    Numbness of upper limb    Obesity    Other fatigue    Pectus excavatum    Pre-diabetes    Spine pain    Venous insufficiency    Bilat LE  Vitamin D deficiency    Past Surgical History:  Procedure Laterality Date   APPENDECTOMY     BREAST EXCISIONAL BIOPSY     left breast excisional biopsy 1980   CARDIAC CATHETERIZATION  2006   had double vision past cath   CESAREAN SECTION     1 time   COLONOSCOPY  04/27/2012   Procedure: COLONOSCOPY;  Surgeon: Hart Carwin, MD;  Location: [facility];  Service: Endoscopy;  Laterality: N/A;   double vision  2006   past cardiac cath/ lasted 9 months and went away   ESOPHAGOGASTRODUODENOSCOPY  04/27/2012   Procedure: ESOPHAGOGASTRODUODENOSCOPY (EGD);  Surgeon: Hart Carwin, MD;  Location: [facility];  Service: Endoscopy;  Laterality: N/A;   OVARIAN CYST  REMOVAL     Family History  Problem Relation Age of Onset   Prostate cancer Father    Heart disease Father    Hypertension Father    Heart failure Father    Hypertension Sister    Prostate cancer Paternal Grandfather 32   Colon cancer Paternal Uncle    Stomach cancer Paternal Uncle 83   Diabetes Mother    Diabetes Maternal Aunt    Heart disease Maternal Grandmother    Stroke Paternal Grandmother    Esophageal cancer Neg Hx    Liver cancer Neg Hx    Rectal cancer Neg Hx    Social History   Socioeconomic History   Marital status: Married    Spouse name: Alecia Lemming   Number of children: 3   Years of education: Not on file   Highest education level: Not on file  Occupational History   Occupation: retired  Tobacco Use   Smoking status: Never   Smokeless tobacco: Never  Vaping Use   Vaping status: Never Used  Substance and Sexual Activity   Alcohol use: No   Drug use: No   Sexual activity: Yes    Birth control/protection: Post-menopausal  Other Topics Concern   Not on file  Social History Narrative   Daily Caffeine - 2/dayRegular Exercise -  NO   Lives with husband and has 6 dogs and 1 cat.   Social Determinants of Health   Financial Resource Strain: Low Risk  (07/13/2023)   Overall Financial Resource Strain (CARDIA)    Difficulty of Paying Living Expenses: Not very hard  Food Insecurity: No Food Insecurity (07/13/2023)   Hunger Vital Sign    Worried About Running Out of Food in the Last Year: Never true    Ran Out of Food in the Last Year: Never true  Transportation Needs: No Transportation Needs (07/13/2023)   PRAPARE - Administrator, Civil Service (Medical): No    Lack of Transportation (Non-Medical): No  Physical Activity: Inactive (07/13/2023)   Exercise Vital Sign    Days of Exercise per Week: 0 days    Minutes of Exercise per Session: 0 min  Stress: No Stress Concern Present (07/13/2023)   [employer] of Occupational Health - Occupational  Stress Questionnaire    Feeling of Stress : Not at all  Social Connections: Moderately Integrated (07/13/2023)   Social Connection and Isolation Panel [NHANES]    Frequency of Communication with Friends and Family: More than three times a week    Frequency of Social Gatherings with Friends and Family: Once a week    Attends Religious Services: More than 4 times per year    Active Member of [employer] or Organizations: No    Attends Banker Meetings:  Never    Marital Status: Married    Tobacco Counseling Counseling given: Not Answered   Clinical Intake:  Pre-visit preparation completed: Yes  Pain : No/denies pain     BMI - recorded: 33.65 Nutritional Status: BMI > 30  Obese Nutritional Risks: None Diabetes: Yes CBG done?: No Did pt. bring in CBG monitor from home?: No  How often do you need to have someone help you when you read instructions, pamphlets, or other written materials from your doctor or pharmacy?: 2 - Rarely  Interpreter Needed?: No  Information entered by :: Carleton Vanvalkenburgh, RMA   Activities of Daily Living    07/13/2023    1:41 PM 07/15/2022    2:09 PM  In your present state of health, do you have any difficulty performing the following activities:  Hearing? 0 0  Vision? 0 0  Difficulty concentrating or making decisions? 0 0  Walking or climbing stairs? 0 0  Dressing or bathing? 0 0  Doing errands, shopping? 0 0  Preparing Food and eating ? N N  Using the Toilet? N N  In the past six months, have you accidently leaked urine? N N  Do you have problems with loss of bowel control? N N  Managing your Medications? N N  Managing your Finances? N N  Housekeeping or managing your Housekeeping? N N    Patient Care Team: Plotnikov, Georgina Quint, MD as PCP - General Tresa Endo Clovis Pu, MD (Cardiology) Napoleon Form, MD as Consulting Physician (Gastroenterology) Maris Berger, MD as Consulting Physician (Ophthalmology) Candice Camp, MD as  Consulting Physician (Obstetrics and Gynecology)  Indicate any recent Medical Services you may have received from other than [facility] providers in the past year (date may be approximate).     Assessment:   This is a routine wellness examination for Jerriyah.  Hearing/Vision screen Hearing Screening - Comments:: Denies hearing difficulties   Vision Screening - Comments:: Wears eyeglasses   Goals Addressed               This Visit's Progress     Patient Stated (pt-stated)        Would like to get Bp under control      Depression Screen    07/13/2023    1:59 PM 03/17/2023    1:20 PM 12/15/2022    1:20 PM 09/18/2022    1:14 PM 09/18/2022    1:13 PM 07/15/2022    1:59 PM 06/25/2021    2:56 PM  PHQ 2/9 Scores  PHQ - 2 Score 0 0 0 0 0 0 0  PHQ- 9 Score 0   0  0     Fall Risk    07/13/2023    1:56 PM 03/17/2023    1:20 PM 12/15/2022    1:20 PM 09/18/2022    1:13 PM 07/15/2022    1:59 PM  Fall Risk   Falls in the past year? 0 0 0 0 0  Number falls in past yr: 0 0 0 0 0  Injury with Fall? 0 0 0 0 0  Risk for fall due to : No Fall Risks No Fall Risks No Fall Risks No Fall Risks No Fall Risks  Follow up Falls prevention discussed;Falls evaluation completed Falls evaluation completed Falls evaluation completed Falls evaluation completed     MEDICARE RISK AT HOME: Medicare Risk at Home Any stairs in or around the home?: Yes If so, are there any without handrails?: Yes Home free of loose throw rugs  in walkways, pet beds, electrical cords, etc?: Yes Adequate lighting in your home to reduce risk of falls?: Yes Life alert?: No Use of a cane, walker or w/c?: No Grab bars in the bathroom?: No Shower chair or bench in shower?: No Elevated toilet seat or a handicapped toilet?: No  TIMED UP AND GO:  Was the test performed?  No    Cognitive Function:        07/13/2023    1:56 PM 07/15/2022    2:10 PM  6CIT Screen  What Year? 0 points 0 points  What month? 0 points 0  points  What time? 0 points 0 points  Count back from 20 0 points 0 points  Months in reverse 0 points 0 points  Repeat phrase 0 points 0 points  Total Score 0 points 0 points    Immunizations Immunization History  Administered Date(s) Administered   PFIZER(Purple Top)SARS-COV-2 Vaccination 12/01/2019, 12/25/2019, 08/04/2020   PNEUMOCOCCAL CONJUGATE-20 03/17/2023   Tdap 06/01/2016    TDAP status: Up to date  Flu Vaccine status: Declined, Education has been provided regarding the importance of this vaccine but patient still declined. Advised may receive this vaccine at local pharmacy or Health Dept. Aware to provide a copy of the vaccination record if obtained from local pharmacy or Health Dept. Verbalized acceptance and understanding.  Pneumococcal vaccine status: Up to date  Covid-19 vaccine status: Information provided on how to obtain vaccines.   Qualifies for Shingles Vaccine? Yes   Zostavax completed No   Shingrix Completed?: No.    Education has been provided regarding the importance of this vaccine. Patient has been advised to call insurance company to determine out of pocket expense if they have not yet received this vaccine. Advised may also receive vaccine at local pharmacy or Health Dept. Verbalized acceptance and understanding.  Screening Tests Health Maintenance  Topic Date Due   FOOT EXAM  Never done   OPHTHALMOLOGY EXAM  Never done   Diabetic kidney evaluation - Urine ACR  Never done   Hepatitis C Screening  Never done   DEXA SCAN  Never done   Colonoscopy  04/27/2022   MAMMOGRAM  10/23/2022   COVID-19 Vaccine (4 - 2023-24 season) 05/23/2023   HEMOGLOBIN A1C  11/13/2023   Diabetic kidney evaluation - eGFR measurement  05/12/2024   Medicare Annual Wellness (AWV)  07/12/2024   DTaP/Tdap/Td (2 - Td or Tdap) 06/01/2026   Pneumonia Vaccine 39+ Years old  Completed   HPV VACCINES  Aged Out   INFLUENZA VACCINE  Discontinued   Zoster Vaccines- Shingrix   Discontinued    Health Maintenance  Health Maintenance Due  Topic Date Due   FOOT EXAM  Never done   OPHTHALMOLOGY EXAM  Never done   Diabetic kidney evaluation - Urine ACR  Never done   Hepatitis C Screening  Never done   DEXA SCAN  Never done   Colonoscopy  04/27/2022   MAMMOGRAM  10/23/2022   COVID-19 Vaccine (4 - 2023-24 season) 05/23/2023    Colorectal cancer screening: Referral to GI placed 12/15/2022. Pt aware the office will call re: appt.   Lung Cancer Screening: (Low Dose CT Chest recommended if Age 36-80 years, 20 pack-year currently smoking OR have quit w/in 15years.) does not qualify.   Lung Cancer Screening Referral: N/A  Additional Screening:  Hepatitis C Screening: does qualify;   Vision Screening: Recommended annual ophthalmology exams for early detection of glaucoma and other disorders of the eye. Is the patient  up to date with their annual eye exam?  No  Who is the provider or what is the name of the office in which the patient attends annual eye exams? Dr. Charlotte Sanes If pt is not established with a provider, would they like to be referred to a provider to establish care? No .   Dental Screening: Recommended annual dental exams for proper oral hygiene  Diabetic Foot Exam: Diabetic Foot Exam: Overdue, Pt has been advised about the importance in completing this exam. Pt is scheduled for diabetic foot exam on 07/14/2023.  Community Resource Referral / Chronic Care Management: CRR required this visit?  No   CCM required this visit?  No     Plan:     I have personally reviewed and noted the following in the patient's chart:   Medical and social history Use of alcohol, tobacco or illicit drugs  Current medications and supplements including opioid prescriptions. Patient is not currently taking opioid prescriptions. Functional ability and status Nutritional status Physical activity Advanced directives List of other physicians Hospitalizations,  surgeries, and ER visits in previous 12 months Vitals Screenings to include cognitive, depression, and falls Referrals and appointments  In addition, I have reviewed and discussed with patient certain preventive protocols, quality metrics, and best practice recommendations. A written personalized care plan for preventive services as well as general preventive health recommendations were provided to patient.     Meerab Maselli L Tavia Stave, CMA   07/13/2023   After Visit Summary: (MyChart) Due to this being a telephonic visit, the after visit summary with patients personalized plan was offered to patient via MyChart   Nurse Notes: Patient is due for Flu and Covid vaccines.  She is due for a DEXA, and mammogram, which her OBGYN will place order for screenings.  Her last mammogram was in 2022 and she never had a DEXA.  Patient is due for an eye exam and a foot exam and a Hep C screening.  Patient has an appointment tomorrow with Dr. Posey Rea and would like to discuss her Bp medication.  Patient stated that she has been out of it for around 20 days, (Bystolic 10 mg) due to cost.

## 2023-07-14 ENCOUNTER — Encounter: Payer: Self-pay | Admitting: Internal Medicine

## 2023-07-14 ENCOUNTER — Ambulatory Visit: Payer: No Typology Code available for payment source | Admitting: Internal Medicine

## 2023-07-14 VITALS — BP 130/78 | HR 73 | Temp 98.7°F | Ht 62.0 in | Wt 180.0 lb

## 2023-07-14 DIAGNOSIS — Z7984 Long term (current) use of oral hypoglycemic drugs: Secondary | ICD-10-CM | POA: Diagnosis not present

## 2023-07-14 DIAGNOSIS — R739 Hyperglycemia, unspecified: Secondary | ICD-10-CM

## 2023-07-14 DIAGNOSIS — K2100 Gastro-esophageal reflux disease with esophagitis, without bleeding: Secondary | ICD-10-CM

## 2023-07-14 DIAGNOSIS — E669 Obesity, unspecified: Secondary | ICD-10-CM

## 2023-07-14 DIAGNOSIS — R609 Edema, unspecified: Secondary | ICD-10-CM | POA: Diagnosis not present

## 2023-07-14 DIAGNOSIS — E1169 Type 2 diabetes mellitus with other specified complication: Secondary | ICD-10-CM

## 2023-07-14 DIAGNOSIS — E559 Vitamin D deficiency, unspecified: Secondary | ICD-10-CM

## 2023-07-14 DIAGNOSIS — Z6832 Body mass index (BMI) 32.0-32.9, adult: Secondary | ICD-10-CM

## 2023-07-14 DIAGNOSIS — Z789 Other specified health status: Secondary | ICD-10-CM

## 2023-07-14 DIAGNOSIS — I10 Essential (primary) hypertension: Secondary | ICD-10-CM | POA: Diagnosis not present

## 2023-07-14 MED ORDER — BISOPROLOL FUMARATE 5 MG PO TABS: 5.0000 mg | ORAL_TABLET | Freq: Every day | ORAL | 3 refills | Status: DC

## 2023-07-14 NOTE — Assessment & Plan Note (Signed)
Stopped eating meat in 2018; eating fish, eggs, milk products

## 2023-07-14 NOTE — Assessment & Plan Note (Signed)
Bystolic - too $$$ Start Zebeta 5 mg/d

## 2023-07-14 NOTE — Assessment & Plan Note (Signed)
On Metformin - not every day Check A1c

## 2023-07-14 NOTE — Assessment & Plan Note (Signed)
On Vit D 

## 2023-07-14 NOTE — Assessment & Plan Note (Signed)
Cont on Metformin Mounjaro options discussed

## 2023-07-14 NOTE — Progress Notes (Signed)
Subjective:  Patient ID: Denise Donaldson, female    DOB: 01-28-1956  Age: 67 y.o. MRN: [id number]  CC: Follow-up (4 [facility] F/U, Discuss BP meds. Headache x2 weeks.)   HPI Denise Donaldson presents for HTN, GERD, DM  Bystolic  too $$$  Outpatient Medications Prior to Visit  Medication Sig Dispense Refill   amLODipine (NORVASC) 2.5 MG tablet Take 1 tablet (2.5 mg total) by mouth 2 (two) times daily. 180 tablet 3   b complex vitamins tablet Take 1 tablet by mouth daily. 100 tablet 3   Cholecalciferol (VITAMIN D3) 50 MCG (2000 UT) CAPS Take 1 capsule (2,000 Units total) by mouth daily. 100 capsule 3   cloNIDine (CATAPRES) 0.1 MG tablet Take 1 tablet (0.1 mg total) by mouth 2 (two) times daily as needed (for blood pressure over 180). 60 tablet 1   metFORMIN (GLUCOPHAGE) 500 MG tablet Take 1 tablet (500 mg total) by mouth daily with breakfast. 90 tablet 3   naproxen (NAPROSYN) 500 MG tablet Take 1 tablet (500 mg total) by mouth 2 (two) times daily as needed for moderate pain. 60 tablet 0   pantoprazole (PROTONIX) 40 MG tablet Take 1 tablet (40 mg total) by mouth daily. PRN 90 tablet 3   nebivolol (BYSTOLIC) 10 MG tablet Take 1 tablet (10 mg total) by mouth daily. (Patient not taking: Reported on 07/13/2023) 90 tablet 3   No facility-administered medications prior to visit.    ROS: Review of Systems  Constitutional:  Negative for activity change, appetite change, chills, fatigue and unexpected weight change.  HENT:  Negative for congestion, mouth sores and sinus pressure.   Eyes:  Negative for visual disturbance.  Respiratory:  Negative for cough and chest tightness.   Gastrointestinal:  Negative for abdominal pain and nausea.  Genitourinary:  Negative for difficulty urinating, frequency and vaginal pain.  Musculoskeletal:  Negative for back pain and gait problem.  Skin:  Negative for pallor and rash.  Neurological:  Negative for dizziness, tremors, weakness, numbness and headaches.   Psychiatric/Behavioral:  Negative for confusion and sleep disturbance.     Objective:  BP 130/78 (BP Location: Left Arm, Patient Position: Sitting, Cuff Size: Normal)   Pulse 73   Temp 98.7 F (37.1 C) (Oral)   Ht 5\' 2"  (1.575 m)   Wt 180 lb (81.6 kg)   SpO2 94%   BMI 32.92 kg/m   BP Readings from Last 3 Encounters:  07/14/23 130/78  03/17/23 122/80  12/15/22 110/82    Wt Readings from Last 3 Encounters:  07/14/23 180 lb (81.6 kg)  07/13/23 184 lb (83.5 kg)  03/17/23 181 lb (82.1 kg)    Physical Exam Constitutional:      General: She is not in acute distress.    Appearance: She is well-developed. She is obese.  HENT:     Head: Normocephalic.     Right Ear: External ear normal.     Left Ear: External ear normal.     Nose: Nose normal.  Eyes:     General:        Right eye: No discharge.        Left eye: No discharge.     Conjunctiva/sclera: Conjunctivae normal.     Pupils: Pupils are equal, round, and reactive to light.  Neck:     Thyroid: No thyromegaly.     Vascular: No JVD.     Trachea: No tracheal deviation.  Cardiovascular:     Rate and Rhythm: Normal rate and regular  rhythm.     Heart sounds: Normal heart sounds.  Pulmonary:     Effort: No respiratory distress.     Breath sounds: No stridor. No wheezing.  Abdominal:     General: Bowel sounds are normal. There is no distension.     Palpations: Abdomen is soft. There is no mass.     Tenderness: There is no abdominal tenderness. There is no guarding or rebound.  Musculoskeletal:        General: No tenderness.     Cervical back: Normal range of motion and neck supple. No rigidity.  Lymphadenopathy:     Cervical: No cervical adenopathy.  Skin:    Findings: No erythema or rash.  Neurological:     Cranial Nerves: No cranial nerve deficit.     Motor: No abnormal muscle tone.     Coordination: Coordination normal.     Deep Tendon Reflexes: Reflexes normal.  Psychiatric:        Behavior: Behavior  normal.        Thought Content: Thought content normal.        Judgment: Judgment normal.     Lab Results  Component Value Date   WBC 6.5 12/15/2022   HGB 14.8 12/15/2022   HCT 43.9 12/15/2022   PLT 384.0 12/15/2022   GLUCOSE 140 (H) 05/13/2023   CHOL 244 (H) 03/10/2022   TRIG 140.0 03/10/2022   HDL 67.90 03/10/2022   LDLCALC 148 (H) 03/10/2022   ALT 33 05/13/2023   AST 24 05/13/2023   NA 139 05/13/2023   K 4.2 05/13/2023   CL 108 05/13/2023   CREATININE 0.74 05/13/2023   BUN 15 05/13/2023   CO2 26 05/13/2023   TSH 0.99 12/15/2022   HGBA1C 6.4 05/13/2023    MM DIAG BREAST TOMO BILATERAL  Result Date: 10/23/2020 CLINICAL DATA:  Diffuse intermittent lateral right breast pain. EXAM: DIGITAL DIAGNOSTIC BILATERAL MAMMOGRAM WITH TOMO AND CAD TECHNIQUE: Bilateral digital diagnostic mammography and breast tomosynthesis was performed. Digital images of the breasts were evaluated with computer-aided detection. COMPARISON:  Previous exam(s). ACR Breast Density Category b: There are scattered areas of fibroglandular density. FINDINGS: No suspicious masses, calcifications, or distortion are identified in either breast. IMPRESSION: No mammographic evidence of malignancy. RECOMMENDATION: Annual screening mammography. I have discussed the findings and recommendations with the patient. If applicable, a reminder letter will be sent to the patient regarding the next appointment. BI-RADS CATEGORY  1: Negative. Electronically Signed   By: Gerome Sam III M.D   On: 10/23/2020 10:49    Assessment & Plan:   Problem List Items Addressed This Visit     Essential hypertension    Bystolic - too $$$ Start Zebeta 5 mg/d      Relevant Medications   bisoprolol (ZEBETA) 5 MG tablet   GERD (gastroesophageal reflux disease)    Chronic Cont on Protonix      Edema - Primary    Bystolic - too $$$ Start Zebeta 5 mg/d      Relevant Orders   Comprehensive metabolic panel   Hemoglobin A1c   TSH    Vitamin D deficiency    On Vit D      Vegetarian diet     Stopped eating meat in 2018; eating fish, eggs, milk products      Hyperglycemia    Cont on Metformin Mounjaro options discussed      Relevant Orders   Comprehensive metabolic panel   Hemoglobin A1c   TSH   Type 2  diabetes mellitus with obesity (HCC)    On Metformin - not every day Check A1c      Relevant Orders   Comprehensive metabolic panel   Hemoglobin A1c   TSH      Meds ordered this encounter  Medications   bisoprolol (ZEBETA) 5 MG tablet    Sig: Take 1 tablet (5 mg total) by mouth daily.    Dispense:  90 tablet    Refill:  3      Follow-up: Return in about 3 months (around 10/14/2023) for a follow-up visit.  Sonda Primes, MD

## 2023-07-14 NOTE — Assessment & Plan Note (Signed)
?  Chronic  ?Cont on Protonix ?

## 2023-07-29 ENCOUNTER — Other Ambulatory Visit: Payer: Self-pay | Admitting: Internal Medicine

## 2023-07-29 ENCOUNTER — Other Ambulatory Visit (INDEPENDENT_AMBULATORY_CARE_PROVIDER_SITE_OTHER): Payer: No Typology Code available for payment source

## 2023-07-29 DIAGNOSIS — E1169 Type 2 diabetes mellitus with other specified complication: Secondary | ICD-10-CM

## 2023-07-29 DIAGNOSIS — Z01818 Encounter for other preprocedural examination: Secondary | ICD-10-CM

## 2023-07-29 DIAGNOSIS — E669 Obesity, unspecified: Secondary | ICD-10-CM

## 2023-07-29 DIAGNOSIS — R739 Hyperglycemia, unspecified: Secondary | ICD-10-CM

## 2023-07-29 DIAGNOSIS — R609 Edema, unspecified: Secondary | ICD-10-CM | POA: Diagnosis not present

## 2023-07-29 LAB — COMPREHENSIVE METABOLIC PANEL
ALT: 37 U/L — ABNORMAL HIGH (ref 0–35)
AST: 28 U/L (ref 0–37)
Albumin: 4.3 g/dL (ref 3.5–5.2)
Alkaline Phosphatase: 56 U/L (ref 39–117)
BUN: 11 mg/dL (ref 6–23)
CO2: 21 meq/L (ref 19–32)
Calcium: 9.9 mg/dL (ref 8.4–10.5)
Chloride: 106 meq/L (ref 96–112)
Creatinine, Ser: 0.69 mg/dL (ref 0.40–1.20)
GFR: 88.43 mL/min (ref >60.00)
Glucose, Bld: 111 mg/dL — ABNORMAL HIGH (ref 70–99)
Potassium: 4.1 meq/L (ref 3.5–5.1)
Sodium: 137 meq/L (ref 135–145)
Total Bilirubin: 0.6 mg/dL (ref 0.2–1.2)
Total Protein: 7.4 g/dL (ref 6.0–8.3)

## 2023-07-29 LAB — TSH: TSH: 1.28 u[IU]/mL (ref 0.35–5.50)

## 2023-07-29 LAB — HEMOGLOBIN A1C: Hgb A1c MFr Bld: 6.4 % (ref 4.6–6.5)

## 2023-07-30 LAB — ABO/RH: Rh Factor: POSITIVE

## 2023-10-14 ENCOUNTER — Encounter: Payer: Self-pay | Admitting: Internal Medicine

## 2023-10-14 ENCOUNTER — Ambulatory Visit: Payer: Medicare HMO | Admitting: Internal Medicine

## 2023-10-14 VITALS — BP 110/70 | HR 66 | Temp 98.2°F | Ht 62.0 in | Wt 180.0 lb

## 2023-10-14 DIAGNOSIS — E669 Obesity, unspecified: Secondary | ICD-10-CM

## 2023-10-14 DIAGNOSIS — Z6832 Body mass index (BMI) 32.0-32.9, adult: Secondary | ICD-10-CM

## 2023-10-14 DIAGNOSIS — I10 Essential (primary) hypertension: Secondary | ICD-10-CM

## 2023-10-14 DIAGNOSIS — K2270 Barrett's esophagus without dysplasia: Secondary | ICD-10-CM

## 2023-10-14 DIAGNOSIS — R519 Headache, unspecified: Secondary | ICD-10-CM

## 2023-10-14 DIAGNOSIS — E1169 Type 2 diabetes mellitus with other specified complication: Secondary | ICD-10-CM | POA: Diagnosis not present

## 2023-10-14 DIAGNOSIS — K2100 Gastro-esophageal reflux disease with esophagitis, without bleeding: Secondary | ICD-10-CM

## 2023-10-14 DIAGNOSIS — E559 Vitamin D deficiency, unspecified: Secondary | ICD-10-CM | POA: Diagnosis not present

## 2023-10-14 DIAGNOSIS — Z7984 Long term (current) use of oral hypoglycemic drugs: Secondary | ICD-10-CM

## 2023-10-14 LAB — COMPREHENSIVE METABOLIC PANEL
ALT: 42 U/L — ABNORMAL HIGH (ref 0–35)
AST: 30 U/L (ref 0–37)
Albumin: 4.5 g/dL (ref 3.5–5.2)
Alkaline Phosphatase: 54 U/L (ref 39–117)
BUN: 13 mg/dL (ref 6–23)
CO2: 27 meq/L (ref 19–32)
Calcium: 10 mg/dL (ref 8.4–10.5)
Chloride: 105 meq/L (ref 96–112)
Creatinine, Ser: 0.69 mg/dL (ref 0.40–1.20)
GFR: 88.3 mL/min (ref >60.00)
Glucose, Bld: 79 mg/dL (ref 70–99)
Potassium: 5 meq/L (ref 3.5–5.1)
Sodium: 139 meq/L (ref 135–145)
Total Bilirubin: 0.4 mg/dL (ref 0.2–1.2)
Total Protein: 7.8 g/dL (ref 6.0–8.3)

## 2023-10-14 LAB — HEMOGLOBIN A1C: Hgb A1c MFr Bld: 6.6 % — ABNORMAL HIGH (ref 4.6–6.5)

## 2023-10-14 MED ORDER — NEBIVOLOL HCL 5 MG PO TABS: 5.0000 mg | ORAL_TABLET | Freq: Two times a day (BID) | ORAL | 3 refills | Status: DC

## 2023-10-14 NOTE — Assessment & Plan Note (Signed)
Advil or Aleve prn Migraines due to weather changes

## 2023-10-14 NOTE — Assessment & Plan Note (Signed)
?  Chronic  ?Cont on Protonix ?

## 2023-10-14 NOTE — Assessment & Plan Note (Signed)
On Vit D 

## 2023-10-14 NOTE — Assessment & Plan Note (Signed)
F/u w/Dr Silverio Decamp On Protonix  Potential benefits of a long term PPI use as well as potential risks  and complications were explained to the patient and were aknowledged.

## 2023-10-14 NOTE — Progress Notes (Addendum)
Subjective:  Patient ID: Denise Donaldson, female    DOB: 1956-02-26  Age: 68 y.o. MRN: [id number]  CC: Medical Management of Chronic Issues (3 [facility] F/U)   HPI [name] presents for HTN, DM, GERD C/o wt loss [place] since 2020  Outpatient Medications Prior to Visit  Medication Sig Dispense Refill   amLODipine (NORVASC) 2.5 MG tablet Take 1 tablet (2.5 mg total) by mouth 2 (two) times daily. 180 tablet 3   b complex vitamins tablet Take 1 tablet by mouth daily. 100 tablet 3   Cholecalciferol (VITAMIN D3) 50 MCG (2000 UT) CAPS Take 1 capsule (2,000 Units total) by mouth daily. 100 capsule 3   cloNIDine (CATAPRES) 0.1 MG tablet Take 1 tablet (0.1 mg total) by mouth 2 (two) times daily as needed (for blood pressure over 180). 60 tablet 1   metFORMIN (GLUCOPHAGE) 500 MG tablet Take 1 tablet (500 mg total) by mouth daily with breakfast. 90 tablet 3   naproxen (NAPROSYN) 500 MG tablet Take 1 tablet (500 mg total) by mouth 2 (two) times daily as needed for moderate pain. 60 tablet 0   pantoprazole (PROTONIX) 40 MG tablet Take 1 tablet (40 mg total) by mouth daily. PRN 90 tablet 3   bisoprolol (ZEBETA) 5 MG tablet Take 1 tablet (5 mg total) by mouth daily. 90 tablet 3   nebivolol (BYSTOLIC) 10 MG tablet Take 1 tablet (10 mg total) by mouth daily. (Patient not taking: Reported on 10/14/2023) 90 tablet 3   No facility-administered medications prior to visit.    ROS: Review of Systems  Constitutional:  Negative for activity change, appetite change, chills, fatigue and unexpected weight change.  HENT:  Negative for congestion, mouth sores and sinus pressure.   Eyes:  Negative for visual disturbance.  Respiratory:  Negative for cough and chest tightness.   Gastrointestinal:  Negative for abdominal pain and nausea.  Genitourinary:  Negative for difficulty urinating, frequency and vaginal pain.  Musculoskeletal:  Negative for back pain and gait problem.  Skin:  Negative for  pallor and rash.  Neurological:  Negative for dizziness, tremors, weakness, numbness and headaches.  Psychiatric/Behavioral:  Negative for confusion and sleep disturbance.     Objective:  BP 110/70 (BP Location: Right Arm, Patient Position: Sitting, Cuff Size: Normal)   Pulse 66   Temp 98.2 F (36.8 C) (Oral)   Ht 5\' 2"  (1.575 m)   Wt 180 lb (81.6 kg)   SpO2 98%   BMI 32.92 kg/m   BP Readings from Last 3 Encounters:  10/14/23 110/70  07/14/23 130/78  03/17/23 122/80    Wt Readings from Last 3 Encounters:  10/14/23 180 lb (81.6 kg)  07/14/23 180 lb (81.6 kg)  07/13/23 184 lb (83.5 kg)    Physical Exam Constitutional:      General: She is not in acute distress.    Appearance: She is well-developed.  HENT:     Head: Normocephalic.     Right Ear: External ear normal.     Left Ear: External ear normal.     Nose: Nose normal.  Eyes:     General:        Right eye: No discharge.        Left eye: No discharge.     Conjunctiva/sclera: Conjunctivae normal.     Pupils: Pupils are equal, round, and reactive to light.  Neck:     Thyroid: No thyromegaly.     Vascular: No JVD.     Trachea: No  tracheal deviation.  Cardiovascular:     Rate and Rhythm: Normal rate and regular rhythm.     Heart sounds: Normal heart sounds.  Pulmonary:     Effort: No respiratory distress.     Breath sounds: No stridor. No wheezing.  Abdominal:     General: Bowel sounds are normal. There is no distension.     Palpations: Abdomen is soft. There is no mass.     Tenderness: There is no abdominal tenderness. There is no guarding or rebound.  Musculoskeletal:        General: No tenderness.     Cervical back: Normal range of motion and neck supple. No rigidity.  Lymphadenopathy:     Cervical: No cervical adenopathy.  Skin:    Findings: No erythema or rash.  Neurological:     Cranial Nerves: No cranial nerve deficit.     Motor: No abnormal muscle tone.     Coordination: Coordination normal.      Deep Tendon Reflexes: Reflexes normal.  Psychiatric:        Behavior: Behavior normal.        Thought Content: Thought content normal.        Judgment: Judgment normal.     Lab Results  Component Value Date   WBC 6.5 12/15/2022   HGB 14.8 12/15/2022   HCT 43.9 12/15/2022   PLT 384.0 12/15/2022   GLUCOSE 111 (H) 07/29/2023   CHOL 244 (H) 03/10/2022   TRIG 140.0 03/10/2022   HDL 67.90 03/10/2022   LDLCALC 148 (H) 03/10/2022   ALT 37 (H) 07/29/2023   AST 28 07/29/2023   NA 137 07/29/2023   K 4.1 07/29/2023   CL 106 07/29/2023   CREATININE 0.69 07/29/2023   BUN 11 07/29/2023   CO2 21 07/29/2023   TSH 1.28 07/29/2023   HGBA1C 6.4 07/29/2023    MM DIAG BREAST TOMO BILATERAL Result Date: 10/23/2020 CLINICAL DATA:  Diffuse intermittent lateral right breast pain. EXAM: DIGITAL DIAGNOSTIC BILATERAL MAMMOGRAM WITH TOMO AND CAD TECHNIQUE: Bilateral digital diagnostic mammography and breast tomosynthesis was performed. Digital images of the breasts were evaluated with computer-aided detection. COMPARISON:  Previous exam(s). ACR Breast Density Category b: There are scattered areas of fibroglandular density. FINDINGS: No suspicious masses, calcifications, or distortion are identified in either breast. IMPRESSION: No mammographic evidence of malignancy. RECOMMENDATION: Annual screening mammography. I have discussed the findings and recommendations with the patient. If applicable, a reminder letter will be sent to the patient regarding the next appointment. BI-RADS CATEGORY  1: Negative. Electronically Signed   By: Gerome Sam III M.D   On: 10/23/2020 10:49    Assessment & Plan:   Problem List Items Addressed This Visit     Essential hypertension - Primary   Bystolic - too $$$ Start Zebeta 5 mg/d      Relevant Medications   nebivolol (BYSTOLIC) 5 MG tablet   Other Relevant Orders   Comprehensive metabolic panel   GERD (gastroesophageal reflux disease)   Chronic Cont on Protonix       Barrett's esophagus   F/u w/Dr Lavon Paganini On Protonix  Potential benefits of a long term PPI use as well as potential risks  and complications were explained to the patient and were aknowledged.      Vitamin D deficiency   On Vit D      Headache   Advil or Aleve prn Migraines due to weather changes      Relevant Medications   nebivolol (BYSTOLIC) 5 MG tablet  Type 2 diabetes mellitus with obesity (HCC)   On Metformin - not every day Check A1c Pt declined Rybelsus, Farxiga      Relevant Orders   Comprehensive metabolic panel   Hemoglobin A1c      Meds ordered this encounter  Medications   nebivolol (BYSTOLIC) 5 MG tablet    Sig: Take 1 tablet (5 mg total) by mouth 2 (two) times daily.    Dispense:  180 tablet    Refill:  3      Follow-up: Return in about 3 months (around 01/12/2024) for a follow-up visit.  Sonda Primes, MD

## 2023-10-14 NOTE — Assessment & Plan Note (Signed)
Bystolic - too $$$ Start Zebeta 5 mg/d

## 2023-10-14 NOTE — Assessment & Plan Note (Addendum)
On Metformin - not every day Check A1c Pt declined Rybelsus, Marcelline Deist

## 2023-10-14 NOTE — Addendum Note (Signed)
Addended by: Tresa Garter on: 10/14/2023 01:57 PM   Modules accepted: Orders

## 2023-10-15 ENCOUNTER — Other Ambulatory Visit (HOSPITAL_COMMUNITY): Payer: Self-pay

## 2023-10-15 ENCOUNTER — Telehealth: Payer: Self-pay | Admitting: Pharmacy Technician

## 2023-10-15 NOTE — Telephone Encounter (Signed)
Pharmacy Patient Advocate Encounter   Received notification from CoverMyMeds that prior authorization for NEBIVOLOL 5 MG is required/requested.   Insurance verification completed.   The patient is insured through [employer] .   Per test claim: The current 30 day co-pay is, $12.27.  No PA needed at this time. This test claim was processed through [facility]- copay amounts may vary at other pharmacies due to pharmacy/plan contracts, or as the patient moves through the different stages of their insurance plan.     **SPOKE TO [facility] AND THEY GOT A PAID CLAIM FOR A 30 DAY CO-PAY FOR $18.50**

## 2023-10-17 ENCOUNTER — Encounter: Payer: Self-pay | Admitting: Internal Medicine

## 2023-10-17 ENCOUNTER — Other Ambulatory Visit: Payer: Self-pay | Admitting: Internal Medicine

## 2023-10-17 DIAGNOSIS — R748 Abnormal levels of other serum enzymes: Secondary | ICD-10-CM

## 2023-10-21 ENCOUNTER — Ambulatory Visit
Admission: RE | Admit: 2023-10-21 | Discharge: 2023-10-21 | Disposition: A | Discharge status: Home / Self Care | Payer: Medicare HMO | Source: Ambulatory Visit | Attending: Internal Medicine | Admitting: Internal Medicine

## 2023-10-21 DIAGNOSIS — R748 Abnormal levels of other serum enzymes: Secondary | ICD-10-CM

## 2023-10-21 DIAGNOSIS — R7989 Other specified abnormal findings of blood chemistry: Secondary | ICD-10-CM | POA: Diagnosis not present

## 2023-10-24 ENCOUNTER — Encounter: Payer: Self-pay | Admitting: Internal Medicine

## 2023-10-29 ENCOUNTER — Ambulatory Visit: Payer: Medicare HMO | Admitting: Gastroenterology

## 2023-10-29 ENCOUNTER — Encounter: Payer: Self-pay | Admitting: Gastroenterology

## 2023-10-29 VITALS — BP 146/80 | HR 76 | Ht 62.0 in | Wt 186.0 lb

## 2023-10-29 DIAGNOSIS — Z860102 Personal history of hyperplastic colon polyps: Secondary | ICD-10-CM

## 2023-10-29 DIAGNOSIS — R7401 Elevation of levels of liver transaminase levels: Secondary | ICD-10-CM | POA: Diagnosis not present

## 2023-10-29 DIAGNOSIS — R6881 Early satiety: Secondary | ICD-10-CM | POA: Insufficient documentation

## 2023-10-29 DIAGNOSIS — K760 Fatty (change of) liver, not elsewhere classified: Secondary | ICD-10-CM | POA: Diagnosis not present

## 2023-10-29 DIAGNOSIS — R1011 Right upper quadrant pain: Secondary | ICD-10-CM | POA: Insufficient documentation

## 2023-10-29 DIAGNOSIS — Z1211 Encounter for screening for malignant neoplasm of colon: Secondary | ICD-10-CM | POA: Insufficient documentation

## 2023-10-29 MED ORDER — SUFLAVE 178.7 G PO SOLR: 1.0000 | Freq: Once | ORAL | 0 refills | Status: AC

## 2023-10-29 MED ORDER — SUFLAVE 178.7 G PO SOLR: 1.0000 | Freq: Once | ORAL | 0 refills | Status: DC

## 2023-10-29 NOTE — Progress Notes (Signed)
 10/29/2023 Denise Donaldson [id number] 1956-07-09   HISTORY OF PRESENT ILLNESS: This is a 68 year old female who is a patient of Dr. Trenna.  She is here today at request of Dr. Garald to have her surveillance colonoscopy.  She had colonoscopy in August 2013 at which time she had a single hyperplastic polyp removed.  Repeat was recommended in 10 years.  She had a slight elevation in her ALT at 37 recently.  Right upper quadrant abdominal ultrasound showed hepatic steatosis.  Gallbladder appeared normal.  She says for years, looks like dating back to at least 2017, she complains of fullness when eating.  Says she does not eat a lot but despite not eating much she cannot lose weight.  She has cut out all types of meat products for the most part except for eggs and maybe fish.  She says that they just do not settle well in her stomach and seem to sit there for a long time.  She is not diabetic.  She is not on any GLP-1s.  She actually had a gastric emptying scan back in 2017 it was normal.  She had an EGD in 2018 that was also normal.   Past Medical History:  Diagnosis Date   Barrett's esophagus    Chest pain    Costochondral chest pain 2005   Left   Diverticulosis    Foot fracture, right    Dr Lucilla   Foot swelling    GERD (gastroesophageal reflux disease)    Dr Obie, EGD 2011 WNL   Head ache    Helicobacter pylori gastritis    History of pneumonia    HTN (hypertension)    Hyperplastic colon polyp    Menopausal disorder    After 52 Dr Marget   Mini stroke    Numbness of lower limb    Numbness of upper limb    Obesity    Other fatigue    Pectus excavatum    Pre-diabetes    Spine pain    Venous insufficiency    Bilat LE   Vitamin D  deficiency    Past Surgical History:  Procedure Laterality Date   APPENDECTOMY     BREAST EXCISIONAL BIOPSY Left    left breast excisional biopsy 1980   CARDIAC CATHETERIZATION  2006   had double vision past cath   CESAREAN SECTION      1 time   COLONOSCOPY  04/27/2012   Procedure: COLONOSCOPY;  Surgeon: Princella CHRISTELLA Obie, MD;  Location: [facility];  Service: Endoscopy;  Laterality: N/A;   double vision  2006   past cardiac cath/ lasted 9 months and went away   ESOPHAGOGASTRODUODENOSCOPY  04/27/2012   Procedure: ESOPHAGOGASTRODUODENOSCOPY (EGD);  Surgeon: Princella CHRISTELLA Obie, MD;  Location: [facility];  Service: Endoscopy;  Laterality: N/A;   OVARIAN CYST REMOVAL     partial    reports that she has never smoked. She has never used smokeless tobacco. She reports that she does not drink alcohol and does not use drugs. family history includes Colon cancer in her paternal uncle; Diabetes in her maternal aunt and mother; Esophageal cancer in her paternal grandfather; Gallstones in her mother; Heart disease in her father and maternal grandmother; Heart failure in her father; Hypertension in her father and sister; Jaundice in her mother; Liver disease in her mother; Peptic Ulcer in her sister; Prostate cancer in her father; Prostate cancer (age of onset: 55) in her paternal grandfather; Stomach cancer (age of onset: 58) in  her paternal uncle; Stroke in her paternal grandmother. Allergies  Allergen Reactions   Penicillins Other (See Comments)    Shock   Losartan      Not feeling well   Metoprolol Tartrate     REACTION: syncope   Coreg  [Carvedilol ]     SOB   Lisinopril    Penicillin G    Semaglutide (0.25 Or 0.5mg -Dos)     nausea      Outpatient Encounter Medications as of 10/29/2023  Medication Sig   amLODipine  (NORVASC ) 2.5 MG tablet Take 1 tablet (2.5 mg total) by mouth 2 (two) times daily.   b complex vitamins tablet Take 1 tablet by mouth daily.   Cholecalciferol  (VITAMIN D3) 50 MCG (2000 UT) CAPS Take 1 capsule (2,000 Units total) by mouth daily.   metFORMIN  (GLUCOPHAGE ) 500 MG tablet Take 1 tablet (500 mg total) by mouth daily with breakfast. (Patient taking differently: Take 500 mg by mouth as needed.)   nebivolol   (BYSTOLIC ) 10 MG tablet Take 5 mg by mouth 2 (two) times daily.   pantoprazole  (PROTONIX ) 40 MG tablet Take 1 tablet (40 mg total) by mouth daily. PRN   [DISCONTINUED] nebivolol  (BYSTOLIC ) 5 MG tablet Take 1 tablet (5 mg total) by mouth 2 (two) times daily.   cloNIDine  (CATAPRES ) 0.1 MG tablet Take 1 tablet (0.1 mg total) by mouth 2 (two) times daily as needed (for blood pressure over 180). (Patient not taking: Reported on 10/29/2023)   [DISCONTINUED] naproxen  (NAPROSYN ) 500 MG tablet Take 1 tablet (500 mg total) by mouth 2 (two) times daily as needed for moderate pain.   No facility-administered encounter medications on file as of 10/29/2023.    REVIEW OF SYSTEMS  : All other systems reviewed and negative except where noted in the History of Present Illness.   PHYSICAL EXAM: BP (!) 146/80 (BP Location: Left Arm, Patient Position: Sitting, Cuff Size: Large)   Pulse 76   Ht 5' 2 (1.575 m) Comment: height measured without shoes  Wt 186 lb (84.4 kg)   BMI 34.02 kg/m  General: Well developed white female in no acute distress Head: Normocephalic and atraumatic Eyes:  Sclerae anicteric, conjunctiva pink. Ears: Normal auditory acuity Lungs: Clear throughout to auscultation; no W/R/R. Heart: Regular rate and rhythm; no M/R/G. Abdomen: Soft, non-distended.  BS present.  Mild right upper quadrant tenderness to palpation. Rectal: Will be done at the time of colonoscopy. Musculoskeletal: Symmetrical with no gross deformities  Skin: No lesions on visible extremities Neurological: Alert oriented x 4, grossly non-focal Psychological:  Alert and cooperative. Normal mood and affect  ASSESSMENT AND PLAN: *Colorectal cancer screening: Last colonoscopy was in August 2013 at which time she had 1 hyperplastic polyp removed.  Repeat recommended in 10 years.  Will schedule with Dr. Nandigam. *Right upper quadrant abdominal discomfort with early satiety and feeling of fullness: Right upper quadrant abdominal  ultrasound showed normal gallbladder appearance.  She has had similar symptoms dating back to 2017.  EGD in 2018 was normal.  Will plan for HIDA scan with CCK to rule out biliary dysfunction.  If that is negative then we will tentatively plan for another endoscopy with Dr. Nandigam at the time of her colonoscopy. *Fatty liver/hepatic steatosis: Seen on ultrasound.  Mildly elevated ALT at 37.  Fib 4 score is low at 0.83.  **The risks, benefits, and alternatives to EGD and colonoscopy were discussed with the patient and she consents to proceed.   CC:  Plotnikov, Aleksei V, MD

## 2023-10-29 NOTE — Patient Instructions (Signed)
 You have been scheduled for a HIDA scan at [facility] [place] [facility] (1st floor) on Friday 11/05/23. Please arrive 30 minutes prior to your scheduled appointment at  9 am. Make certain not to have anything to eat or drink at least 6 hours prior to your test. Should this appointment date or time not work well for you, please call radiology scheduling at [phone].  _____________________________________________________________________ hepatobiliary (HIDA) scan is an imaging procedure used to diagnose problems in the liver, gallbladder and bile ducts. In the HIDA scan, a radioactive chemical or tracer is injected into a vein in your arm. The tracer is handled by the liver like bile. Bile is a fluid produced and excreted by your liver that helps your digestive system break down fats in the foods you eat. Bile is stored in your gallbladder and the gallbladder releases the bile when you eat a meal. A special nuclear medicine scanner (gamma camera) tracks the flow of the tracer from your liver into your gallbladder and small intestine.  During your HIDA scan  You'll be asked to change into a hospital gown before your HIDA scan begins. Your health care team will position you on a table, usually on your back. The radioactive tracer is then injected into a vein in your arm.The tracer travels through your bloodstream to your liver, where it's taken up by the bile-producing cells. The radioactive tracer travels with the bile from your liver into your gallbladder and through your bile ducts to your small intestine.You may feel some pressure while the radioactive tracer is injected into your vein. As you lie on the table, a special gamma camera is positioned over your abdomen taking pictures of the tracer as it moves through your body. The gamma camera takes pictures continually for about an hour. You'll need to keep still during the HIDA scan. This can become uncomfortable, but you may find that you can lessen the discomfort  by taking deep breaths and thinking about other things. Tell your health care team if you're uncomfortable. The radiologist will watch on a computer the progress of the radioactive tracer through your body. The HIDA scan may be stopped when the radioactive tracer is seen in the gallbladder and enters your small intestine. This typically takes about an hour. In some cases extra imaging will be performed if original images aren't satisfactory, if morphine is given to help visualize the gallbladder or if the medication CCK is given to look at the contraction of the gallbladder. This test typically takes 2 hours to complete. _______________________________________________________________  Denise Donaldson have been scheduled for an endoscopy and colonoscopy. Please follow the written instructions given to you at your visit today.  If you use inhalers (even only as needed), please bring them with you on the day of your procedure.  DO NOT TAKE 7 DAYS PRIOR TO TEST- Trulicity (dulaglutide) Ozempic, Wegovy (semaglutide ) Mounjaro (tirzepatide) Bydureon Bcise (exanatide extended release)  DO NOT TAKE 1 DAY PRIOR TO YOUR TEST Rybelsus  (semaglutide ) Adlyxin (lixisenatide) Victoza (liraglutide) Byetta (exanatide) ___________________________________________________________________________  Denise Donaldson will receive your bowel preparation through [facility], which ensures the lowest copay and home delivery, with outreach via text or call from an [phone] number. Please respond promptly to avoid rescheduling of your procedure. If you are interested in alternative options or have any questions regarding your prep, please contact them at [phone] ____________________________________________________________________________  Your Provider Has Sent Your Bowel Prep Regimen To [facility]   [facility] will contact you to verify your information and collect your copay, if applicable.  Enjoy the comfort of your home while your prescription  is mailed to you, FREE of any shipping charges.   [facility] accepts all major insurance benefits and applies discounts & coupons.  Have additional questions?   Chat: [email or web address] Call: [phone] Email: [email or web address] NCPDP: [id number]  How will [facility] contact you?  With a Welcome phone call,  a Welcome text and a checkout link in text form.  Texts you receive from [phone] Are NOT Spam.  *To set up delivery, you must complete the checkout process via link or speak to one of the patient care representatives. If [facility] is unable to reach you, your prescription may be delayed.  To avoid long hold times on the phone, you may also utilize the secure chat feature on the [facility] website to request that they call you back for transaction completion or to expedite your concerns.  _______________________________________________________  If your blood pressure at your visit was 140/90 or greater, please contact your primary care physician to follow up on this.  _______________________________________________________  If you are age 70 or older, your body mass index should be between 23-30. Your Body mass index is 34.02 kg/m. If this is out of the aforementioned range listed, please consider follow up with your Primary Care Provider.  If you are age 28 or younger, your body mass index should be between 19-25. Your Body mass index is 34.02 kg/m. If this is out of the aformentioned range listed, please consider follow up with your Primary Care Provider.   ________________________________________________________  The [facility] providers would like to encourage you to use MYCHART to communicate with providers for non-urgent requests or questions.  Due to long hold times on the telephone, sending your provider a message by [facility] may be a faster and more efficient way to get a response.  Please allow 48 business hours for a response.  Please remember that  this is for non-urgent requests.  _______________________________________________________

## 2023-11-05 ENCOUNTER — Ambulatory Visit (HOSPITAL_COMMUNITY)
Admission: RE | Admit: 2023-11-05 | Discharge: 2023-11-05 | Disposition: A | Discharge status: Home / Self Care | Payer: Medicare HMO | Source: Ambulatory Visit | Attending: Gastroenterology | Admitting: Gastroenterology

## 2023-11-05 ENCOUNTER — Other Ambulatory Visit (HOSPITAL_COMMUNITY): Payer: Medicare HMO

## 2023-11-05 DIAGNOSIS — R6881 Early satiety: Secondary | ICD-10-CM | POA: Diagnosis not present

## 2023-11-05 DIAGNOSIS — R1011 Right upper quadrant pain: Secondary | ICD-10-CM | POA: Insufficient documentation

## 2023-11-05 MED ORDER — TECHNETIUM TC 99M MEBROFENIN IV KIT
5.2000 | PACK | Freq: Once | INTRAVENOUS | Status: AC | PRN
  Administered 2023-11-05: 5.2 via INTRAVENOUS

## 2023-11-23 ENCOUNTER — Telehealth: Payer: Self-pay | Admitting: Gastroenterology

## 2023-11-23 NOTE — Telephone Encounter (Signed)
 Patient states when she comes in for EGD on 3/13 that she will then schedule for colonoscopy.

## 2023-11-23 NOTE — Telephone Encounter (Signed)
 Shanda Bumps can you please review and advise?

## 2023-11-23 NOTE — Telephone Encounter (Signed)
 The pt returned call and aware that she will have ONLY EGD now and colon to be rescheduled as requested. She prefers to call back at a later time to set that up.

## 2023-11-23 NOTE — Telephone Encounter (Signed)
 Left message on machine to call back

## 2023-11-23 NOTE — Telephone Encounter (Signed)
 PT is calling to find out if she can have procedures done separately. She only wants to have EGD and do the colonoscopy in 2 months. Please advise.

## 2023-11-25 ENCOUNTER — Encounter: Payer: Self-pay | Admitting: Gastroenterology

## 2023-12-02 ENCOUNTER — Ambulatory Visit: Payer: Medicare HMO | Admitting: Gastroenterology

## 2023-12-02 ENCOUNTER — Encounter: Payer: Self-pay | Admitting: Gastroenterology

## 2023-12-02 VITALS — BP 142/60 | HR 59 | Temp 97.2°F | Resp 11 | Ht 62.0 in | Wt 186.0 lb

## 2023-12-02 DIAGNOSIS — R1011 Right upper quadrant pain: Secondary | ICD-10-CM

## 2023-12-02 DIAGNOSIS — K31A11 Gastric intestinal metaplasia without dysplasia, involving the antrum: Secondary | ICD-10-CM

## 2023-12-02 DIAGNOSIS — R6881 Early satiety: Secondary | ICD-10-CM | POA: Diagnosis not present

## 2023-12-02 DIAGNOSIS — K3189 Other diseases of stomach and duodenum: Secondary | ICD-10-CM | POA: Diagnosis not present

## 2023-12-02 DIAGNOSIS — I10 Essential (primary) hypertension: Secondary | ICD-10-CM | POA: Diagnosis not present

## 2023-12-02 DIAGNOSIS — K2960 Other gastritis without bleeding: Secondary | ICD-10-CM | POA: Diagnosis not present

## 2023-12-02 DIAGNOSIS — R7303 Prediabetes: Secondary | ICD-10-CM | POA: Diagnosis not present

## 2023-12-02 DIAGNOSIS — E669 Obesity, unspecified: Secondary | ICD-10-CM | POA: Diagnosis not present

## 2023-12-02 MED ORDER — SODIUM CHLORIDE 0.9 % IV SOLN: 500.0000 mL | Freq: Once | INTRAVENOUS | Status: DC

## 2023-12-02 NOTE — Progress Notes (Signed)
 [facility] History and Physical   Primary Care Physician:  Tresa Garter, MD   Reason for Procedure:  Early satiety, dyspepsia  Plan:    EGD  with possible interventions as needed     HPI: Denise Donaldson is a very pleasant 68 y.o. female here for EGD for evaluation of early satiety, dyspepsia and RUQ abdominal pain   The risks and benefits as well as alternatives of endoscopic procedure(s) have been discussed and reviewed. All questions answered. The patient agrees to proceed.    Past Medical History:  Diagnosis Date   Barrett's esophagus    Chest pain    Costochondral chest pain 2005   Left   Diverticulosis    Foot fracture, right    Dr Otelia Sergeant   Foot swelling    GERD (gastroesophageal reflux disease)    Dr Juanda Chance, EGD 2011 WNL   Head ache    Helicobacter pylori gastritis    History of pneumonia    HTN (hypertension)    Hyperplastic colon polyp    Menopausal disorder    After 52 Dr Rana Snare   Mini stroke    Numbness of lower limb    Numbness of upper limb    Obesity    Other fatigue    Pectus excavatum    Pre-diabetes    Spine pain    Venous insufficiency    Bilat LE   Vitamin D deficiency     Past Surgical History:  Procedure Laterality Date   APPENDECTOMY     BREAST EXCISIONAL BIOPSY Left    left breast excisional biopsy 1980   CARDIAC CATHETERIZATION  2006   had double vision past cath   CESAREAN SECTION     1 time   COLONOSCOPY  04/27/2012   Procedure: COLONOSCOPY;  Surgeon: Hart Carwin, MD;  Location: [facility];  Service: Endoscopy;  Laterality: N/A;   double vision  2006   past cardiac cath/ lasted 9 months and went away   ESOPHAGOGASTRODUODENOSCOPY  04/27/2012   Procedure: ESOPHAGOGASTRODUODENOSCOPY (EGD);  Surgeon: Hart Carwin, MD;  Location: [facility];  Service: Endoscopy;  Laterality: N/A;   OVARIAN CYST REMOVAL     partial    Prior to Admission medications   Medication Sig Start Date End Date Taking?  Authorizing Provider  amLODipine (NORVASC) 2.5 MG tablet Take 1 tablet (2.5 mg total) by mouth 2 (two) times daily. 03/17/23  Yes Plotnikov, Georgina Quint, MD  b complex vitamins tablet Take 1 tablet by mouth daily. 02/23/19  Yes Plotnikov, Georgina Quint, MD  Cholecalciferol (VITAMIN D3) 50 MCG (2000 UT) CAPS Take 1 capsule (2,000 Units total) by mouth daily. 12/15/22  Yes Plotnikov, Georgina Quint, MD  nebivolol (BYSTOLIC) 10 MG tablet Take 5 mg by mouth 2 (two) times daily. 10/15/23  Yes [provider]  cloNIDine (CATAPRES) 0.1 MG tablet Take 1 tablet (0.1 mg total) by mouth 2 (two) times daily as needed (for blood pressure over 180). Patient not taking: Reported on 12/02/2023 06/06/20   Plotnikov, Georgina Quint, MD  metFORMIN (GLUCOPHAGE) 500 MG tablet Take 1 tablet (500 mg total) by mouth daily with breakfast. Patient taking differently: Take 500 mg by mouth as needed. 12/16/22   Plotnikov, Georgina Quint, MD  pantoprazole (PROTONIX) 40 MG tablet Take 1 tablet (40 mg total) by mouth daily. PRN Patient not taking: Reported on 12/02/2023 01/08/22   Plotnikov, Georgina Quint, MD    Current Outpatient Medications  Medication Sig Dispense Refill   amLODipine (NORVASC)  2.5 MG tablet Take 1 tablet (2.5 mg total) by mouth 2 (two) times daily. 180 tablet 3   b complex vitamins tablet Take 1 tablet by mouth daily. 100 tablet 3   Cholecalciferol (VITAMIN D3) 50 MCG (2000 UT) CAPS Take 1 capsule (2,000 Units total) by mouth daily. 100 capsule 3   nebivolol (BYSTOLIC) 10 MG tablet Take 5 mg by mouth 2 (two) times daily.     cloNIDine (CATAPRES) 0.1 MG tablet Take 1 tablet (0.1 mg total) by mouth 2 (two) times daily as needed (for blood pressure over 180). (Patient not taking: Reported on 12/02/2023) 60 tablet 1   metFORMIN (GLUCOPHAGE) 500 MG tablet Take 1 tablet (500 mg total) by mouth daily with breakfast. (Patient taking differently: Take 500 mg by mouth as needed.) 90 tablet 3   pantoprazole (PROTONIX) 40 MG tablet Take 1  tablet (40 mg total) by mouth daily. PRN (Patient not taking: Reported on 12/02/2023) 90 tablet 3   Current Facility-Administered Medications  Medication Dose Route Frequency Provider Last Rate Last Admin   0.9 %  sodium chloride infusion  500 mL Intravenous Once Napoleon Form, MD        Allergies as of 12/02/2023 - Review Complete 12/02/2023  Allergen Reaction Noted   Penicillin g Other (See Comments) 05/03/2019   Penicillins Other (See Comments) 09/18/2022   Coreg [carvedilol] Other (See Comments) 03/02/2018   Metoprolol tartrate  05/06/2009   Lisinopril Other (See Comments) 11/07/2020   Losartan Other (See Comments) 06/01/2016   Semaglutide(0.25 or 0.5mg -dos) Nausea And Vomiting 01/08/2022    Family History  Problem Relation Age of Onset   Diabetes Mother    Gallstones Mother        deceased from stone   Jaundice Mother    Liver disease Mother    Prostate cancer Father    Heart disease Father    Hypertension Father    Heart failure Father    Hypertension Sister    Peptic Ulcer Sister    Heart disease Maternal Grandmother    Stroke Paternal Grandmother    Prostate cancer Paternal Grandfather 42   Esophageal cancer Paternal Grandfather    Diabetes Maternal Aunt    Colon cancer Paternal Uncle    Stomach cancer Paternal Uncle 70   Liver cancer Neg Hx    Rectal cancer Neg Hx     Social History   Socioeconomic History   Marital status: Married    Spouse name: Alecia Lemming   Number of children: 3   Years of education: Not on file   Highest education level: Not on file  Occupational History   Occupation: retired Regulatory affairs officer  Tobacco Use   Smoking status: Never   Smokeless tobacco: Never  Vaping Use   Vaping status: Never Used  Substance and Sexual Activity   Alcohol use: No   Drug use: No   Sexual activity: Yes    Birth control/protection: Post-menopausal  Other Topics Concern   Not on file  Social History Narrative   Daily Caffeine - 2/dayRegular Exercise -   NO   Lives with husband and has 6 dogs and 1 cat.   Social Drivers of Corporate investment banker Strain: Low Risk  (07/13/2023)   Overall Financial Resource Strain (CARDIA)    Difficulty of Paying Living Expenses: Not very hard  Food Insecurity: No Food Insecurity (07/13/2023)   Hunger Vital Sign    Worried About Running Out of Food in the Last Year: Never true  Ran Out of Food in the Last Year: Never true  Transportation Needs: No Transportation Needs (07/13/2023)   PRAPARE - Administrator, Civil Service (Medical): No    Lack of Transportation (Non-Medical): No  Physical Activity: Inactive (07/13/2023)   Exercise Vital Sign    Days of Exercise per Week: 0 days    Minutes of Exercise per Session: 0 min  Stress: No Stress Concern Present (07/13/2023)   [employer] of Occupational Health - Occupational Stress Questionnaire    Feeling of Stress : Not at all  Social Connections: Moderately Integrated (07/13/2023)   Social Connection and Isolation Panel [NHANES]    Frequency of Communication with Friends and Family: More than three times a week    Frequency of Social Gatherings with Friends and Family: Once a week    Attends Religious Services: More than 4 times per year    Active Member of [employer] or Organizations: No    Attends Banker Meetings: Never    Marital Status: Married  Catering manager Violence: Not At Risk (07/13/2023)   Humiliation, Afraid, Rape, and Kick questionnaire    Fear of Current or Ex-Partner: No    Emotionally Abused: No    Physically Abused: No    Sexually Abused: No    Review of Systems:  All other review of systems negative except as mentioned in the HPI.  Physical Exam: Vital signs in last 24 hours: BP 130/66 (Cuff Size: Normal)   Pulse 60   Temp (!) 97.2 F (36.2 C)   Ht 5\' 2"  (1.575 m)   Wt 186 lb (84.4 kg)   SpO2 96%   BMI 34.02 kg/m  General:   Alert, NAD Lungs:  Clear .   Heart:  Regular rate and  rhythm Abdomen:  Soft, nontender and nondistended. Neuro/Psych:  Alert and cooperative. Normal mood and affect. A and O x 3  Reviewed labs, radiology imaging, old records and pertinent past GI work up  Patient is appropriate for planned procedure(s) and anesthesia in an ambulatory setting   K. Scherry Ran , MD [phone]

## 2023-12-02 NOTE — Progress Notes (Signed)
 Called to room to assist during endoscopic procedure.  Patient ID and intended procedure confirmed with present staff. Received instructions for my participation in the procedure from the performing physician.

## 2023-12-02 NOTE — Progress Notes (Signed)
 Sedate, gd SR, tolerated procedure well, VSS, report to RN

## 2023-12-02 NOTE — Op Note (Addendum)
 [facility] Patient Name: Denise Donaldson Procedure Date: 12/02/2023 1:51 PM MRN: [id number] Endoscopist: Napoleon Form , MD, [id number] Age: 68 Referring MD:  Date of Birth: 02-28-1956 Gender: Female Account #: [id number] Procedure:                Upper GI endoscopy Indications:              Abdominal pain in the right upper quadrant,                            Dyspepsia, Abdominal bloating, Early satiety Medicines:                Monitored Anesthesia Care Procedure:                Pre-Anesthesia Assessment:                           - Prior to the procedure, a History and Physical                            was performed, and patient medications and                            allergies were reviewed. The patient's tolerance of                            previous anesthesia was also reviewed. The risks                            and benefits of the procedure and the sedation                            options and risks were discussed with the patient.                            All questions were answered, and informed consent                            was obtained. Prior Anticoagulants: The patient has                            taken no anticoagulant or antiplatelet agents. ASA                            Grade Assessment: II - A patient with mild systemic                            disease. After reviewing the risks and benefits,                            the patient was deemed in satisfactory condition to                            undergo the procedure.  After obtaining informed consent, the endoscope was                            passed under direct vision. Throughout the                            procedure, the patient's blood pressure, pulse, and                            oxygen saturations were monitored continuously. The                            Olympus scope [id number] was introduced through the                             mouth, and advanced to the second part of duodenum.                            The upper GI endoscopy was accomplished without                            difficulty. The patient tolerated the procedure                            well. Scope In: Scope Out: Findings:                 The Z-line was regular and was found 35 cm from the                            incisors.                           No gross lesions were noted in the entire esophagus.                           The gastroesophageal flap valve was visualized                            endoscopically and classified as Hill Grade III                            (minimal fold, loose to endoscope, hiatal hernia                            likely).                           Patchy mild inflammation characterized by                            congestion (edema), erythema, friability and                            granularity was found in the entire examined  stomach. Biopsies were taken with a cold forceps                            for Helicobacter pylori testing.                           The cardia and gastric fundus were normal on                            retroflexion.                           The examined duodenum was normal. Complications:            No immediate complications. Estimated Blood Loss:     Estimated blood loss was minimal. Impression:               - Z-line regular, 35 cm from the incisors.                           - No gross lesions in the entire esophagus.                           - Gastroesophageal flap valve classified as Hill                            Grade III (minimal fold, loose to endoscope, hiatal                            hernia likely).                           - Erosive gastritis. Biopsied.                           - Normal examined duodenum. Recommendation:           - Patient has a contact number available for                            emergencies. The signs and  symptoms of potential                            delayed complications were discussed with the                            patient. Return to normal activities tomorrow.                            Written discharge instructions were provided to the                            patient.                           - Resume previous diet.                           -  Continue present medications.                           - Await pathology results.                           - Follow an antireflux regimen.                           - Schedule liver fibroscan/elastography Napoleon Form, MD 12/02/2023 2:27:51 PM This report has been signed electronically.

## 2023-12-02 NOTE — Patient Instructions (Signed)

## 2023-12-03 ENCOUNTER — Telehealth: Payer: Self-pay

## 2023-12-03 NOTE — Telephone Encounter (Signed)
  Follow up Call-     12/02/2023    1:45 PM  Call back number  Permission to leave phone message Yes     Patient questions:  Do you have a fever, pain , or abdominal swelling? No. Pain Score  0 *  Have you tolerated food without any problems? Yes.    Have you been able to return to your normal activities? Yes.    Do you have any questions about your discharge instructions: Diet   No. Medications  No. Follow up visit  No.  Do you have questions or concerns about your Care? No.  Actions: * If pain score is 4 or above: No action needed, pain <4.

## 2023-12-06 ENCOUNTER — Other Ambulatory Visit: Payer: Self-pay | Admitting: *Deleted

## 2023-12-06 ENCOUNTER — Telehealth: Payer: Self-pay | Admitting: *Deleted

## 2023-12-06 DIAGNOSIS — R748 Abnormal levels of other serum enzymes: Secondary | ICD-10-CM

## 2023-12-06 DIAGNOSIS — R6881 Early satiety: Secondary | ICD-10-CM

## 2023-12-06 DIAGNOSIS — R1011 Right upper quadrant pain: Secondary | ICD-10-CM

## 2023-12-06 NOTE — Telephone Encounter (Signed)
 [place] Elastography Liver   [facility] 12/14/2023 at 9:30 am. Nothing to eat or drink after midnight arrive 15 minutes prior to appointment. Go through main entrance and tell the front desk you have an appointment with Radiology. They will show you the way to check in at radiology.   Called patient to inform her when her [place] is scheduled and instructions. She agreed with the date and time.  Offered her Radiology's number in case she needed to reschedule and she didn't want it.

## 2023-12-07 LAB — SURGICAL PATHOLOGY

## 2023-12-14 ENCOUNTER — Ambulatory Visit (HOSPITAL_COMMUNITY)
Admission: RE | Admit: 2023-12-14 | Discharge: 2023-12-14 | Disposition: A | Discharge status: Home / Self Care | Source: Ambulatory Visit | Attending: Gastroenterology | Admitting: Gastroenterology

## 2023-12-14 DIAGNOSIS — R748 Abnormal levels of other serum enzymes: Secondary | ICD-10-CM | POA: Diagnosis not present

## 2023-12-14 DIAGNOSIS — R1011 Right upper quadrant pain: Secondary | ICD-10-CM | POA: Diagnosis not present

## 2023-12-14 DIAGNOSIS — R6881 Early satiety: Secondary | ICD-10-CM | POA: Diagnosis not present

## 2024-01-12 ENCOUNTER — Encounter: Payer: Self-pay | Admitting: Internal Medicine

## 2024-01-12 ENCOUNTER — Ambulatory Visit (INDEPENDENT_AMBULATORY_CARE_PROVIDER_SITE_OTHER): Payer: Medicare HMO | Admitting: Internal Medicine

## 2024-01-12 VITALS — BP 112/68 | HR 64 | Temp 97.8°F | Ht 62.0 in | Wt 185.4 lb

## 2024-01-12 DIAGNOSIS — I10 Essential (primary) hypertension: Secondary | ICD-10-CM

## 2024-01-12 DIAGNOSIS — E1169 Type 2 diabetes mellitus with other specified complication: Secondary | ICD-10-CM

## 2024-01-12 DIAGNOSIS — K2100 Gastro-esophageal reflux disease with esophagitis, without bleeding: Secondary | ICD-10-CM | POA: Diagnosis not present

## 2024-01-12 DIAGNOSIS — E559 Vitamin D deficiency, unspecified: Secondary | ICD-10-CM

## 2024-01-12 DIAGNOSIS — E669 Obesity, unspecified: Secondary | ICD-10-CM | POA: Diagnosis not present

## 2024-01-12 DIAGNOSIS — Z7984 Long term (current) use of oral hypoglycemic drugs: Secondary | ICD-10-CM | POA: Diagnosis not present

## 2024-01-12 DIAGNOSIS — L255 Unspecified contact dermatitis due to plants, except food: Secondary | ICD-10-CM

## 2024-01-12 DIAGNOSIS — L259 Unspecified contact dermatitis, unspecified cause: Secondary | ICD-10-CM | POA: Insufficient documentation

## 2024-01-12 LAB — COMPREHENSIVE METABOLIC PANEL WITH GFR
ALT: 33 U/L (ref 0–35)
AST: 25 U/L (ref 0–37)
Albumin: 4.2 g/dL (ref 3.5–5.2)
Alkaline Phosphatase: 51 U/L (ref 39–117)
BUN: 12 mg/dL (ref 6–23)
CO2: 21 meq/L (ref 19–32)
Calcium: 9.5 mg/dL (ref 8.4–10.5)
Chloride: 110 meq/L (ref 96–112)
Creatinine, Ser: 0.62 mg/dL (ref 0.40–1.20)
GFR: 90.45 mL/min (ref >60.00)
Glucose, Bld: 96 mg/dL (ref 70–99)
Potassium: 4.1 meq/L (ref 3.5–5.1)
Sodium: 138 meq/L (ref 135–145)
Total Bilirubin: 0.3 mg/dL (ref 0.2–1.2)
Total Protein: 7.2 g/dL (ref 6.0–8.3)

## 2024-01-12 LAB — HEMOGLOBIN A1C: Hgb A1c MFr Bld: 6.3 % (ref 4.6–6.5)

## 2024-01-12 MED ORDER — TRIAMCINOLONE ACETONIDE 0.5 % EX OINT: 1.0000 | TOPICAL_OINTMENT | Freq: Four times a day (QID) | CUTANEOUS | 2 refills | Status: AC

## 2024-01-12 MED ORDER — DIPHENHYDRAMINE HCL 25 MG PO TABS: 25.0000 mg | ORAL_TABLET | Freq: Four times a day (QID) | ORAL | 0 refills | Status: AC | PRN

## 2024-01-12 NOTE — Assessment & Plan Note (Signed)
Given prescription for weekly vitamin D.  After  vitamin D prescription is finished, get "Vit D3+K2 " vitamin and take daily.  Continue with a B complex.  This should help with hair and nails. [name]..

## 2024-01-12 NOTE — Patient Instructions (Addendum)
 Hard sole shoes: [employer]

## 2024-01-12 NOTE — Assessment & Plan Note (Signed)
  On Norvasc  Zebeta  5 mg/d

## 2024-01-12 NOTE — Assessment & Plan Note (Signed)
 On Metformin - not every day Check A1c Pt declined Rybelsus, Marcelline Deist

## 2024-01-12 NOTE — Assessment & Plan Note (Signed)
 B forearms Triamcinolone  cream QID Benadryl  prn Pt refused steroids

## 2024-01-12 NOTE — Assessment & Plan Note (Signed)
?  Chronic  ?Cont on Protonix ?

## 2024-01-12 NOTE — Progress Notes (Signed)
 Subjective:  Patient ID: Denise Donaldson, female    DOB: 02-07-1956  Age: 68 y.o. MRN: [id number]  CC: Medical Management of Chronic Issues (3 Month Follow Up. Patient noted exposure to poison ivy. Currently treating with triamcinolone  which is helping but is expired)   HPI Denise Donaldson presents for  HTN, GERD, DM2 C/o rash on arms  Outpatient Medications Prior to Visit  Medication Sig Dispense Refill   amLODipine  (NORVASC ) 2.5 MG tablet Take 1 tablet (2.5 mg total) by mouth 2 (two) times daily. 180 tablet 3   b complex vitamins tablet Take 1 tablet by mouth daily. 100 tablet 3   Cholecalciferol  (VITAMIN D3) 50 MCG (2000 UT) CAPS Take 1 capsule (2,000 Units total) by mouth daily. 100 capsule 3   metFORMIN  (GLUCOPHAGE ) 500 MG tablet Take 1 tablet (500 mg total) by mouth daily with breakfast. (Patient taking differently: Take 500 mg by mouth as needed.) 90 tablet 3   nebivolol  (BYSTOLIC ) 10 MG tablet Take 5 mg by mouth 2 (two) times daily.     cloNIDine  (CATAPRES ) 0.1 MG tablet Take 1 tablet (0.1 mg total) by mouth 2 (two) times daily as needed (for blood pressure over 180). (Patient not taking: Reported on 10/29/2023) 60 tablet 1   pantoprazole  (PROTONIX ) 40 MG tablet Take 1 tablet (40 mg total) by mouth daily. PRN (Patient not taking: Reported on 01/12/2024) 90 tablet 3   No facility-administered medications prior to visit.    ROS: Review of Systems  Constitutional:  Negative for activity change, appetite change, chills, fatigue and unexpected weight change.  HENT:  Negative for congestion, mouth sores and sinus pressure.   Eyes:  Negative for visual disturbance.  Respiratory:  Negative for cough and chest tightness.   Gastrointestinal:  Negative for abdominal pain and nausea.  Genitourinary:  Negative for difficulty urinating, frequency and vaginal pain.  Musculoskeletal:  Negative for back pain and gait problem.  Skin:  Negative for pallor and rash.  Neurological:   Negative for dizziness, tremors, weakness, numbness and headaches.  Hematological:  Does not bruise/bleed easily.  Psychiatric/Behavioral:  Negative for confusion, sleep disturbance and suicidal ideas.     Objective:  BP 112/68   Pulse 64   Temp 97.8 F (36.6 C)   Ht 5\' 2"  (1.575 m)   Wt 185 lb 6.4 oz (84.1 kg)   SpO2 97%   BMI 33.91 kg/m   BP Readings from Last 3 Encounters:  01/12/24 112/68  12/02/23 (!) 142/60  10/29/23 (!) 146/80    Wt Readings from Last 3 Encounters:  01/12/24 185 lb 6.4 oz (84.1 kg)  12/02/23 186 lb (84.4 kg)  10/29/23 186 lb (84.4 kg)    Physical Exam Constitutional:      General: She is not in acute distress.    Appearance: She is well-developed.  HENT:     Head: Normocephalic.     Right Ear: External ear normal.     Left Ear: External ear normal.     Nose: Nose normal.  Eyes:     General:        Right eye: No discharge.        Left eye: No discharge.     Conjunctiva/sclera: Conjunctivae normal.     Pupils: Pupils are equal, round, and reactive to light.  Neck:     Thyroid : No thyromegaly.     Vascular: No JVD.     Trachea: No tracheal deviation.  Cardiovascular:     Rate and Rhythm:  Normal rate and regular rhythm.     Heart sounds: Normal heart sounds.  Pulmonary:     Effort: No respiratory distress.     Breath sounds: No stridor. No wheezing.  Abdominal:     General: Bowel sounds are normal. There is no distension.     Palpations: Abdomen is soft. There is no mass.     Tenderness: There is no abdominal tenderness. There is no guarding or rebound.  Musculoskeletal:        General: No tenderness.     Cervical back: Normal range of motion and neck supple. No rigidity.     Right lower leg: No edema.     Left lower leg: No edema.  Lymphadenopathy:     Cervical: No cervical adenopathy.  Skin:    Findings: No erythema or rash.  Neurological:     Mental Status: She is oriented to person, place, and time.     Cranial Nerves: No  cranial nerve deficit.     Motor: No abnormal muscle tone.     Coordination: Coordination normal.     Deep Tendon Reflexes: Reflexes normal.  Psychiatric:        Behavior: Behavior normal.        Thought Content: Thought content normal.        Judgment: Judgment normal.     Lab Results  Component Value Date   WBC 6.5 12/15/2022   HGB 14.8 12/15/2022   HCT 43.9 12/15/2022   PLT 384.0 12/15/2022   GLUCOSE 79 10/14/2023   CHOL 244 (H) 03/10/2022   TRIG 140.0 03/10/2022   HDL 67.90 03/10/2022   LDLCALC 148 (H) 03/10/2022   ALT 42 (H) 10/14/2023   AST 30 10/14/2023   NA 139 10/14/2023   K 5.0 10/14/2023   CL 105 10/14/2023   CREATININE 0.69 10/14/2023   BUN 13 10/14/2023   CO2 27 10/14/2023   TSH 1.28 07/29/2023   HGBA1C 6.6 (H) 10/14/2023    US  ELASTOGRAPHY LIVER Result Date: 12/24/2023 CLINICAL DATA:  Elevated liver enzymes EXAM: US  ELASTOGRAPHY HEPATIC TECHNIQUE: Sonography of the liver was performed. In addition, ultrasound elastography evaluation of the liver was performed. A region of interest was placed within the right lobe of the liver. Following application of a compressive sonographic pulse, tissue compressibility was assessed. Multiple assessments were performed at the selected site. Median tissue compressibility was determined. Previously, hepatic stiffness was assessed by shear wave velocity. Based on recently published Society of Radiologists in Ultrasound consensus article, reporting is now recommended to be performed in the SI units of pressure (kiloPascals) representing hepatic stiffness/elasticity. The obtained result is compared to the published reference standards. (cACLD = compensated Advanced Chronic Liver Disease) COMPARISON:  Ultrasound 10/21/2023.  HIDA scan 11/05/2023. FINDINGS: Liver: Diffusely echogenic hepatic parenchyma consistent with fatty liver infiltration. With this level of echogenicity evaluation for underlying mass lesion is limited and if needed  follow-up contrast CT or MRI as clinically appropriate. Portal vein is patent on color Doppler imaging with normal direction of blood flow towards the liver. Some limitation with overlapping bowel gas and soft tissue. ULTRASOUND HEPATIC ELASTOGRAPHY Device: Siemens Helix VTQ Patient position: Supine Transducer: DAX Number of measurements: 12 Hepatic segment:  8 Median kPa: 4.2 IQR: 1.8 IQR/Median kPa ratio: 0.41 Data quality: IQR/Median kPa ratio of 0.3 or greater indicates reduced accuracy Diagnostic category:  < or = 5 kPa: high probability of being normal The use of hepatic elastography is applicable to patients with viral hepatitis and  non-alcoholic fatty liver disease. At this time, there is insufficient data for the referenced cut-off values and use in other causes of liver disease, including alcoholic liver disease. Patients, however, may be assessed by elastography and serve as their own reference standard/baseline. In patients with non-alcoholic liver disease, the values suggesting compensated advanced chronic liver disease (cACLD) may be lower, and patients may need additional testing with elasticity results of 7-9 kPa. Please note that abnormal hepatic elasticity and shear wave velocities may also be identified in clinical settings other than with hepatic fibrosis, such as: acute hepatitis, elevated right heart and central venous pressures including use of beta blockers, veno-occlusive disease (Budd-Chiari), infiltrative processes such as mastocytosis/amyloidosis/infiltrative tumor/lymphoma, extrahepatic cholestasis, with hyperemia in the post-prandial state, and with liver transplantation. Correlation with patient history, laboratory data, and clinical condition recommended. Diagnostic Categories: < or =5 kPa: high probability of being normal < or =9 kPa: in the absence of other known clinical signs, rules out cACLD >9 kPa and ?13 kPa: suggestive of cACLD, but needs further testing >13 kPa: highly  suggestive of cACLD > or =17 kPa: highly suggestive of cACLD with an increased probability of clinically significant portal hypertension IMPRESSION: ULTRASOUND LIVER: Fatty liver infiltration. ULTRASOUND HEPATIC ELASTOGRAPHY: Median kPa:  4.2 Diagnostic category:  1.8 In the setting of elevated liver function tests, non-fasting state, or vascular congestion, the stage of liver fibrosis may be overestimated. In some patients with NAFLD, the cut-off values for cACLD may be lower (7-9 kPa). In causes other than viral hepatitis and NAFLD, the cut-off values are not well established. Electronically Signed   By: Adrianna Horde M.D.   On: 12/24/2023 18:24    Assessment & Plan:   Problem List Items Addressed This Visit     Essential hypertension    On Norvasc  Zebeta  5 mg/d      GERD (gastroesophageal reflux disease)   Chronic Cont on Protonix       Type 2 diabetes mellitus with obesity (HCC)   On Metformin  - not every day Check A1c Pt declined Rybelsus , Farxiga      Contact dermatitis - Primary   B forearms Triamcinolone  cream QID Benadryl  prn Pt refused steroids         Meds ordered this encounter  Medications   triamcinolone  ointment (KENALOG ) 0.5 %    Sig: Apply 1 Application topically 4 (four) times daily.    Dispense:  120 g    Refill:  2   diphenhydrAMINE  (BENADRYL  ALLERGY) 25 MG tablet    Sig: Take 1 tablet (25 mg total) by mouth every 6 (six) hours as needed.    Dispense:  30 tablet    Refill:  0      Follow-up: Return in about 3 months (around 04/12/2024) for a follow-up visit.  Anitra Barn, MD

## 2024-01-13 ENCOUNTER — Encounter: Payer: Self-pay | Admitting: Internal Medicine

## 2024-01-27 ENCOUNTER — Encounter: Payer: Self-pay | Admitting: Gastroenterology

## 2024-04-03 ENCOUNTER — Ambulatory Visit (AMBULATORY_SURGERY_CENTER)

## 2024-04-03 VITALS — Ht 62.0 in | Wt 180.0 lb

## 2024-04-03 DIAGNOSIS — Z1211 Encounter for screening for malignant neoplasm of colon: Secondary | ICD-10-CM

## 2024-04-03 NOTE — Progress Notes (Signed)

## 2024-04-04 ENCOUNTER — Encounter: Payer: Self-pay | Admitting: Gastroenterology

## 2024-04-06 ENCOUNTER — Encounter: Payer: Self-pay | Admitting: Gastroenterology

## 2024-04-06 ENCOUNTER — Ambulatory Visit

## 2024-04-06 VITALS — Ht 62.0 in | Wt 180.0 lb

## 2024-04-06 DIAGNOSIS — E669 Obesity, unspecified: Secondary | ICD-10-CM

## 2024-04-06 DIAGNOSIS — E1169 Type 2 diabetes mellitus with other specified complication: Secondary | ICD-10-CM

## 2024-04-06 DIAGNOSIS — Z780 Asymptomatic menopausal state: Secondary | ICD-10-CM | POA: Diagnosis not present

## 2024-04-06 DIAGNOSIS — Z0000 Encounter for general adult medical examination without abnormal findings: Secondary | ICD-10-CM | POA: Diagnosis not present

## 2024-04-06 DIAGNOSIS — Z1159 Encounter for screening for other viral diseases: Secondary | ICD-10-CM

## 2024-04-06 DIAGNOSIS — Z1231 Encounter for screening mammogram for malignant neoplasm of breast: Secondary | ICD-10-CM

## 2024-04-06 NOTE — Progress Notes (Cosign Needed Addendum)
 Subjective:   Denise Donaldson is a 68 y.o. who presents for a Medicare Wellness preventive visit.  As a reminder, Annual Wellness Visits don't include a physical exam, and some assessments may be limited, especially if this visit is performed virtually. We may recommend an in-person follow-up visit with your provider if needed.  Visit Complete: Virtual I connected with  Denise Donaldson on 04/06/24 by a audio enabled telemedicine application and verified that I am speaking with the correct person using two identifiers.  Patient Location: Home  Provider Location: Office/Clinic  I discussed the limitations of evaluation and management by telemedicine. The patient expressed understanding and agreed to proceed.  Vital Signs: Because this visit was a virtual/telehealth visit, some criteria may be missing or patient reported. Any vitals not documented were not able to be obtained and vitals that have been documented are patient reported.  VideoDeclined- This patient declined Librarian, academic. Therefore the visit was completed with audio only.  Persons Participating in Visit: Patient.  AWV Questionnaire: No: Patient Medicare AWV questionnaire was not completed prior to this visit.  Cardiac Risk Factors include: advanced age (>66men, >60 women);diabetes mellitus;hypertension;obesity (BMI >30kg/m2)     Objective:    Today's Vitals   04/06/24 1503  Weight: 180 lb (81.6 kg)  Height: 5' 2 (1.575 m)   Body mass index is 32.92 kg/m.     04/06/2024    3:03 PM 07/13/2023    1:55 PM 07/15/2022    2:47 PM 06/25/2021    1:16 PM 06/24/2020    9:53 AM 03/06/2019    2:46 PM 04/27/2012    8:11 AM  Advanced Directives  Does Patient Have a Medical Advance Directive? No No No No No No Patient does not have advance directive   Would patient like information on creating a medical advance directive? No - Patient declined  No - Patient declined No - Patient declined  No - Patient declined No - Patient declined       Data saved with a previous flowsheet row definition    Current Medications (verified) Outpatient Encounter Medications as of 04/06/2024  Medication Sig   amLODipine  (NORVASC ) 2.5 MG tablet Take 1 tablet (2.5 mg total) by mouth 2 (two) times daily.   b complex vitamins tablet Take 1 tablet by mouth daily.   Cholecalciferol  (VITAMIN D3) 50 MCG (2000 UT) CAPS Take 1 capsule (2,000 Units total) by mouth daily.   cloNIDine  (CATAPRES ) 0.1 MG tablet Take 1 tablet (0.1 mg total) by mouth 2 (two) times daily as needed (for blood pressure over 180).   diphenhydrAMINE  (BENADRYL  ALLERGY) 25 MG tablet Take 1 tablet (25 mg total) by mouth every 6 (six) hours as needed.   metFORMIN  (GLUCOPHAGE ) 500 MG tablet Take 1 tablet (500 mg total) by mouth daily with breakfast.   nebivolol  (BYSTOLIC ) 10 MG tablet Take 5 mg by mouth 2 (two) times daily.   triamcinolone  ointment (KENALOG ) 0.5 % Apply 1 Application topically 4 (four) times daily.   pantoprazole  (PROTONIX ) 40 MG tablet Take 1 tablet (40 mg total) by mouth daily. PRN (Patient not taking: Reported on 04/06/2024)   No facility-administered encounter medications on file as of 04/06/2024.    Allergies (verified) Penicillin g, Penicillins, Coreg  [carvedilol ], Metoprolol tartrate, Lisinopril, Losartan , and Semaglutide (0.25 or 0.5mg -dos)   History: Past Medical History:  Diagnosis Date   Barrett's esophagus    Chest pain    Costochondral chest pain 2005   Left   Diverticulosis  Foot fracture, right    Dr Lucilla   Foot swelling    GERD (gastroesophageal reflux disease)    Dr Obie, EGD 2011 WNL   Head ache    Helicobacter pylori gastritis    History of pneumonia    HTN (hypertension)    Hyperplastic colon polyp    Menopausal disorder    After 52 Dr Marget   Mini stroke    Numbness of lower limb    Numbness of upper limb    Obesity    Other fatigue    Pectus excavatum    Pre-diabetes    Spine  pain    Venous insufficiency    Bilat LE   Vitamin D  deficiency    Past Surgical History:  Procedure Laterality Date   APPENDECTOMY     BREAST EXCISIONAL BIOPSY Left    left breast excisional biopsy 1980   CARDIAC CATHETERIZATION  2006   had double vision past cath   CESAREAN SECTION     1 time   COLONOSCOPY  04/27/2012   Procedure: COLONOSCOPY;  Surgeon: Princella CHRISTELLA Obie, MD;  Location: [facility];  Service: Endoscopy;  Laterality: N/A;   double vision  2006   past cardiac cath/ lasted 9 months and went away   ESOPHAGOGASTRODUODENOSCOPY  04/27/2012   Procedure: ESOPHAGOGASTRODUODENOSCOPY (EGD);  Surgeon: Princella CHRISTELLA Obie, MD;  Location: [facility];  Service: Endoscopy;  Laterality: N/A;   OVARIAN CYST REMOVAL     partial   Family History  Problem Relation Age of Onset   Diabetes Mother    Gallstones Mother        deceased from stone   Jaundice Mother    Liver disease Mother    Prostate cancer Father    Heart disease Father    Hypertension Father    Heart failure Father    Hypertension Sister    Peptic Ulcer Sister    Heart disease Maternal Grandmother    Stroke Paternal Grandmother    Prostate cancer Paternal Grandfather 5   Esophageal cancer Paternal Grandfather    Diabetes Maternal Aunt    Colon cancer Paternal Uncle    Stomach cancer Paternal Uncle 52   Liver cancer Neg Hx    Rectal cancer Neg Hx    Social History   Socioeconomic History   Marital status: Married    Spouse name: Denise Donaldson   Number of children: 3   Years of education: Not on file   Highest education level: Not on file  Occupational History   Occupation: retired Regulatory affairs officer  Tobacco Use   Smoking status: Never   Smokeless tobacco: Never  Vaping Use   Vaping status: Never Used  Substance and Sexual Activity   Alcohol use: No   Drug use: No   Sexual activity: Yes    Birth control/protection: Post-menopausal  Other Topics Concern   Not on file  Social History Narrative   Daily Caffeine -  2/dayRegular Exercise -  NO   Lives with husband and has 6 dogs and 1 cat.   Social Drivers of Corporate investment banker Strain: Low Risk  (04/06/2024)   Overall Financial Resource Strain (CARDIA)    Difficulty of Paying Living Expenses: Not hard at all  Food Insecurity: No Food Insecurity (04/06/2024)   Hunger Vital Sign    Worried About Running Out of Food in the Last Year: Never true    Ran Out of Food in the Last Year: Never true  Transportation Needs: No Transportation  Needs (04/06/2024)   PRAPARE - Administrator, Civil Service (Medical): No    Lack of Transportation (Non-Medical): No  Physical Activity: Sufficiently Active (04/06/2024)   Exercise Vital Sign    Days of Exercise per Week: 7 days    Minutes of Exercise per Session: 60 min  Stress: No Stress Concern Present (04/06/2024)   [employer] of Occupational Health - Occupational Stress Questionnaire    Feeling of Stress: Not at all  Social Connections: Moderately Integrated (04/06/2024)   Social Connection and Isolation Panel    Frequency of Communication with Friends and Family: More than three times a week    Frequency of Social Gatherings with Friends and Family: Once a week    Attends Religious Services: More than 4 times per year    Active Member of [employer] or Organizations: No    Attends Engineer, structural: Never    Marital Status: Married    Tobacco Counseling Counseling given: No    Clinical Intake:  Pre-visit preparation completed: Yes  Pain : No/denies pain     BMI - recorded: 32.92 Nutritional Status: BMI > 30  Obese Nutritional Risks: None Diabetes: Yes CBG done?: No Did pt. bring in CBG monitor from home?: No  Lab Results  Component Value Date   HGBA1C 6.3 01/12/2024   HGBA1C 6.6 (H) 10/14/2023   HGBA1C 6.4 07/29/2023     How often do you need to have someone help you when you read instructions, pamphlets, or other written materials from your doctor or  pharmacy?: 1 - Never  Interpreter Needed?: No  Information entered by :: Verdie Saba, CMA   Activities of Daily Living     04/06/2024    3:07 PM 07/13/2023    1:41 PM  In your present state of health, do you have any difficulty performing the following activities:  Hearing? 0 0  Vision? 0 0  Difficulty concentrating or making decisions? 0 0  Walking or climbing stairs? 0 0  Dressing or bathing? 0 0  Doing errands, shopping? 0 0  Preparing Food and eating ? N N  Using the Toilet? N N  In the past six months, have you accidently leaked urine? N N  Do you have problems with loss of bowel control? N N  Managing your Medications? N N  Managing your Finances? N N  Housekeeping or managing your Housekeeping? N N    Patient Care Team: Plotnikov, Karlynn GAILS, MD as PCP - General Burnard Debby LABOR, MD (Cardiology) Nandigam, Kavitha V, MD as Consulting Physician (Gastroenterology) Marget Lenis, MD as Consulting Physician (Obstetrics and Gynecology) Leslee Reusing, MD as Consulting Physician (Ophthalmology)  I have updated your Care Teams any recent Medical Services you may have received from other providers in the past year.     Assessment:   This is a routine wellness examination for Denise Donaldson.  Hearing/Vision screen Hearing Screening - Comments:: Denies hearing difficulties   Vision Screening - Comments:: Wears rx glasses - up to date with routine eye exams with Dr Leslee   Goals Addressed               This Visit's Progress     Patient Stated (pt-stated)        Patient stated that she plans to continue to exercise - Zumba and continue walking dogs       Depression Screen     04/06/2024    3:08 PM 10/14/2023    1:20 PM 07/13/2023  1:59 PM 03/17/2023    1:20 PM 12/15/2022    1:20 PM 09/18/2022    1:14 PM 09/18/2022    1:13 PM  PHQ 2/9 Scores  PHQ - 2 Score 0 0 0 0 0 0 0  PHQ- 9 Score 0  0   0     Fall Risk     04/06/2024    3:07 PM 10/14/2023    1:20 PM  07/13/2023    1:56 PM 03/17/2023    1:20 PM 12/15/2022    1:20 PM  Fall Risk   Falls in the past year? 0 0 0 0 0  Number falls in past yr: 0 0 0 0 0  Injury with Fall? 0 0 0 0 0  Risk for fall due to : No Fall Risks No Fall Risks No Fall Risks No Fall Risks No Fall Risks  Follow up Falls evaluation completed;Falls prevention discussed Falls evaluation completed Falls prevention discussed;Falls evaluation completed Falls evaluation completed Falls evaluation completed    MEDICARE RISK AT HOME:  Medicare Risk at Home Any stairs in or around the home?: No If so, are there any without handrails?: No Home free of loose throw rugs in walkways, pet beds, electrical cords, etc?: Yes Adequate lighting in your home to reduce risk of falls?: Yes Life alert?: No Use of a cane, walker or w/c?: No Grab bars in the bathroom?: No Shower chair or bench in shower?: No Elevated toilet seat or a handicapped toilet?: No  TIMED UP AND GO:  Was the test performed?  No  Cognitive Function: 6CIT completed        04/06/2024    3:10 PM 07/13/2023    1:56 PM 07/15/2022    2:10 PM  6CIT Screen  What Year? 0 points 0 points 0 points  What month? 0 points 0 points 0 points  What time? 0 points 0 points 0 points  Count back from 20 0 points 0 points 0 points  Months in reverse 0 points 0 points 0 points  Repeat phrase 2 points 0 points 0 points  Total Score 2 points 0 points 0 points    Immunizations Immunization History  Administered Date(s) Administered   PFIZER(Purple Top)SARS-COV-2 Vaccination 12/01/2019, 12/25/2019, 08/04/2020   PNEUMOCOCCAL CONJUGATE-20 03/17/2023   Tdap 06/01/2016    Screening Tests Health Maintenance  Topic Date Due   FOOT EXAM  Never done   Diabetic kidney evaluation - Urine ACR  Never done   Hepatitis C Screening  Never done   DEXA SCAN  Never done   Colonoscopy  04/27/2022   MAMMOGRAM  10/23/2022   COVID-19 Vaccine (4 - 2024-25 season) 05/23/2023    OPHTHALMOLOGY EXAM  06/13/2024   HEMOGLOBIN A1C  07/13/2024   Diabetic kidney evaluation - eGFR measurement  01/11/2025   Medicare Annual Wellness (AWV)  04/06/2025   DTaP/Tdap/Td (2 - Td or Tdap) 06/01/2026   Pneumococcal Vaccine: 50+ Years  Completed   Hepatitis B Vaccines  Aged Out   HPV VACCINES  Aged Out   Meningococcal B Vaccine  Aged Out   INFLUENZA VACCINE  Discontinued   Zoster Vaccines- Shingrix  Discontinued    Health Maintenance  Health Maintenance Due  Topic Date Due   FOOT EXAM  Never done   Diabetic kidney evaluation - Urine ACR  Never done   Hepatitis C Screening  Never done   DEXA SCAN  Never done   Colonoscopy  04/27/2022   MAMMOGRAM  10/23/2022  COVID-19 Vaccine (4 - 2024-25 season) 05/23/2023   Health Maintenance Items Addressed:  Mammogram ordered, DEXA ordered, Labs Ordered: Hepatitis C Screening and Diabetic Kidney Urine ACR  Colonoscopy status: Scheduled w/Dr Nandigam for 04/17/2024.  Foot Exam: to be completed by PCP - appt on 04/12/2024  Additional Screening:  Vision Screening: Recommended annual ophthalmology exams for early detection of glaucoma and other disorders of the eye. Would you like a referral to an eye doctor? No    Dental Screening: Recommended annual dental exams for proper oral hygiene  Community Resource Referral / Chronic Care Management: CRR required this visit?  No   CCM required this visit?  No   Plan:    I have personally reviewed and noted the following in the patient's chart:   Medical and social history Use of alcohol, tobacco or illicit drugs  Current medications and supplements including opioid prescriptions. Patient is not currently taking opioid prescriptions. Functional ability and status Nutritional status Physical activity Advanced directives List of other physicians Hospitalizations, surgeries, and ER visits in previous 12 months Vitals Screenings to include cognitive, depression, and  falls Referrals and appointments  In addition, I have reviewed and discussed with patient certain preventive protocols, quality metrics, and best practice recommendations. A written personalized care plan for preventive services as well as general preventive health recommendations were provided to patient.   Verdie CHRISTELLA Saba, CMA   04/06/2024   After Visit Summary: (MyChart) Due to this being a telephonic visit, the after visit summary with patients personalized plan was offered to patient via MyChart   Notes: Nothing significant to report at this time.  Medical screening examination/treatment/procedure(s) were performed by non-physician practitioner and as supervising physician I was immediately available for consultation/collaboration.  I agree with above. Karlynn Noel, MD

## 2024-04-06 NOTE — Patient Instructions (Addendum)
 Denise Donaldson , Thank you for taking time out of your busy schedule to complete your Annual Wellness Visit with me. I enjoyed our conversation and look forward to speaking with you again next year. I, as well as your care team,  appreciate your ongoing commitment to your health goals. Please review the following plan we discussed and let me know if I can assist you in the future. Your Game plan/ To Do List    Referrals: If you haven't heard from the office you've been referred to, please reach out to them at the phone provided.  Mammogram ordered, DEXA ordered, Labs Ordered: Hepatitis C Screening and Diabetic Kidney Urine ACR Follow up Visits: Next Medicare AWV with our clinical staff: 04/09/2025   Have you seen your provider in the last 6 months (3 months if uncontrolled diabetes)? Yes Next Office Visit with your provider: 04/12/2024  Clinician Recommendations:  Aim for 30 minutes of exercise or brisk walking, 6-8 glasses of water, and 5 servings of fruits and vegetables each day.       This is a list of the screening recommended for you and due dates:  Health Maintenance  Topic Date Due   Complete foot exam   Never done   Yearly kidney health urinalysis for diabetes  Never done   Hepatitis C Screening  Never done   DEXA scan (bone density measurement)  Never done   Colon Cancer Screening  04/27/2022   Mammogram  10/23/2022   COVID-19 Vaccine (4 - 2024-25 season) 05/23/2023   Eye exam for diabetics  06/13/2024   Hemoglobin A1C  07/13/2024   Yearly kidney function blood test for diabetes  01/11/2025   Medicare Annual Wellness Visit  04/06/2025   DTaP/Tdap/Td vaccine (2 - Td or Tdap) 06/01/2026   Pneumococcal Vaccine for age over 41  Completed   Hepatitis B Vaccine  Aged Out   HPV Vaccine  Aged Out   Meningitis B Vaccine  Aged Out   Flu Shot  Discontinued   Zoster (Shingles) Vaccine  Discontinued    Advanced directives: (Declined) Advance directive discussed with you today. Even  though you declined this today, please call our office should you change your mind, and we can give you the proper paperwork for you to fill out. Advance Care Planning is important because it:  [x]  Makes sure you receive the medical care that is consistent with your values, goals, and preferences  [x]  It provides guidance to your family and loved ones and reduces their decisional burden about whether or not they are making the right decisions based on your wishes.  Follow the link provided in your after visit summary or read over the paperwork we have mailed to you to help you started getting your Advance Directives in place. If you need assistance in completing these, please reach out to us  so that we can help you!

## 2024-04-12 ENCOUNTER — Encounter: Payer: Self-pay | Admitting: Internal Medicine

## 2024-04-12 ENCOUNTER — Ambulatory Visit (INDEPENDENT_AMBULATORY_CARE_PROVIDER_SITE_OTHER): Admitting: Internal Medicine

## 2024-04-12 VITALS — BP 150/70 | HR 72 | Temp 98.2°F | Ht 62.0 in | Wt 185.0 lb

## 2024-04-12 DIAGNOSIS — E1169 Type 2 diabetes mellitus with other specified complication: Secondary | ICD-10-CM

## 2024-04-12 DIAGNOSIS — R42 Dizziness and giddiness: Secondary | ICD-10-CM | POA: Diagnosis not present

## 2024-04-12 DIAGNOSIS — R609 Edema, unspecified: Secondary | ICD-10-CM | POA: Diagnosis not present

## 2024-04-12 DIAGNOSIS — I10 Essential (primary) hypertension: Secondary | ICD-10-CM | POA: Diagnosis not present

## 2024-04-12 DIAGNOSIS — K2270 Barrett's esophagus without dysplasia: Secondary | ICD-10-CM

## 2024-04-12 DIAGNOSIS — R5383 Other fatigue: Secondary | ICD-10-CM | POA: Diagnosis not present

## 2024-04-12 DIAGNOSIS — K2100 Gastro-esophageal reflux disease with esophagitis, without bleeding: Secondary | ICD-10-CM

## 2024-04-12 DIAGNOSIS — Z7984 Long term (current) use of oral hypoglycemic drugs: Secondary | ICD-10-CM

## 2024-04-12 DIAGNOSIS — E669 Obesity, unspecified: Secondary | ICD-10-CM | POA: Diagnosis not present

## 2024-04-12 DIAGNOSIS — R739 Hyperglycemia, unspecified: Secondary | ICD-10-CM

## 2024-04-12 LAB — URINALYSIS, ROUTINE W REFLEX MICROSCOPIC
Bilirubin Urine: NEGATIVE
Hgb urine dipstick: NEGATIVE
Ketones, ur: NEGATIVE
Nitrite: NEGATIVE
RBC / HPF: NONE SEEN (ref 0–?)
Specific Gravity, Urine: 1.01 (ref 1.000–1.030)
Total Protein, Urine: NEGATIVE
Urine Glucose: NEGATIVE
Urobilinogen, UA: 0.2 (ref 0.0–1.0)
pH: 6 (ref 5.0–8.0)

## 2024-04-12 LAB — CBC WITH DIFFERENTIAL/PLATELET
Basophils Absolute: 0.1 K/uL (ref 0.0–0.1)
Basophils Relative: 1.4 % (ref 0.0–3.0)
Eosinophils Absolute: 0.1 K/uL (ref 0.0–0.7)
Eosinophils Relative: 1.9 % (ref 0.0–5.0)
HCT: 44.3 % (ref 36.0–46.0)
Hemoglobin: 14.9 g/dL (ref 12.0–15.0)
Lymphocytes Relative: 39.8 % (ref 12.0–46.0)
Lymphs Abs: 2.1 K/uL (ref 0.7–4.0)
MCHC: 33.6 g/dL (ref 30.0–36.0)
MCV: 92.2 fl (ref 78.0–100.0)
Monocytes Absolute: 0.4 K/uL (ref 0.1–1.0)
Monocytes Relative: 7.4 % (ref 3.0–12.0)
Neutro Abs: 2.7 K/uL (ref 1.4–7.7)
Neutrophils Relative %: 49.5 % (ref 43.0–77.0)
Platelets: 359 K/uL (ref 150.0–400.0)
RBC: 4.81 Mil/uL (ref 3.87–5.11)
RDW: 13.2 % (ref 11.5–15.5)
WBC: 5.4 K/uL (ref 4.0–10.5)

## 2024-04-12 LAB — MICROALBUMIN / CREATININE URINE RATIO
Creatinine,U: 70.5 mg/dL
Microalb Creat Ratio: 24.6 mg/g (ref 0.0–30.0)
Microalb, Ur: 1.7 mg/dL (ref 0.0–1.9)

## 2024-04-12 LAB — COMPREHENSIVE METABOLIC PANEL WITH GFR
ALT: 47 U/L — ABNORMAL HIGH (ref 0–35)
AST: 30 U/L (ref 0–37)
Albumin: 4.3 g/dL (ref 3.5–5.2)
Alkaline Phosphatase: 58 U/L (ref 39–117)
BUN: 10 mg/dL (ref 6–23)
CO2: 22 meq/L (ref 19–32)
Calcium: 9.5 mg/dL (ref 8.4–10.5)
Chloride: 106 meq/L (ref 96–112)
Creatinine, Ser: 0.68 mg/dL (ref 0.40–1.20)
GFR: 88.3 mL/min (ref >60.00)
Glucose, Bld: 128 mg/dL — ABNORMAL HIGH (ref 70–99)
Potassium: 3.9 meq/L (ref 3.5–5.1)
Sodium: 137 meq/L (ref 135–145)
Total Bilirubin: 0.4 mg/dL (ref 0.2–1.2)
Total Protein: 7.7 g/dL (ref 6.0–8.3)

## 2024-04-12 LAB — HEMOGLOBIN A1C: Hgb A1c MFr Bld: 6.6 % — ABNORMAL HIGH (ref 4.6–6.5)

## 2024-04-12 LAB — LIPID PANEL
Cholesterol: 252 mg/dL — ABNORMAL HIGH (ref 0–200)
HDL: 75.1 mg/dL (ref >39.00)
LDL Cholesterol: 146 mg/dL — ABNORMAL HIGH (ref 0–99)
NonHDL: 176.82
Total CHOL/HDL Ratio: 3
Triglycerides: 153 mg/dL — ABNORMAL HIGH (ref 0.0–149.0)
VLDL: 30.6 mg/dL (ref 0.0–40.0)

## 2024-04-12 LAB — TSH: TSH: 1.5 u[IU]/mL (ref 0.35–5.50)

## 2024-04-12 NOTE — Assessment & Plan Note (Signed)
Bystolic - too $$$ Start Zebeta 5 mg/d

## 2024-04-12 NOTE — Assessment & Plan Note (Signed)
Cont on Metformin Check A1c

## 2024-04-12 NOTE — Assessment & Plan Note (Signed)
F/u w/Dr Silverio Decamp On Protonix  Potential benefits of a long term PPI use as well as potential risks  and complications were explained to the patient and were aknowledged.

## 2024-04-12 NOTE — Assessment & Plan Note (Signed)
Worse Labs 

## 2024-04-12 NOTE — Assessment & Plan Note (Addendum)
  On Norvasc  Zebeta  5 mg/d

## 2024-04-12 NOTE — Assessment & Plan Note (Signed)
 On Metformin - not every day Check A1c Pt declined Rybelsus, Marcelline Deist

## 2024-04-12 NOTE — Assessment & Plan Note (Signed)
?  Chronic  ?Cont on Protonix ?

## 2024-04-12 NOTE — Progress Notes (Signed)
 Subjective:  Patient ID: Denise Donaldson, female    DOB: 08-Jun-1956  Age: 68 y.o. MRN: [id number]  CC: Medical Management of Chronic Issues (3 mnth f/u, Pt states she has been having issues with dizziness and BP not being stable with being elevated at times and sometimes low )   HPI Denise Donaldson presents for HTN, GERD, DM2  Outpatient Medications Prior to Visit  Medication Sig Dispense Refill   amLODipine  (NORVASC ) 2.5 MG tablet Take 1 tablet (2.5 mg total) by mouth 2 (two) times daily. 180 tablet 3   b complex vitamins tablet Take 1 tablet by mouth daily. 100 tablet 3   Cholecalciferol  (VITAMIN D3) 50 MCG (2000 UT) CAPS Take 1 capsule (2,000 Units total) by mouth daily. 100 capsule 3   cloNIDine  (CATAPRES ) 0.1 MG tablet Take 1 tablet (0.1 mg total) by mouth 2 (two) times daily as needed (for blood pressure over 180). 60 tablet 1   diphenhydrAMINE  (BENADRYL  ALLERGY) 25 MG tablet Take 1 tablet (25 mg total) by mouth every 6 (six) hours as needed. 30 tablet 0   metFORMIN  (GLUCOPHAGE ) 500 MG tablet Take 1 tablet (500 mg total) by mouth daily with breakfast. 90 tablet 3   nebivolol  (BYSTOLIC ) 10 MG tablet Take 5 mg by mouth 2 (two) times daily.     triamcinolone  ointment (KENALOG ) 0.5 % Apply 1 Application topically 4 (four) times daily. 120 g 2   pantoprazole  (PROTONIX ) 40 MG tablet Take 1 tablet (40 mg total) by mouth daily. PRN (Patient not taking: Reported on 04/12/2024) 90 tablet 3   No facility-administered medications prior to visit.    ROS: Review of Systems  Constitutional:  Positive for unexpected weight change. Negative for activity change, appetite change, chills and fatigue.  HENT:  Negative for congestion, mouth sores and sinus pressure.   Eyes:  Negative for visual disturbance.  Respiratory:  Negative for cough and chest tightness.   Gastrointestinal:  Negative for abdominal pain and nausea.  Genitourinary:  Negative for difficulty urinating, frequency and  vaginal pain.  Musculoskeletal:  Negative for back pain and gait problem.  Skin:  Negative for pallor and rash.  Neurological:  Positive for dizziness. Negative for tremors, weakness, numbness and headaches.  Hematological:  Does not bruise/bleed easily.  Psychiatric/Behavioral:  Negative for confusion, sleep disturbance and suicidal ideas. The patient is not nervous/anxious.     Objective:  BP (!) 150/70   Pulse 72   Temp 98.2 F (36.8 C) (Oral)   Ht 5' 2 (1.575 m)   Wt 185 lb (83.9 kg)   SpO2 96%   BMI 33.84 kg/m   BP Readings from Last 3 Encounters:  04/12/24 (!) 150/70  01/12/24 112/68  12/02/23 (!) 142/60    Wt Readings from Last 3 Encounters:  04/12/24 185 lb (83.9 kg)  04/06/24 180 lb (81.6 kg)  04/03/24 180 lb (81.6 kg)    Physical Exam Constitutional:      General: She is not in acute distress.    Appearance: She is well-developed. She is obese.  HENT:     Head: Normocephalic.     Right Ear: External ear normal.     Left Ear: External ear normal.     Nose: Nose normal.  Eyes:     General:        Right eye: No discharge.        Left eye: No discharge.     Conjunctiva/sclera: Conjunctivae normal.     Pupils: Pupils are equal,  round, and reactive to light.  Neck:     Thyroid : No thyromegaly.     Vascular: No JVD.     Trachea: No tracheal deviation.  Cardiovascular:     Rate and Rhythm: Normal rate and regular rhythm.     Heart sounds: Normal heart sounds.  Pulmonary:     Effort: No respiratory distress.     Breath sounds: No stridor. No wheezing.  Abdominal:     General: Bowel sounds are normal. There is no distension.     Palpations: Abdomen is soft. There is no mass.     Tenderness: There is no abdominal tenderness. There is no guarding or rebound.  Musculoskeletal:        General: No tenderness.     Cervical back: Normal range of motion and neck supple. No rigidity.     Right lower leg: No edema.     Left lower leg: No edema.   Lymphadenopathy:     Cervical: No cervical adenopathy.  Skin:    Findings: No erythema or rash.  Neurological:     Cranial Nerves: No cranial nerve deficit.     Motor: No abnormal muscle tone.     Coordination: Coordination normal.     Deep Tendon Reflexes: Reflexes normal.  Psychiatric:        Behavior: Behavior normal.        Thought Content: Thought content normal.        Judgment: Judgment normal.     Lab Results  Component Value Date   WBC 6.5 12/15/2022   HGB 14.8 12/15/2022   HCT 43.9 12/15/2022   PLT 384.0 12/15/2022   GLUCOSE 96 01/12/2024   CHOL 244 (H) 03/10/2022   TRIG 140.0 03/10/2022   HDL 67.90 03/10/2022   LDLCALC 148 (H) 03/10/2022   ALT 33 01/12/2024   AST 25 01/12/2024   NA 138 01/12/2024   K 4.1 01/12/2024   CL 110 01/12/2024   CREATININE 0.62 01/12/2024   BUN 12 01/12/2024   CO2 21 01/12/2024   TSH 1.28 07/29/2023   HGBA1C 6.3 01/12/2024    US  ELASTOGRAPHY LIVER Result Date: 12/24/2023 CLINICAL DATA:  Elevated liver enzymes EXAM: US  ELASTOGRAPHY HEPATIC TECHNIQUE: Sonography of the liver was performed. In addition, ultrasound elastography evaluation of the liver was performed. A region of interest was placed within the right lobe of the liver. Following application of a compressive sonographic pulse, tissue compressibility was assessed. Multiple assessments were performed at the selected site. Median tissue compressibility was determined. Previously, hepatic stiffness was assessed by shear wave velocity. Based on recently published Society of Radiologists in Ultrasound consensus article, reporting is now recommended to be performed in the SI units of pressure (kiloPascals) representing hepatic stiffness/elasticity. The obtained result is compared to the published reference standards. (cACLD = compensated Advanced Chronic Liver Disease) COMPARISON:  Ultrasound 10/21/2023.  HIDA scan 11/05/2023. FINDINGS: Liver: Diffusely echogenic hepatic parenchyma  consistent with fatty liver infiltration. With this level of echogenicity evaluation for underlying mass lesion is limited and if needed follow-up contrast CT or MRI as clinically appropriate. Portal vein is patent on color Doppler imaging with normal direction of blood flow towards the liver. Some limitation with overlapping bowel gas and soft tissue. ULTRASOUND HEPATIC ELASTOGRAPHY Device: Siemens Helix VTQ Patient position: Supine Transducer: DAX Number of measurements: 12 Hepatic segment:  8 Median kPa: 4.2 IQR: 1.8 IQR/Median kPa ratio: 0.41 Data quality: IQR/Median kPa ratio of 0.3 or greater indicates reduced accuracy Diagnostic category:  <  or = 5 kPa: high probability of being normal The use of hepatic elastography is applicable to patients with viral hepatitis and non-alcoholic fatty liver disease. At this time, there is insufficient data for the referenced cut-off values and use in other causes of liver disease, including alcoholic liver disease. Patients, however, may be assessed by elastography and serve as their own reference standard/baseline. In patients with non-alcoholic liver disease, the values suggesting compensated advanced chronic liver disease (cACLD) may be lower, and patients may need additional testing with elasticity results of 7-9 kPa. Please note that abnormal hepatic elasticity and shear wave velocities may also be identified in clinical settings other than with hepatic fibrosis, such as: acute hepatitis, elevated right heart and central venous pressures including use of beta blockers, veno-occlusive disease (Budd-Chiari), infiltrative processes such as mastocytosis/amyloidosis/infiltrative tumor/lymphoma, extrahepatic cholestasis, with hyperemia in the post-prandial state, and with liver transplantation. Correlation with patient history, laboratory data, and clinical condition recommended. Diagnostic Categories: < or =5 kPa: high probability of being normal < or =9 kPa: in the absence  of other known clinical signs, rules out cACLD >9 kPa and ?13 kPa: suggestive of cACLD, but needs further testing >13 kPa: highly suggestive of cACLD > or =17 kPa: highly suggestive of cACLD with an increased probability of clinically significant portal hypertension IMPRESSION: ULTRASOUND LIVER: Fatty liver infiltration. ULTRASOUND HEPATIC ELASTOGRAPHY: Median kPa:  4.2 Diagnostic category:  1.8 In the setting of elevated liver function tests, non-fasting state, or vascular congestion, the stage of liver fibrosis may be overestimated. In some patients with NAFLD, the cut-off values for cACLD may be lower (7-9 kPa). In causes other than viral hepatitis and NAFLD, the cut-off values are not well established. Electronically Signed   By: Ranell Bring M.D.   On: 12/24/2023 18:24    Assessment & Plan:   Problem List Items Addressed This Visit     Barrett's esophagus   F/u w/Dr Shila On Protonix   Potential benefits of a long term PPI use as well as potential risks  and complications were explained to the patient and were aknowledged.      Edema   Bystolic  - too $$$ StarZebeta  5 mg/d      Essential hypertension    On Norvasc  Zebeta  5 mg/d      Relevant Orders   TSH   CBC with Differential/Platelet   Comprehensive metabolic panel with GFR   Fatigue   Worse Labs      Relevant Orders   TSH   CBC with Differential/Platelet   GERD (gastroesophageal reflux disease)   Chronic Cont on Protonix       Hyperglycemia   Cont on Metformin  Check A1c      Type 2 diabetes mellitus with obesity (HCC) - Primary   On Metformin  - not every day Check A1c Pt declined Rybelsus , Farxiga      Relevant Orders   TSH   Urinalysis   CBC with Differential/Platelet   Lipid panel   Comprehensive metabolic panel with GFR   Hemoglobin A1c   Microalbumin / creatinine urine ratio   Vertigo   BPV B-D exercise         No orders of the defined types were placed in this encounter.      Follow-up: Return in about 3 months (around 07/13/2024) for a follow-up visit.  Marolyn Noel, MD

## 2024-04-12 NOTE — Assessment & Plan Note (Signed)
 BPV B-D exercise

## 2024-04-13 ENCOUNTER — Ambulatory Visit: Payer: Self-pay | Admitting: Internal Medicine

## 2024-04-17 ENCOUNTER — Encounter: Payer: Self-pay | Admitting: Gastroenterology

## 2024-04-17 ENCOUNTER — Ambulatory Visit (AMBULATORY_SURGERY_CENTER): Admitting: Gastroenterology

## 2024-04-17 VITALS — BP 137/76 | HR 58 | Temp 97.2°F | Resp 13 | Ht 62.0 in | Wt 180.4 lb

## 2024-04-17 DIAGNOSIS — I10 Essential (primary) hypertension: Secondary | ICD-10-CM | POA: Diagnosis not present

## 2024-04-17 DIAGNOSIS — K635 Polyp of colon: Secondary | ICD-10-CM

## 2024-04-17 DIAGNOSIS — D122 Benign neoplasm of ascending colon: Secondary | ICD-10-CM | POA: Diagnosis not present

## 2024-04-17 DIAGNOSIS — K648 Other hemorrhoids: Secondary | ICD-10-CM

## 2024-04-17 DIAGNOSIS — R7303 Prediabetes: Secondary | ICD-10-CM | POA: Diagnosis not present

## 2024-04-17 DIAGNOSIS — K5730 Diverticulosis of large intestine without perforation or abscess without bleeding: Secondary | ICD-10-CM

## 2024-04-17 DIAGNOSIS — D128 Benign neoplasm of rectum: Secondary | ICD-10-CM | POA: Diagnosis not present

## 2024-04-17 DIAGNOSIS — D123 Benign neoplasm of transverse colon: Secondary | ICD-10-CM

## 2024-04-17 DIAGNOSIS — Z1211 Encounter for screening for malignant neoplasm of colon: Secondary | ICD-10-CM

## 2024-04-17 DIAGNOSIS — K644 Residual hemorrhoidal skin tags: Secondary | ICD-10-CM

## 2024-04-17 DIAGNOSIS — E669 Obesity, unspecified: Secondary | ICD-10-CM | POA: Diagnosis not present

## 2024-04-17 MED ORDER — SODIUM CHLORIDE 0.9 % IV SOLN: 500.0000 mL | INTRAVENOUS | Status: DC

## 2024-04-17 NOTE — Patient Instructions (Signed)
Resume previous diet and medications. Awaiting pathology results. Repeat Colonoscopy date to be determined based on pathology results.  YOU HAD AN ENDOSCOPIC PROCEDURE TODAY AT THE [facility]:   Refer to the procedure report that was given to you for any specific questions about what was found during the examination.  If the procedure report does not answer your questions, please call your gastroenterologist to clarify.  If you requested that your care partner not be given the details of your procedure findings, then the procedure report has been included in a sealed envelope for you to review at your convenience later.  YOU SHOULD EXPECT: Some feelings of bloating in the abdomen. Passage of more gas than usual.  Walking can help get rid of the air that was put into your GI tract during the procedure and reduce the bloating. If you had a lower endoscopy (such as a colonoscopy or flexible sigmoidoscopy) you may notice spotting of blood in your stool or on the toilet paper. If you underwent a bowel prep for your procedure, you may not have a normal bowel movement for a few days.  Please Note:  You might notice some irritation and congestion in your nose or some drainage.  This is from the oxygen used during your procedure.  There is no need for concern and it should clear up in a day or so.  SYMPTOMS TO REPORT IMMEDIATELY:  Following lower endoscopy (colonoscopy or flexible sigmoidoscopy):  Excessive amounts of blood in the stool  Significant tenderness or worsening of abdominal pains  Swelling of the abdomen that is new, acute  Fever of 100F or higher   For urgent or emergent issues, a gastroenterologist can be reached at any hour by calling [phone]. Do not use MyChart messaging for urgent concerns.    DIET:  We do recommend a small meal at first, but then you may proceed to your regular diet.  Drink plenty of fluids but you should avoid alcoholic beverages for 24  hours.  ACTIVITY:  You should plan to take it easy for the rest of today and you should NOT DRIVE or use heavy machinery until tomorrow (because of the sedation medicines used during the test).    FOLLOW UP: Our staff will call the number listed on your records the next business day following your procedure.  We will call around 7:15- 8:00 am to check on you and address any questions or concerns that you may have regarding the information given to you following your procedure. If we do not reach you, we will leave a message.     If any biopsies were taken you will be contacted by phone or by letter within the next 1-3 weeks.  Please call us at [phone] if you have not heard about the biopsies in 3 weeks.    SIGNATURES/CONFIDENTIALITY: You and/or your care partner have signed paperwork which will be entered into your electronic medical record.  These signatures attest to the fact that that the information above on your After Visit Summary has been reviewed and is understood.  Full responsibility of the confidentiality of this discharge information lies with you and/or your care-partner. 

## 2024-04-17 NOTE — Progress Notes (Signed)
 Report given to PACU, vss

## 2024-04-17 NOTE — Op Note (Signed)
  [facility] Patient Name: Denise Donaldson Procedure Date: 04/17/2024 3:46 PM MRN: [id number] Endoscopist: Gustav ALONSO Mcgee , MD, [id number] Age: 68 Referring MD:  Date of Birth: 1956-01-22 Gender: Female Account #: [id number] Procedure:                Colonoscopy Indications:              Screening for colorectal malignant neoplasm Medicines:                Monitored Anesthesia Care Procedure:                Pre-Anesthesia Assessment:                           - Prior to the procedure, a History and Physical                            was performed, and patient medications and                            allergies were reviewed. The patient's tolerance of                            previous anesthesia was also reviewed. The risks                            and benefits of the procedure and the sedation                            options and risks were discussed with the patient.                            All questions were answered, and informed consent                            was obtained. Prior Anticoagulants: The patient has                            taken no anticoagulant or antiplatelet agents. ASA                            Grade Assessment: III - A patient with severe                            systemic disease. After reviewing the risks and                            benefits, the patient was deemed in satisfactory                            condition to undergo the procedure.                           After obtaining informed consent, the colonoscope  was passed under direct vision. Throughout the                            procedure, the patient's blood pressure, pulse, and                            oxygen saturations were monitored continuously. The                            Olympus Scope SN [id number] was introduced through the                            anus and advanced to the the cecum, identified by                             appendiceal orifice and ileocecal valve. The                            colonoscopy was performed without difficulty. The                            patient tolerated the procedure well. The quality                            of the bowel preparation was good. The ileocecal                            valve, appendiceal orifice, and rectum were                            photographed. Scope In: 4:04:36 PM Scope Out: 4:18:36 PM Scope Withdrawal Time: 0 hours 10 minutes 46 seconds  Total Procedure Duration: 0 hours 14 minutes 0 seconds  Findings:                 The perianal and digital rectal examinations were                            normal.                           Three sessile polyps were found in the rectum,                            transverse colon and ascending colon. The polyps                            were 3 to 10 mm in size. These polyps were removed                            with a cold snare. Resection and retrieval were                            complete.  Scattered small-mouthed diverticula were found in                            the sigmoid colon and descending colon.                           Non-bleeding external and internal hemorrhoids were                            found during retroflexion. The hemorrhoids were                            medium-sized. Complications:            No immediate complications. Estimated Blood Loss:     Estimated blood loss was minimal. Impression:               - Three 3 to 10 mm polyps in the rectum, in the                            transverse colon and in the ascending colon,                            removed with a cold snare. Resected and retrieved.                           - Diverticulosis in the sigmoid colon and in the                            descending colon.                           - Non-bleeding external and internal hemorrhoids. Recommendation:           - Patient has a contact number  available for                            emergencies. The signs and symptoms of potential                            delayed complications were discussed with the                            patient. Return to normal activities tomorrow.                            Written discharge instructions were provided to the                            patient.                           - Resume previous diet.                           - Continue present medications.                           -  Await pathology results.                           - Repeat colonoscopy in 3 - 5 years for                            surveillance based on pathology results. Verdia Bolt V. Burnham Trost, MD 04/17/2024 4:23:16 PM This report has been signed electronically.

## 2024-04-17 NOTE — Progress Notes (Signed)
 [facility] History and Physical   Primary Care Physician:  Plotnikov, Karlynn GAILS, MD   Reason for Procedure:  Colorectal cancer screening  Plan:    Screening colonoscopy with possible interventions as needed     HPI: Denise Donaldson is a very pleasant 68 y.o. female here for screening colonoscopy. Denies any nausea, vomiting, abdominal pain, melena or bright red blood per rectum  The risks and benefits as well as alternatives of endoscopic procedure(s) have been discussed and reviewed. All questions answered. The patient agrees to proceed.    Past Medical History:  Diagnosis Date   Barrett's esophagus    Chest pain    Costochondral chest pain 2005   Left   Diverticulosis    Foot fracture, right    Dr Lucilla   Foot swelling    GERD (gastroesophageal reflux disease)    Dr Obie, EGD 2011 WNL   Head ache    Helicobacter pylori gastritis    History of pneumonia    HTN (hypertension)    Hyperplastic colon polyp    Menopausal disorder    After 54 Dr Marget   Mini stroke    Numbness of lower limb    Numbness of upper limb    Obesity    Other fatigue    Pectus excavatum    Pre-diabetes    Spine pain    TIA (transient ischemic attack)    Venous insufficiency    Bilat LE   Vitamin D  deficiency     Past Surgical History:  Procedure Laterality Date   APPENDECTOMY     BREAST EXCISIONAL BIOPSY Left    left breast excisional biopsy 1980   CARDIAC CATHETERIZATION  2006   had double vision past cath   CESAREAN SECTION     1 time   COLONOSCOPY  04/27/2012   Procedure: COLONOSCOPY;  Surgeon: Princella CHRISTELLA Obie, MD;  Location: [facility];  Service: Endoscopy;  Laterality: N/A;   double vision  2006   past cardiac cath/ lasted 9 months and went away   ESOPHAGOGASTRODUODENOSCOPY  04/27/2012   Procedure: ESOPHAGOGASTRODUODENOSCOPY (EGD);  Surgeon: Princella CHRISTELLA Obie, MD;  Location: [facility];  Service: Endoscopy;  Laterality: N/A;   OVARIAN CYST REMOVAL      partial    Prior to Admission medications   Medication Sig Start Date End Date Taking? Authorizing Provider  amLODipine  (NORVASC ) 2.5 MG tablet Take 1 tablet (2.5 mg total) by mouth 2 (two) times daily. 03/17/23  Yes Plotnikov, Aleksei V, MD  b complex vitamins tablet Take 1 tablet by mouth daily. 02/23/19  Yes Plotnikov, Karlynn GAILS, MD  Cholecalciferol  (VITAMIN D3) 50 MCG (2000 UT) CAPS Take 1 capsule (2,000 Units total) by mouth daily. 12/15/22  Yes Plotnikov, Karlynn GAILS, MD  nebivolol  (BYSTOLIC ) 10 MG tablet Take 5 mg by mouth 2 (two) times daily. 10/15/23  Yes [provider]  cloNIDine  (CATAPRES ) 0.1 MG tablet Take 1 tablet (0.1 mg total) by mouth 2 (two) times daily as needed (for blood pressure over 180). 06/06/20   Plotnikov, Karlynn GAILS, MD  diphenhydrAMINE  (BENADRYL  ALLERGY) 25 MG tablet Take 1 tablet (25 mg total) by mouth every 6 (six) hours as needed. 01/12/24   Plotnikov, Aleksei V, MD  metFORMIN  (GLUCOPHAGE ) 500 MG tablet Take 1 tablet (500 mg total) by mouth daily with breakfast. 12/16/22   Plotnikov, Aleksei V, MD  pantoprazole  (PROTONIX ) 40 MG tablet Take 1 tablet (40 mg total) by mouth daily. PRN Patient not taking: Reported on 04/06/2024  01/08/22   Plotnikov, Aleksei V, MD  triamcinolone  ointment (KENALOG ) 0.5 % Apply 1 Application topically 4 (four) times daily. 01/12/24 01/11/25  Plotnikov, Karlynn GAILS, MD    Current Outpatient Medications  Medication Sig Dispense Refill   amLODipine  (NORVASC ) 2.5 MG tablet Take 1 tablet (2.5 mg total) by mouth 2 (two) times daily. 180 tablet 3   b complex vitamins tablet Take 1 tablet by mouth daily. 100 tablet 3   Cholecalciferol  (VITAMIN D3) 50 MCG (2000 UT) CAPS Take 1 capsule (2,000 Units total) by mouth daily. 100 capsule 3   nebivolol  (BYSTOLIC ) 10 MG tablet Take 5 mg by mouth 2 (two) times daily.     cloNIDine  (CATAPRES ) 0.1 MG tablet Take 1 tablet (0.1 mg total) by mouth 2 (two) times daily as needed (for blood pressure over 180). 60  tablet 1   diphenhydrAMINE  (BENADRYL  ALLERGY) 25 MG tablet Take 1 tablet (25 mg total) by mouth every 6 (six) hours as needed. 30 tablet 0   metFORMIN  (GLUCOPHAGE ) 500 MG tablet Take 1 tablet (500 mg total) by mouth daily with breakfast. 90 tablet 3   pantoprazole  (PROTONIX ) 40 MG tablet Take 1 tablet (40 mg total) by mouth daily. PRN (Patient not taking: Reported on 04/06/2024) 90 tablet 3   triamcinolone  ointment (KENALOG ) 0.5 % Apply 1 Application topically 4 (four) times daily. 120 g 2   Current Facility-Administered Medications  Medication Dose Route Frequency Provider Last Rate Last Admin   0.9 %  sodium chloride  infusion  500 mL Intravenous Continuous Jimena Wieczorek V, MD        Allergies as of 04/17/2024 - Review Complete 04/17/2024  Allergen Reaction Noted   Penicillin g Other (See Comments) 05/03/2019   Penicillins Other (See Comments) 09/18/2022   Coreg  [carvedilol ] Other (See Comments) 03/02/2018   Metoprolol tartrate  05/06/2009   Lisinopril Other (See Comments) 11/07/2020   Losartan  Other (See Comments) 06/01/2016   Semaglutide (0.25 or 0.5mg -dos) Nausea And Vomiting 01/08/2022    Family History  Problem Relation Age of Onset   Diabetes Mother    Gallstones Mother        deceased from stone   Jaundice Mother    Liver disease Mother    Prostate cancer Father    Heart disease Father    Hypertension Father    Heart failure Father    Hypertension Sister    Peptic Ulcer Sister    Heart disease Maternal Grandmother    Stroke Paternal Grandmother    Prostate cancer Paternal Grandfather 67   Esophageal cancer Paternal Grandfather    Diabetes Maternal Aunt    Colon cancer Paternal Uncle    Stomach cancer Paternal Uncle 47   Liver cancer Neg Hx    Rectal cancer Neg Hx     Social History   Socioeconomic History   Marital status: Married    Spouse name: Steffan   Number of children: 3   Years of education: Not on file   Highest education level: Not on file   Occupational History   Occupation: retired Regulatory affairs officer  Tobacco Use   Smoking status: Never   Smokeless tobacco: Never  Vaping Use   Vaping status: Never Used  Substance and Sexual Activity   Alcohol use: No   Drug use: No   Sexual activity: Yes    Birth control/protection: Post-menopausal  Other Topics Concern   Not on file  Social History Narrative   Daily Caffeine - 2/dayRegular Exercise -  NO   Lives  with husband and has 6 dogs and 1 cat.   Social Drivers of Corporate investment banker Strain: Low Risk  (04/06/2024)   Overall Financial Resource Strain (CARDIA)    Difficulty of Paying Living Expenses: Not hard at all  Food Insecurity: No Food Insecurity (04/06/2024)   Hunger Vital Sign    Worried About Running Out of Food in the Last Year: Never true    Ran Out of Food in the Last Year: Never true  Transportation Needs: No Transportation Needs (04/06/2024)   PRAPARE - Administrator, Civil Service (Medical): No    Lack of Transportation (Non-Medical): No  Physical Activity: Sufficiently Active (04/06/2024)   Exercise Vital Sign    Days of Exercise per Week: 7 days    Minutes of Exercise per Session: 60 min  Stress: No Stress Concern Present (04/06/2024)   [employer] of Occupational Health - Occupational Stress Questionnaire    Feeling of Stress: Not at all  Social Connections: Moderately Integrated (04/06/2024)   Social Connection and Isolation Panel    Frequency of Communication with Friends and Family: More than three times a week    Frequency of Social Gatherings with Friends and Family: Once a week    Attends Religious Services: More than 4 times per year    Active Member of [employer] or Organizations: No    Attends Banker Meetings: Never    Marital Status: Married  Catering manager Violence: Not At Risk (04/06/2024)   Humiliation, Afraid, Rape, and Kick questionnaire    Fear of Current or Ex-Partner: No    Emotionally Abused: No     Physically Abused: No    Sexually Abused: No    Review of Systems:  All other review of systems negative except as mentioned in the HPI.  Physical Exam: Vital signs in last 24 hours: BP (!) 143/109   Pulse 64   Temp (!) 97.2 F (36.2 C) (Skin)   Ht 5' 2 (1.575 m)   Wt 180 lb 6.4 oz (81.8 kg)   SpO2 97%   BMI 33.00 kg/m  General:   Alert, NAD Lungs:  Clear .   Heart:  Regular rate and rhythm Abdomen:  Soft, nontender and nondistended. Neuro/Psych:  Alert and cooperative. Normal mood and affect. A and O x 3  Reviewed labs, radiology imaging, old records and pertinent past GI work up  Patient is appropriate for planned procedure(s) and anesthesia in an ambulatory setting   K. Veena Zoeann Mol , MD [phone]

## 2024-04-17 NOTE — Progress Notes (Signed)
 Patient states there have been no changes to medical or surgical history since time of pre-visit.

## 2024-04-17 NOTE — Progress Notes (Signed)
 Called to room to assist during endoscopic procedure.  Patient ID and intended procedure confirmed with present staff. Received instructions for my participation in the procedure from the performing physician.

## 2024-04-18 ENCOUNTER — Telehealth: Payer: Self-pay | Admitting: *Deleted

## 2024-04-18 NOTE — Telephone Encounter (Signed)
  Follow up Call-     04/17/2024    3:11 PM 12/02/2023    1:45 PM  Call back number  Post procedure Call Back phone  # [phone]   Permission to leave phone message Yes Yes     Patient questions:  Do you have a fever, pain , or abdominal swelling? No. Pain Score  0 *  Have you tolerated food without any problems? Yes.    Have you been able to return to your normal activities? Yes.    Do you have any questions about your discharge instructions: Diet   No. Medications  No. Follow up visit  No.  Do you have questions or concerns about your Care? No.  Actions: * If pain score is 4 or above: No action needed, pain <4.

## 2024-04-18 NOTE — Telephone Encounter (Signed)
 Denise Donaldson

## 2024-04-20 LAB — SURGICAL PATHOLOGY

## 2024-07-13 ENCOUNTER — Ambulatory Visit: Admitting: Internal Medicine

## 2024-07-13 ENCOUNTER — Other Ambulatory Visit: Payer: Self-pay

## 2024-07-13 ENCOUNTER — Encounter: Payer: Self-pay | Admitting: Internal Medicine

## 2024-07-13 VITALS — HR 59 | Temp 98.2°F | Ht 62.0 in | Wt 183.6 lb

## 2024-07-13 DIAGNOSIS — I10 Essential (primary) hypertension: Secondary | ICD-10-CM

## 2024-07-13 DIAGNOSIS — E669 Obesity, unspecified: Secondary | ICD-10-CM

## 2024-07-13 DIAGNOSIS — E119 Type 2 diabetes mellitus without complications: Secondary | ICD-10-CM

## 2024-07-13 DIAGNOSIS — Z789 Other specified health status: Secondary | ICD-10-CM | POA: Diagnosis not present

## 2024-07-13 DIAGNOSIS — K2270 Barrett's esophagus without dysplasia: Secondary | ICD-10-CM | POA: Diagnosis not present

## 2024-07-13 DIAGNOSIS — E559 Vitamin D deficiency, unspecified: Secondary | ICD-10-CM

## 2024-07-13 DIAGNOSIS — Z7984 Long term (current) use of oral hypoglycemic drugs: Secondary | ICD-10-CM

## 2024-07-13 LAB — COMPREHENSIVE METABOLIC PANEL WITH GFR
ALT: 48 U/L — ABNORMAL HIGH (ref 0–35)
AST: 34 U/L (ref 0–37)
Albumin: 4.4 g/dL (ref 3.5–5.2)
Alkaline Phosphatase: 59 U/L (ref 39–117)
BUN: 8 mg/dL (ref 6–23)
CO2: 26 meq/L (ref 19–32)
Calcium: 9.6 mg/dL (ref 8.4–10.5)
Chloride: 105 meq/L (ref 96–112)
Creatinine, Ser: 0.62 mg/dL (ref 0.40–1.20)
GFR: 90.13 mL/min (ref >60.00)
Glucose, Bld: 89 mg/dL (ref 70–99)
Potassium: 4.4 meq/L (ref 3.5–5.1)
Sodium: 138 meq/L (ref 135–145)
Total Bilirubin: 0.4 mg/dL (ref 0.2–1.2)
Total Protein: 7.6 g/dL (ref 6.0–8.3)

## 2024-07-13 LAB — HEMOGLOBIN A1C: Hgb A1c MFr Bld: 6.5 % (ref 4.6–6.5)

## 2024-07-13 NOTE — Assessment & Plan Note (Signed)
 Try intermittent fasting; eating fish, eggs, milk products

## 2024-07-13 NOTE — Patient Instructions (Signed)
  Denise Donaldson      04/04/2024 [address] [place] [address]          Dear Ms. Zhao,  You have been scheduled for a colonoscopy with  Gastroenterology.  We wanted you to know how your procedure will be presented to your insurance company for payment. Your procedure is being performed in the [facility]; we are considered an Ambulatory Surgical Center.  The average, single procedure ranges from $1400 to $4000.  We cannot bill your procedure as an  In office charge. You will receive separate bills for Professional, Facility, Pathology and Anesthesia Charges.     Your colonoscopy will be classified as a:      Diagnostic or therapeutic Colonoscopy- presented to the office with past or present gastrointestinal symptoms, polyps, GI diseases, or anemias.     Surveillance or High-Risk Screening Colonoscopy- asymptomatic (no gastrointestinal symptoms either past or present), has a personal history of GI disease, personal and or family history of colon polyps and or cancer. This covers patients that have surveillance procedures every 1-7 years. Not all insurance companies will cover a surveillance or High-Risk screening at 100%.  Many insurance companies will cover this at 80% with the remainder the patient responsibility and not consider this a preventative screening.        X  Preventative Colonoscopy Screening- asymptomatic (no gastrointestinal symptoms either past or present). Patient is over the age of 79, has no personal or family history of GI disease, colon polyps, and or colon cancer. The patient has not undergone a colonoscopy within the last 10 years. Usually covered by most insurances at 100% every 10 years.    Your insurance may require you to meet a deductible before they will pay for your procedures. A deductible is a specified amount of money that the insured must pay before an insurance company will pay a claim. Co-insurance is the percentage of  costs that you pay after you meet your deductible.    Although this is not a guarantee of benefits, as a courtesy and for your information, we can share what was quoted from your insurance carrier for your ambulatory out-patient procedure with Kavitha Nandigam, MD.  Potentially, after your $ 450.00 copay, your procedure may be covered at 100 %.

## 2024-07-13 NOTE — Assessment & Plan Note (Signed)
F/u w/Dr Silverio Decamp On Protonix  Potential benefits of a long term PPI use as well as potential risks  and complications were explained to the patient and were aknowledged.

## 2024-07-13 NOTE — Addendum Note (Signed)
 Addended by: CLAUDENE BOBBETTE RAMAN on: 07/13/2024 02:40 PM   Modules accepted: Orders

## 2024-07-13 NOTE — Progress Notes (Signed)
 Subjective:  Patient ID: Denise Donaldson, female    DOB: 1956/02/19  Age: 69 y.o. MRN: [id number]  CC: Medical Management of Chronic Issues (3 Month follow up)   HPI Denise Donaldson presents for HTN, labile BP, DM, GERD  Outpatient Medications Prior to Visit  Medication Sig Dispense Refill   amLODipine  (NORVASC ) 2.5 MG tablet Take 1 tablet (2.5 mg total) by mouth 2 (two) times daily. 180 tablet 3   b complex vitamins tablet Take 1 tablet by mouth daily. 100 tablet 3   Cholecalciferol  (VITAMIN D3) 50 MCG (2000 UT) CAPS Take 1 capsule (2,000 Units total) by mouth daily. 100 capsule 3   cloNIDine  (CATAPRES ) 0.1 MG tablet Take 1 tablet (0.1 mg total) by mouth 2 (two) times daily as needed (for blood pressure over 180). 60 tablet 1   diphenhydrAMINE  (BENADRYL  ALLERGY) 25 MG tablet Take 1 tablet (25 mg total) by mouth every 6 (six) hours as needed. 30 tablet 0   metFORMIN  (GLUCOPHAGE ) 500 MG tablet Take 1 tablet (500 mg total) by mouth daily with breakfast. 90 tablet 3   nebivolol  (BYSTOLIC ) 10 MG tablet Take 5 mg by mouth 2 (two) times daily.     triamcinolone  ointment (KENALOG ) 0.5 % Apply 1 Application topically 4 (four) times daily. 120 g 2   pantoprazole  (PROTONIX ) 40 MG tablet Take 1 tablet (40 mg total) by mouth daily. PRN (Patient not taking: Reported on 07/13/2024) 90 tablet 3   No facility-administered medications prior to visit.    ROS: Review of Systems  Constitutional:  Positive for fatigue and unexpected weight change. Negative for activity change, appetite change and chills.  HENT:  Negative for congestion, mouth sores and sinus pressure.   Eyes:  Negative for visual disturbance.  Respiratory:  Negative for cough and chest tightness.   Gastrointestinal:  Negative for abdominal pain and nausea.  Genitourinary:  Negative for difficulty urinating, frequency and vaginal pain.  Musculoskeletal:  Negative for back pain and gait problem.  Skin:  Negative for pallor and  rash.  Neurological:  Negative for dizziness, tremors, weakness, numbness and headaches.  Psychiatric/Behavioral:  Negative for confusion and sleep disturbance. The patient is not nervous/anxious.     Objective:  Pulse (!) 59   Temp 98.2 F (36.8 C)   Ht 5' 2 (1.575 m)   Wt 183 lb 9.6 oz (83.3 kg)   SpO2 96%   BMI 33.58 kg/m   BP Readings from Last 3 Encounters:  04/17/24 137/76  04/12/24 (!) 150/70  01/12/24 112/68    Wt Readings from Last 3 Encounters:  07/13/24 183 lb 9.6 oz (83.3 kg)  04/17/24 180 lb 6.4 oz (81.8 kg)  04/12/24 185 lb (83.9 kg)    Physical Exam Constitutional:      General: She is not in acute distress.    Appearance: She is well-developed. She is obese.  HENT:     Head: Normocephalic.     Right Ear: External ear normal.     Left Ear: External ear normal.     Nose: Nose normal.  Eyes:     General:        Right eye: No discharge.        Left eye: No discharge.     Conjunctiva/sclera: Conjunctivae normal.     Pupils: Pupils are equal, round, and reactive to light.  Neck:     Thyroid : No thyromegaly.     Vascular: No JVD.     Trachea: No tracheal deviation.  Cardiovascular:     Rate and Rhythm: Normal rate and regular rhythm.     Heart sounds: Normal heart sounds.  Pulmonary:     Effort: No respiratory distress.     Breath sounds: No stridor. No wheezing.  Abdominal:     General: Bowel sounds are normal. There is no distension.     Palpations: Abdomen is soft. There is no mass.     Tenderness: There is no abdominal tenderness. There is no guarding or rebound.  Musculoskeletal:        General: No tenderness.     Cervical back: Normal range of motion and neck supple. No rigidity.  Lymphadenopathy:     Cervical: No cervical adenopathy.  Skin:    Findings: No erythema or rash.  Neurological:     Mental Status: She is oriented to person, place, and time.     Cranial Nerves: No cranial nerve deficit.     Motor: No abnormal muscle tone.      Coordination: Coordination normal.     Deep Tendon Reflexes: Reflexes normal.  Psychiatric:        Behavior: Behavior normal.        Thought Content: Thought content normal.        Judgment: Judgment normal.     Lab Results  Component Value Date   WBC 5.4 04/12/2024   HGB 14.9 04/12/2024   HCT 44.3 04/12/2024   PLT 359.0 04/12/2024   GLUCOSE 128 (H) 04/12/2024   CHOL 252 (H) 04/12/2024   TRIG 153.0 (H) 04/12/2024   HDL 75.10 04/12/2024   LDLCALC 146 (H) 04/12/2024   ALT 47 (H) 04/12/2024   AST 30 04/12/2024   NA 137 04/12/2024   K 3.9 04/12/2024   CL 106 04/12/2024   CREATININE 0.68 04/12/2024   BUN 10 04/12/2024   CO2 22 04/12/2024   TSH 1.50 04/12/2024   HGBA1C 6.6 (H) 04/12/2024   MICROALBUR 1.7 04/12/2024    US  ELASTOGRAPHY LIVER Result Date: 12/24/2023 CLINICAL DATA:  Elevated liver enzymes EXAM: US  ELASTOGRAPHY HEPATIC TECHNIQUE: Sonography of the liver was performed. In addition, ultrasound elastography evaluation of the liver was performed. A region of interest was placed within the right lobe of the liver. Following application of a compressive sonographic pulse, tissue compressibility was assessed. Multiple assessments were performed at the selected site. Median tissue compressibility was determined. Previously, hepatic stiffness was assessed by shear wave velocity. Based on recently published Society of Radiologists in Ultrasound consensus article, reporting is now recommended to be performed in the SI units of pressure (kiloPascals) representing hepatic stiffness/elasticity. The obtained result is compared to the published reference standards. (cACLD = compensated Advanced Chronic Liver Disease) COMPARISON:  Ultrasound 10/21/2023.  HIDA scan 11/05/2023. FINDINGS: Liver: Diffusely echogenic hepatic parenchyma consistent with fatty liver infiltration. With this level of echogenicity evaluation for underlying mass lesion is limited and if needed follow-up contrast CT or MRI  as clinically appropriate. Portal vein is patent on color Doppler imaging with normal direction of blood flow towards the liver. Some limitation with overlapping bowel gas and soft tissue. ULTRASOUND HEPATIC ELASTOGRAPHY Device: Siemens Helix VTQ Patient position: Supine Transducer: DAX Number of measurements: 12 Hepatic segment:  8 Median kPa: 4.2 IQR: 1.8 IQR/Median kPa ratio: 0.41 Data quality: IQR/Median kPa ratio of 0.3 or greater indicates reduced accuracy Diagnostic category:  < or = 5 kPa: high probability of being normal The use of hepatic elastography is applicable to patients with viral hepatitis and non-alcoholic fatty liver  disease. At this time, there is insufficient data for the referenced cut-off values and use in other causes of liver disease, including alcoholic liver disease. Patients, however, may be assessed by elastography and serve as their own reference standard/baseline. In patients with non-alcoholic liver disease, the values suggesting compensated advanced chronic liver disease (cACLD) may be lower, and patients may need additional testing with elasticity results of 7-9 kPa. Please note that abnormal hepatic elasticity and shear wave velocities may also be identified in clinical settings other than with hepatic fibrosis, such as: acute hepatitis, elevated right heart and central venous pressures including use of beta blockers, veno-occlusive disease (Budd-Chiari), infiltrative processes such as mastocytosis/amyloidosis/infiltrative tumor/lymphoma, extrahepatic cholestasis, with hyperemia in the post-prandial state, and with liver transplantation. Correlation with patient history, laboratory data, and clinical condition recommended. Diagnostic Categories: < or =5 kPa: high probability of being normal < or =9 kPa: in the absence of other known clinical signs, rules out cACLD >9 kPa and ?13 kPa: suggestive of cACLD, but needs further testing >13 kPa: highly suggestive of cACLD > or =17 kPa:  highly suggestive of cACLD with an increased probability of clinically significant portal hypertension IMPRESSION: ULTRASOUND LIVER: Fatty liver infiltration. ULTRASOUND HEPATIC ELASTOGRAPHY: Median kPa:  4.2 Diagnostic category:  1.8 In the setting of elevated liver function tests, non-fasting state, or vascular congestion, the stage of liver fibrosis may be overestimated. In some patients with NAFLD, the cut-off values for cACLD may be lower (7-9 kPa). In causes other than viral hepatitis and NAFLD, the cut-off values are not well established. Electronically Signed   By: Ranell Bring M.D.   On: 12/24/2023 18:24    Assessment & Plan:   Problem List Items Addressed This Visit     Barrett's esophagus   F/u w/Dr Shila On Protonix   Potential benefits of a long term PPI use as well as potential risks  and complications were explained to the patient and were aknowledged.      Essential hypertension - Primary    On Norvasc  Zebeta  5 mg/d      Type 2 diabetes mellitus in patient with obesity (HCC)   On Metformin  - not every day Check A1c Pt declined Rybelsus , Farxiga      Vegetarian diet   Try intermittent fasting; eating fish, eggs, milk products      Vitamin D  deficiency   Given prescription for weekly vitamin D .  After  vitamin D  prescription is finished, get Vit D3+K2  vitamin and take daily.  Continue with a B complex.  This should help with hair and nails. ...          No orders of the defined types were placed in this encounter.     Follow-up: Return in about 3 months (around 10/13/2024).  Marolyn Noel, MD

## 2024-07-13 NOTE — Assessment & Plan Note (Signed)
Better  OK to go back to work

## 2024-07-13 NOTE — Assessment & Plan Note (Signed)
 On Metformin - not every day Check A1c Pt declined Rybelsus, Marcelline Deist

## 2024-07-13 NOTE — Assessment & Plan Note (Signed)
  On Norvasc  Zebeta  5 mg/d

## 2024-07-13 NOTE — Assessment & Plan Note (Signed)
Given prescription for weekly vitamin D.  After  vitamin D prescription is finished, get "Vit D3+K2 " vitamin and take daily.  Continue with a B complex.  This should help with hair and nails. [name]..

## 2024-07-16 ENCOUNTER — Ambulatory Visit: Payer: Self-pay | Admitting: Internal Medicine

## 2024-07-24 ENCOUNTER — Ambulatory Visit: Payer: Self-pay | Admitting: Gastroenterology

## 2024-10-17 ENCOUNTER — Ambulatory Visit: Admitting: Internal Medicine

## 2024-10-17 ENCOUNTER — Encounter: Payer: Self-pay | Admitting: Internal Medicine

## 2024-10-17 VITALS — BP 118/72 | HR 62 | Temp 97.8°F | Ht 62.0 in | Wt 190.0 lb

## 2024-10-17 DIAGNOSIS — R739 Hyperglycemia, unspecified: Secondary | ICD-10-CM

## 2024-10-17 DIAGNOSIS — E559 Vitamin D deficiency, unspecified: Secondary | ICD-10-CM | POA: Diagnosis not present

## 2024-10-17 DIAGNOSIS — M545 Low back pain, unspecified: Secondary | ICD-10-CM | POA: Diagnosis not present

## 2024-10-17 DIAGNOSIS — E669 Obesity, unspecified: Secondary | ICD-10-CM

## 2024-10-17 DIAGNOSIS — I10 Essential (primary) hypertension: Secondary | ICD-10-CM

## 2024-10-17 DIAGNOSIS — G8929 Other chronic pain: Secondary | ICD-10-CM | POA: Diagnosis not present

## 2024-10-17 DIAGNOSIS — Z7984 Long term (current) use of oral hypoglycemic drugs: Secondary | ICD-10-CM | POA: Diagnosis not present

## 2024-10-17 DIAGNOSIS — E119 Type 2 diabetes mellitus without complications: Secondary | ICD-10-CM | POA: Diagnosis not present

## 2024-10-17 DIAGNOSIS — E66812 Obesity, class 2: Secondary | ICD-10-CM | POA: Diagnosis not present

## 2024-10-17 DIAGNOSIS — K2270 Barrett's esophagus without dysplasia: Secondary | ICD-10-CM | POA: Diagnosis not present

## 2024-10-17 DIAGNOSIS — Z6835 Body mass index (BMI) 35.0-35.9, adult: Secondary | ICD-10-CM

## 2024-10-17 DIAGNOSIS — K2100 Gastro-esophageal reflux disease with esophagitis, without bleeding: Secondary | ICD-10-CM | POA: Diagnosis not present

## 2024-10-17 LAB — COMPREHENSIVE METABOLIC PANEL WITH GFR
ALT: 48 U/L — ABNORMAL HIGH (ref 3–35)
AST: 34 U/L (ref 5–37)
Albumin: 4.4 g/dL (ref 3.5–5.2)
Alkaline Phosphatase: 62 U/L (ref 39–117)
BUN: 17 mg/dL (ref 6–23)
CO2: 24 meq/L (ref 19–32)
Calcium: 9.9 mg/dL (ref 8.4–10.5)
Chloride: 105 meq/L (ref 96–112)
Creatinine, Ser: 0.66 mg/dL (ref 0.40–1.20)
GFR: 88.62 mL/min
Glucose, Bld: 98 mg/dL (ref 70–99)
Potassium: 3.9 meq/L (ref 3.5–5.1)
Sodium: 139 meq/L (ref 135–145)
Total Bilirubin: 0.3 mg/dL (ref 0.2–1.2)
Total Protein: 7.9 g/dL (ref 6.0–8.3)

## 2024-10-17 LAB — HEMOGLOBIN A1C: Hgb A1c MFr Bld: 6.4 % (ref 4.6–6.5)

## 2024-10-17 NOTE — Assessment & Plan Note (Signed)
Better  OK to go back to work

## 2024-10-17 NOTE — Assessment & Plan Note (Signed)
 Better

## 2024-10-17 NOTE — Assessment & Plan Note (Signed)
 On Metformin - not every day Check A1c Pt declined Rybelsus, Denise Donaldson

## 2024-10-17 NOTE — Assessment & Plan Note (Signed)
Cont on Metformin Check A1c

## 2024-10-17 NOTE — Assessment & Plan Note (Signed)
F/u w/Dr Silverio Decamp On Protonix  Potential benefits of a long term PPI use as well as potential risks  and complications were explained to the patient and were aknowledged.

## 2024-10-17 NOTE — Assessment & Plan Note (Signed)
  On Norvasc  Zebeta  5 mg/d

## 2024-10-17 NOTE — Assessment & Plan Note (Signed)
Given prescription for weekly vitamin D.  After  vitamin D prescription is finished, get "Vit D3+K2 " vitamin and take daily.  Continue with a B complex.  This should help with hair and nails. [name]..

## 2024-10-17 NOTE — Assessment & Plan Note (Signed)
?  Chronic  ?Cont on Protonix ?

## 2024-10-18 ENCOUNTER — Ambulatory Visit: Payer: Self-pay | Admitting: Internal Medicine

## 2024-10-20 ENCOUNTER — Other Ambulatory Visit: Payer: Self-pay | Admitting: Internal Medicine

## 2024-10-26 ENCOUNTER — Ambulatory Visit: Payer: Self-pay
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
## 2024-10-27 ENCOUNTER — Encounter: Payer: Self-pay | Admitting: Internal Medicine

## 2024-10-27 ENCOUNTER — Ambulatory Visit: Admitting: Internal Medicine

## 2024-10-27 VITALS — BP 126/74 | HR 64 | Ht 62.0 in | Wt 190.0 lb

## 2024-10-27 DIAGNOSIS — Z789 Other specified health status: Secondary | ICD-10-CM

## 2024-10-27 DIAGNOSIS — E669 Obesity, unspecified: Secondary | ICD-10-CM

## 2024-10-27 DIAGNOSIS — R221 Localized swelling, mass and lump, neck: Secondary | ICD-10-CM

## 2024-10-27 MED ORDER — MUPIROCIN 2 % EX OINT: TOPICAL_OINTMENT | CUTANEOUS | 0 refills | Status: AC
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
## 2024-11-01 ENCOUNTER — Other Ambulatory Visit

## 2025-01-15 ENCOUNTER — Ambulatory Visit: Admitting: Internal Medicine

## 2025-04-09 ENCOUNTER — Ambulatory Visit
